# Patient Record
Sex: Female | Born: 1970
Health system: Southern US, Community
[De-identification: ages and names within clinical notes are randomized; demographics above are authoritative.]

## PROBLEM LIST (undated history)

## (undated) DIAGNOSIS — I341 Nonrheumatic mitral (valve) prolapse: Secondary | ICD-10-CM

## (undated) DIAGNOSIS — Z87442 Personal history of urinary calculi: Secondary | ICD-10-CM

## (undated) DIAGNOSIS — I10 Essential (primary) hypertension: Secondary | ICD-10-CM

## (undated) DIAGNOSIS — E282 Polycystic ovarian syndrome: Secondary | ICD-10-CM

## (undated) DIAGNOSIS — M1990 Unspecified osteoarthritis, unspecified site: Secondary | ICD-10-CM

## (undated) DIAGNOSIS — T7840XA Allergy, unspecified, initial encounter: Secondary | ICD-10-CM

## (undated) DIAGNOSIS — E119 Type 2 diabetes mellitus without complications: Secondary | ICD-10-CM

## (undated) DIAGNOSIS — K219 Gastro-esophageal reflux disease without esophagitis: Secondary | ICD-10-CM

## (undated) HISTORY — PX: KNEE SURGERY: SHX244

## (undated) HISTORY — PX: TUBAL LIGATION: SHX77

## (undated) HISTORY — PX: ROTATOR CUFF REPAIR: SHX139

## (undated) HISTORY — DX: Allergy, unspecified, initial encounter: T78.40XA

## (undated) HISTORY — DX: Type 2 diabetes mellitus without complications: E11.9

## (undated) HISTORY — PX: OTHER SURGICAL HISTORY: SHX169

## (undated) HISTORY — DX: Essential (primary) hypertension: I10

---

## 2004-09-15 ENCOUNTER — Ambulatory Visit: Payer: Self-pay | Admitting: Urology

## 2005-12-09 ENCOUNTER — Ambulatory Visit: Payer: Self-pay | Admitting: Unknown Physician Specialty

## 2006-05-11 ENCOUNTER — Ambulatory Visit: Payer: Self-pay | Admitting: Family Medicine

## 2006-05-31 ENCOUNTER — Ambulatory Visit: Payer: Self-pay | Admitting: Family Medicine

## 2006-05-31 LAB — CONVERTED CEMR LAB
CO2: 27 meq/L (ref 19–32)
Sodium: 140 meq/L (ref 135–145)
Total CHOL/HDL Ratio: 5.8
VLDL: 112 mg/dL — ABNORMAL HIGH (ref 0–40)

## 2006-06-16 ENCOUNTER — Ambulatory Visit: Payer: Self-pay | Admitting: Family Medicine

## 2007-03-06 ENCOUNTER — Ambulatory Visit: Payer: Self-pay | Admitting: Family Medicine

## 2007-03-06 DIAGNOSIS — H6980 Other specified disorders of Eustachian tube, unspecified ear: Secondary | ICD-10-CM | POA: Insufficient documentation

## 2007-03-06 DIAGNOSIS — J309 Allergic rhinitis, unspecified: Secondary | ICD-10-CM | POA: Insufficient documentation

## 2007-03-13 ENCOUNTER — Telehealth: Payer: Self-pay | Admitting: Family Medicine

## 2007-04-14 ENCOUNTER — Ambulatory Visit: Payer: Self-pay | Admitting: Obstetrics & Gynecology

## 2007-04-26 ENCOUNTER — Ambulatory Visit: Payer: Self-pay | Admitting: Family Medicine

## 2007-04-28 ENCOUNTER — Telehealth: Payer: Self-pay | Admitting: Family Medicine

## 2007-07-05 ENCOUNTER — Telehealth (INDEPENDENT_AMBULATORY_CARE_PROVIDER_SITE_OTHER): Payer: Self-pay | Admitting: *Deleted

## 2007-09-25 ENCOUNTER — Ambulatory Visit: Payer: Self-pay | Admitting: Cardiology

## 2007-09-25 ENCOUNTER — Other Ambulatory Visit: Payer: Self-pay

## 2007-09-25 ENCOUNTER — Encounter: Payer: Self-pay | Admitting: Maternal & Fetal Medicine

## 2007-10-09 ENCOUNTER — Encounter: Payer: Self-pay | Admitting: Maternal & Fetal Medicine

## 2007-11-06 ENCOUNTER — Ambulatory Visit: Payer: Self-pay | Admitting: Unknown Physician Specialty

## 2007-11-13 ENCOUNTER — Encounter: Payer: Self-pay | Admitting: Maternal & Fetal Medicine

## 2007-11-20 ENCOUNTER — Encounter: Payer: Self-pay | Admitting: Maternal & Fetal Medicine

## 2007-12-07 ENCOUNTER — Encounter: Payer: Self-pay | Admitting: Maternal & Fetal Medicine

## 2007-12-21 ENCOUNTER — Encounter: Payer: Self-pay | Admitting: Maternal and Fetal Medicine

## 2008-01-18 ENCOUNTER — Encounter: Payer: Self-pay | Admitting: Maternal & Fetal Medicine

## 2008-02-12 ENCOUNTER — Encounter: Payer: Self-pay | Admitting: Maternal & Fetal Medicine

## 2008-02-13 ENCOUNTER — Observation Stay: Payer: Self-pay | Admitting: Unknown Physician Specialty

## 2008-02-14 ENCOUNTER — Ambulatory Visit: Payer: Self-pay

## 2008-02-15 ENCOUNTER — Ambulatory Visit: Payer: Self-pay

## 2008-02-17 ENCOUNTER — Observation Stay: Payer: Self-pay

## 2008-02-19 ENCOUNTER — Encounter: Payer: Self-pay | Admitting: Maternal and Fetal Medicine

## 2008-02-22 ENCOUNTER — Observation Stay: Payer: Self-pay | Admitting: Unknown Physician Specialty

## 2008-02-26 ENCOUNTER — Inpatient Hospital Stay: Payer: Self-pay | Admitting: Obstetrics and Gynecology

## 2008-04-26 LAB — CONVERTED CEMR LAB: Pap Smear: NORMAL

## 2008-07-15 ENCOUNTER — Ambulatory Visit: Payer: Self-pay | Admitting: Obstetrics and Gynecology

## 2008-07-18 ENCOUNTER — Ambulatory Visit: Payer: Self-pay | Admitting: Obstetrics and Gynecology

## 2008-11-20 ENCOUNTER — Ambulatory Visit: Payer: Self-pay | Admitting: Family Medicine

## 2008-11-20 DIAGNOSIS — I10 Essential (primary) hypertension: Secondary | ICD-10-CM | POA: Insufficient documentation

## 2008-11-20 DIAGNOSIS — I152 Hypertension secondary to endocrine disorders: Secondary | ICD-10-CM | POA: Insufficient documentation

## 2008-12-04 ENCOUNTER — Telehealth: Payer: Self-pay | Admitting: Family Medicine

## 2009-04-16 ENCOUNTER — Telehealth: Payer: Self-pay | Admitting: Family Medicine

## 2009-06-24 ENCOUNTER — Ambulatory Visit: Payer: Self-pay | Admitting: Family Medicine

## 2009-12-30 ENCOUNTER — Ambulatory Visit: Payer: Self-pay | Admitting: Family Medicine

## 2010-01-06 LAB — CONVERTED CEMR LAB
AST: 21 units/L (ref 0–37)
Albumin: 4.3 g/dL (ref 3.5–5.2)
Alkaline Phosphatase: 46 units/L (ref 39–117)
BUN: 13 mg/dL (ref 6–23)
Bilirubin, Direct: 0.2 mg/dL (ref 0.0–0.3)
CO2: 30 meq/L (ref 19–32)
Calcium: 9.6 mg/dL (ref 8.4–10.5)
Creatinine, Ser: 0.7 mg/dL (ref 0.4–1.2)
Total Protein: 6.9 g/dL (ref 6.0–8.3)

## 2010-01-20 ENCOUNTER — Encounter: Payer: Self-pay | Admitting: Family Medicine

## 2010-01-20 ENCOUNTER — Ambulatory Visit: Payer: Self-pay | Admitting: Family Medicine

## 2010-01-23 ENCOUNTER — Encounter (INDEPENDENT_AMBULATORY_CARE_PROVIDER_SITE_OTHER): Payer: Self-pay | Admitting: *Deleted

## 2010-01-23 LAB — HM MAMMOGRAPHY: HM Mammogram: NORMAL

## 2010-04-17 ENCOUNTER — Ambulatory Visit: Payer: Self-pay | Admitting: Internal Medicine

## 2010-04-17 DIAGNOSIS — J020 Streptococcal pharyngitis: Secondary | ICD-10-CM | POA: Insufficient documentation

## 2010-05-26 NOTE — Assessment & Plan Note (Signed)
Summary: REFILL BP MEDICATION/CLE   Vital Signs:  Patient profile:   40 year old female Weight:      186 pounds BMI:     31.06 Temp:     99.2 degrees F oral Pulse rate:   76 / minute Pulse rhythm:   regular BP sitting:   120 / 80  (left arm) Cuff size:   regular  Vitals Entered By: Mervin Hack CMA Duncan Dull) (December 30, 2009 2:37 PM) CC: medication refill, Hypertension Management   Hypertension History:      She denies headache, chest pain, palpitations, dyspnea with exertion, PND, peripheral edema, syncope, and side effects from treatment.  BPs well controlled at home.        Positive major cardiovascular risk factors include hypertension.  Negative major cardiovascular risk factors include female age less than 34 years old.     Problems Prior to Update: 1)  Other Screening Mammogram  (ICD-V76.12) 2)  Hypertension  (ICD-401.9) 3)  Uri  (ICD-465.9) 4)  Eustachian Tube Dysfunction, Bilateral  (ICD-381.81) 5)  Allergic Rhinitis  (ICD-477.9)  Current Medications (verified): 1)  Losartan Potassium-Hctz 50-12.5 Mg Tabs (Losartan Potassium-Hctz) .... Take 1 Tablet By Mouth Once A Day 2)  Multivitamins   Tabs (Multiple Vitamin) .... Take 1 Tablet By Mouth Once A Day  Allergies: 1)  ! Penicillin 2)  Methyldopa (Methyldopa)  Past History:  Past medical, surgical, family and social histories (including risk factors) reviewed, and no changes noted (except as noted below).  Past Medical History: Reviewed history from 11/20/2008 and no changes required. Allergic rhinitis Hypertension  Family History: Reviewed history and no changes required.  Social History: Reviewed history and no changes required.  Review of Systems       left ear.. eustacian tube dysfunction...ENT given nasal steroid and steroid dose pack.  General:  Denies fatigue. CV:  Denies chest pain or discomfort. Resp:  Denies shortness of breath. GI:  Denies abdominal pain. GU:  Denies dysuria.  Physical  Exam  General:  Well-developed,well-nourished,in no acute distress; alert,appropriate and cooperative throughout examination Mouth:  Oral mucosa and oropharynx without lesions or exudates.  Teeth in good repair. Neck:  no carotid bruit or thyromegaly no cervical or supraclavicular lymphadenopathy  Lungs:  Normal respiratory effort, chest expands symmetrically. Lungs are clear to auscultation, no crackles or wheezes. Heart:  Normal rate and regular rhythm. S1 and S2 normal without gallop, murmur, click, rub or other extra sounds. Pulses:  R and L posterior tibial pulses are full and equal bilaterally  Extremities:  no edema    Impression & Recommendations:  Problem # 1:  HYPERTENSION (ICD-401.9) Well controlled. Continue current medication. Due for lab eval. HAs fasting labs yearly at GYN.  Her updated medication list for this problem includes:    Losartan Potassium-hctz 50-12.5 Mg Tabs (Losartan potassium-hctz) .Marland Kitchen... Take 1 tablet by mouth once a day  Orders: TLB-Hepatic/Liver Function Pnl (80076-HEPATIC) TLB-BMP (Basic Metabolic Panel-BMET) (80048-METABOL)  Complete Medication List: 1)  Losartan Potassium-hctz 50-12.5 Mg Tabs (Losartan potassium-hctz) .... Take 1 tablet by mouth once a day 2)  Multivitamins Tabs (Multiple vitamin) .... Take 1 tablet by mouth once a day  Other Orders: Flu Vaccine 25yrs + ([address]109) Admin 1st Vaccine (60454) Admin 1st Vaccine [facility]) [id number]) Radiology Referral (Radiology)  Hypertension Assessment/Plan:      The patient's hypertensive risk group is category A: No risk factors and no target organ damage.  Today's blood pressure is 120/80.  Her blood pressure goal is <  140/90.  Patient Instructions: 1)  Referral Appointment Information 2)  Day/Date: 3)  Time: 4)  Place/MD: 5)  Address: 6)  Phone/Fax: 7)  Patient given appointment information. Information/Orders faxed/mailed.  8)  Call GYN about scheduled CPX..may consider scheduling CPX here at  [facility]. 9)  Have chol and DM screen done fasting at GYN. Prescriptions: LOSARTAN POTASSIUM-HCTZ 50-12.5 MG TABS (LOSARTAN POTASSIUM-HCTZ) Take 1 tablet by mouth once a day  #90 x 3   Entered and Authorized by:   Kerby Nora MD   Signed by:   Kerby Nora MD on 12/30/2009   Method used:   Faxed to ...       [facility] education officer)             , [place]         Ph:        Fax: [phone]   RxID:   [id number] LOSARTAN POTASSIUM-HCTZ 50-12.5 MG TABS (LOSARTAN POTASSIUM-HCTZ) Take 1 tablet by mouth once a day  #30 x 0   Entered and Authorized by:   Kerby Nora MD   Signed by:   Kerby Nora MD on 12/30/2009   Method used:   Faxed to ...       [facility]-order)             , [place]         Ph:        Fax: [phone]   RxID:   [id number] LOSARTAN POTASSIUM-HCTZ 50-12.5 MG TABS (LOSARTAN POTASSIUM-HCTZ) Take 1 tablet by mouth once a day  #30 x 0   Entered and Authorized by:   Kerby Nora MD   Signed by:   Kerby Nora MD on 12/30/2009   Method used:   [facility]        [facility] [phone]* (retail)       [address] [place], [place]  [address]       Ph: [phone] or [phone]       Fax: [phone]   RxID:   [id number]   Current Allergies (reviewed today): ! PENICILLIN METHYLDOPA (METHYLDOPA)  Flu Vaccine Result Date:  12/30/2009 Flu Vaccine Result:  given Flu Vaccine Next Due:  1 yr      Influenza Vaccine    Vaccine Type: Fluvax 3+    Site: left deltoid    Mfr: GlaxoSmithKline    Dose: 0.5 ml    Route: IM    Given by: Mervin Hack CMA (AAMA)    Exp. Date: 10/24/2010    Lot #: [id number]    VIS given: 11/18/09 version given December 30, 2009.  Flu Vaccine Consent Questions    Do you have a history of severe allergic reactions to this vaccine? no    Any prior history of allergic reactions to egg and/or gelatin? no    Do you have a sensitivity to the preservative Thimersol? no    Do you have a past  history of Guillan-Barre Syndrome? no    Do you currently have an acute febrile illness? no    Have you ever had a severe reaction to latex? no    Vaccine information given and explained to patient? yes    Are you currently pregnant? no

## 2010-05-26 NOTE — Letter (Signed)
Summary: Results Follow up Letter  [place] [facility]  [address] [place], [place] [address]   Phone: [phone]  Fax: [phone]    01/23/2010 MRN: [id number]     [facility] [address] [place], [place]  [address]    Dear Ms. YORK,  The following are the results of your recent test(s):  Test         Result    Pap Smear:        Normal _____  Not Normal _____ Comments: ______________________________________________________ Cholesterol: LDL(Bad cholesterol):         Your goal is less than:         HDL (Good cholesterol):       Your goal is more than: Comments:  ______________________________________________________ Mammogram:        Normal __x___  Not Normal _____ Comments:Repeat in 1 year   ___________________________________________________________________ Hemoccult:        Normal _____  Not normal _______ Comments:    _____________________________________________________________________ Other Tests:    We routinely do not discuss normal results over the telephone.  If you desire a copy of the results, or you have any questions about this information we can discuss them at your next office visit.   Sincerely,  Kerby Nora MD

## 2010-05-28 NOTE — Assessment & Plan Note (Signed)
Summary: ST,FEVER/CLE   Vital Signs:  Patient profile:   41 year old female Weight:      187 pounds Temp:     99.3 degrees F oral Pulse rate:   88 / minute Pulse rhythm:   regular BP sitting:   120 / 82  (left arm) Cuff size:   regular  Vitals Entered By: Selena Batten Dance CMA Duncan Dull) (April 17, 2010 11:16 AM) CC: Sore throat and fever   History of Present Illness: CC: ST, fever  this morning woke up with temperature to 99.2 and R sided ST.  + mild cough yellow sputum.  No abd pain, HA, congestion, n/v/d, rashes, myalgia/arthralgia.  No sick contacts around her.  + daughter is strep throat carrier.    No smokers at home.  Current Medications (verified): 1)  Losartan Potassium-Hctz 50-12.5 Mg Tabs (Losartan Potassium-Hctz) .... Take 1 Tablet By Mouth Once A Day 2)  Multivitamins   Tabs (Multiple Vitamin) .... Take 1 Tablet By Mouth Once A Day  Allergies: 1)  ! Penicillin 2)  Methyldopa (Methyldopa)  Past History:  Past Medical History: Last updated: 11/20/2008 Allergic rhinitis Hypertension  Social History: nonsmoker  Review of Systems       per HPI  Physical Exam  General:  Well-developed,well-nourished,in no acute distress; alert,appropriate and cooperative throughout examination Head:  Normocephalic and atraumatic without obvious abnormalities. No apparent alopecia or balding. Eyes:  PERRLA, EOMI Ears:  TMs congested bilaterally, no erythema  Nose:  nares clear bilaterally Mouth:  swollen tonsils, and erythematous pharynx, no exudates Neck:  R AC LAD Lungs:  Normal respiratory effort, chest expands symmetrically. Lungs are clear to auscultation, no crackles or wheezes. Heart:  Normal rate and regular rhythm. S1 and S2 normal without gallop, murmur, click, rub or other extra sounds. Pulses:  R and L posterior tibial pulses are full and equal bilaterally  Extremities:  no edema    Impression & Recommendations:  Problem # 1:  STREPTOCOCCAL PHARYNGITIS  (ICD-034.0) see instructions.  treat with zpack - pcn allergy. Her updated medication list for this problem includes:    Zithromax Z-pak 250 Mg Tabs (Azithromycin) .Marland Kitchen... Take as directed  Orders: Rapid Strep (16109)  Complete Medication List: 1)  Losartan Potassium-hctz 50-12.5 Mg Tabs (Losartan potassium-hctz) .... Take 1 tablet by mouth once a day 2)  Multivitamins Tabs (Multiple vitamin) .... Take 1 tablet by mouth once a day 3)  Zithromax Z-pak 250 Mg Tabs (Azithromycin) .... Take as directed  Patient Instructions: 1)  Looks like strep. 2)  Take ibuprofen for throat inflammation. 3)  Suck on cold things like popsicles or warm things like herbal teas (whichever soothes your throat better). 4)  Salt water gargles, consider throat lozenges/chloraseptic. 5)  azithromycin for strep. 6)  return if you are not improving as expected, or if you have high fevers (>101.5) or difficulty swallowing. 7)  Call clinic with questions.  Pleasure to see you today.  Prescriptions: ZITHROMAX Z-PAK 250 MG TABS (AZITHROMYCIN) take as directed  #1 x 0   Entered and Authorized by:   Eustaquio Boyden  MD   Signed by:   Eustaquio Boyden  MD on 04/17/2010   Method used:   [facility]        [facility] [phone]* (retail)       [address]       [place], [place]  [address]       Ph:  [phone] or [phone]       Fax: [phone]   RxID:   [id number]    Orders Added: 1)  Est. Patient Level III [44010] 2)  Rapid Strep [27253]    Current Allergies (reviewed today): ! PENICILLIN METHYLDOPA (METHYLDOPA)  Laboratory Results  Date/Time Received: April 17, 2010 11:18 AM  Date/Time Reported: April 17, 2010 11:18 AM   Other Tests  Rapid Strep: positive

## 2010-06-15 ENCOUNTER — Telehealth: Payer: Self-pay | Admitting: Family Medicine

## 2010-06-23 NOTE — Progress Notes (Signed)
Summary: call a nurse   Phone Note Call from Patient   Summary of Call: Triage Record Num: [id number] Operator: Hillary Bow Patient Name: Alyssa Warren Call Date & Time: 06/13/2010 11:31:56AM Patient Phone: [phone] PCP: Kerby Nora Patient Gender: Female PCP Fax : [phone] Patient DOB: [birth year] [facility] Name[facility] Reason for Call: Pt calling about having burning, frequent urination, streak of blood in urine and  L flank pain. Afebrile. Advised Pt to go UC for eval d/t unbearable L flank pain w/ blood in urine. Home care advice given. Protocol(s) Used: Bloody Urine Recommended Outcome per Protocol: See ED Immediately  Triage Record Num: [id number] Operator: April Finney Patient Name: Alyssa Warren Call Date & Time: 06/13/2010 10:13:01AM Patient Phone: [phone] PCP: Kerby Nora Patient Gender: Female PCP Fax : [phone] Patient DOB: 10-24-1970 [facility] Name[facility] Reason for Call: [name] and daughter answered and said she was not available to talke. Instructed her to tell her to call back when she is avaialble. Verbalized understanding. Protocol(s) Used: Office Note Recommended Outcome per Protocol: Information Noted and Sent to Office Reason for Outcome: Caller information to office Care Advice:  ~ 02/ Reason for Outcome: Unbearable flank, low back or lower abdominal pain Care Advice:  ~ 02/    Initial call taken by: Melody Comas,  June 15, 2010 8:10 AM

## 2010-07-22 ENCOUNTER — Other Ambulatory Visit: Payer: Self-pay | Admitting: Family Medicine

## 2010-09-11 NOTE — Assessment & Plan Note (Signed)
[facility]                           [place] OFFICE NOTE   Alyssa, Warren                           MRN:          [id number]  DATE:05/11/2006                            DOB:          29-Jun-1970    CHIEF COMPLAINT:  A 40 year old female here to establish new doctor.   HISTORY OF PRESENT ILLNESS:  [name] is changing her physician from Dr.  Patrecia Pace to [facility].  She has the following chronic issues:  1. Hypertension, well-controlled:  She states that her blood pressure      usually ranges 120 to 80 at home or in an [employer] blood pressure      meter.  She states that it is frequently elevated at the doctor's      office and she feels she has white coat syndrome.  She is      interested in changing her medication to a blood pressure medicine      that is safe in pregnancy, given the fact that she is interested in      getting pregnant and has been discussing this with her OB/GYN.  2. Obesity:  She has been having difficulty with weight loss.  She      would like to discuss diet and exercise.  She does have a history      of polycystic ovarian syndrome and was previously on metformin for      approximately 3 months last year but did not lose any weight.  She      is currently having regular periods and feels that she is fertile      and does not want to retry metformin.   REVIEW OF SYSTEMS:  Otherwise negative.   PAST MEDICAL HISTORY:  1. Hypertension.  2. PCOS.  3. Depression.  4. History of abnormal Pap.   HOSPITALIZATIONS, SURGERIES, PROCEDURES:  1. 1975 bladder/kidney surgery for recurrent UTIs as a child.  2. 1995 meniscal tear left knee, resulting in surgery.  3. 2001 NSVD.  4. 2004 and 2006 bilateral foot surgeries for plantar fasciitis.  5. 2001 echocardiogram showing mitral valve prolapse.  6. Ovarian cyst removal and rupture x2.  7. Colposcopy for abnormal Pap.  Cervix frozen.  8. Stress test negative.  9.  Mammogram August 2007 negative.  10.Pap smear June 2007 negative.   ALLERGIES:  To PENICILLIN.   MEDICATIONS:  1. InnoPran XL 80 mg 1 p.o. daily.  2. Multivitamin daily.   SOCIAL HISTORY:  No tobacco, no alcohol, and no drug use. 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  She is married. She  has 1 daughter who is healthy, age 16.  She walks 2 times per week.  Her  diet has improved some with no fast foods, but she only gets occasional  fruit.  She does try to eat lots of salads.  She states that she drinks  soft drinks, Dr. Reino Kent daily.   FAMILY HISTORY:  Father alive at age 29 with high cholesterol and high  blood pressure.  Mother alive at  age 71 with high cholesterol.  No  family history of MI before age 52.  She has 2 brothers who are healthy.  No family history of diabetes.  She has 2 great aunts who developed  breast cancer in age 13.  There is another great aunt who had 3 types of  cancer, but she is unsure of what type.  No specific history of colon  cancer, ovarian or uterine cancer that she is aware of.   PHYSICAL EXAMINATION:  VITAL SIGNS:  Height 66-1/4 inches.  Weight  195.8.  Blood pressure 130/92, pulse 88, temperature 98.3.  GENERAL:  Overweight appearing female in no apparent distress.  HEENT:  PERRLA, extraocular muscles intact.  Oropharynx clear.  Tympanic  membranes clear.  Nares clear.  No thyromegaly, no lymphadenopathy,  supraclavicular or cervical.  CARDIOVASCULAR:  Regular rate and rhythm.  No murmurs, rubs or gallops.  Normal PMI.  There are 2+ peripheral pulses, no peripheral edema.  LUNGS:  Clear to auscultation bilaterally.  No wheezes, rales or  rhonchi.  ABDOMEN:  Soft, nontender.  Normoactive bowel sounds.  No  hepatosplenomegaly.  MUSCULOSKELETAL:  Strength 5/5 in upper and lower extremities.  NEURO:  Cranial nerves II through XII grossly intact.  Alert and  oriented x3.   ASSESSMENT AND PLAN:  1. Hypertension,  well-controlled:  We will stop her InnoPran XL 80 mg      daily because it is class C in pregnancy.  We will instead change      her to methyldopa 250 mg p.o. b.i.d.  This was called in via doctor      first [facility] a [facility]'s supply [facility], followed by a      year's supple sent to [facility].  2. Obesity, BMI 31:  We discussed weight loss and exercise techniques      for approximately 15 minutes.  She was instructed to decrease      liquid calories by eliminating sodas from her diet and increasing      water.  She will try and manage portion sizes.  She will try and      increase the exercise in her life to more than just 2 times per      week and will try to include her daughter.  Because of the      polycystic ovarian syndrome, for fertility, she may need to return      to her OB/GYN if her periods do not continue to be regular for      consideration of metformin.  3. Prevention:  She is up-to-date currently with prevention.  I will      obtain records from previous doctor to determine when cholesterol      was last checked.     Kerby Nora, MD  Electronically Signed    AB/MedQ  DD: 05/11/2006  DT: 05/12/2006  Job #: [id number]

## 2010-10-29 ENCOUNTER — Telehealth: Payer: Self-pay | Admitting: *Deleted

## 2010-10-29 NOTE — Telephone Encounter (Signed)
Pt is requesting that you call her.  She says she has something urgent and very personal to discuss with you. I told her you are out until Friday, she said that would be ok.

## 2010-10-30 NOTE — Telephone Encounter (Signed)
Spoke with patient and appt made for cpe on 11-03-2010

## 2010-10-30 NOTE — Telephone Encounter (Signed)
Needs an appointment given husband has been having an affair...she is interested in STD testing. She is overdue for CPX. No SI, no HI. Separated since 6/15.  Heather please call her to work her in for CPX as soon as we can fit her in (put 2 fifteen minute slots together... By end of next week if possible. Okay to leave voicemail.. Can come in anytime.

## 2010-11-02 ENCOUNTER — Encounter: Payer: Self-pay | Admitting: Family Medicine

## 2010-11-03 ENCOUNTER — Other Ambulatory Visit (HOSPITAL_COMMUNITY)
Admission: RE | Admit: 2010-11-03 | Discharge: 2010-11-03 | Disposition: A | Discharge status: Home / Self Care | Payer: 59 | Source: Ambulatory Visit | Attending: Family Medicine | Admitting: Family Medicine

## 2010-11-03 ENCOUNTER — Encounter: Payer: Self-pay | Admitting: Family Medicine

## 2010-11-03 ENCOUNTER — Telehealth: Payer: Self-pay | Admitting: Family Medicine

## 2010-11-03 ENCOUNTER — Ambulatory Visit (INDEPENDENT_AMBULATORY_CARE_PROVIDER_SITE_OTHER): Payer: 59 | Admitting: Family Medicine

## 2010-11-03 DIAGNOSIS — Z9189 Other specified personal risk factors, not elsewhere classified: Secondary | ICD-10-CM

## 2010-11-03 DIAGNOSIS — Z01419 Encounter for gynecological examination (general) (routine) without abnormal findings: Secondary | ICD-10-CM

## 2010-11-03 DIAGNOSIS — R739 Hyperglycemia, unspecified: Secondary | ICD-10-CM

## 2010-11-03 DIAGNOSIS — I10 Essential (primary) hypertension: Secondary | ICD-10-CM

## 2010-11-03 DIAGNOSIS — Z1159 Encounter for screening for other viral diseases: Secondary | ICD-10-CM | POA: Insufficient documentation

## 2010-11-03 DIAGNOSIS — R7401 Elevation of levels of liver transaminase levels: Secondary | ICD-10-CM

## 2010-11-03 DIAGNOSIS — E781 Pure hyperglyceridemia: Secondary | ICD-10-CM

## 2010-11-03 DIAGNOSIS — Z202 Contact with and (suspected) exposure to infections with a predominantly sexual mode of transmission: Secondary | ICD-10-CM

## 2010-11-03 DIAGNOSIS — Z113 Encounter for screening for infections with a predominantly sexual mode of transmission: Secondary | ICD-10-CM | POA: Insufficient documentation

## 2010-11-03 DIAGNOSIS — Z0000 Encounter for general adult medical examination without abnormal findings: Secondary | ICD-10-CM

## 2010-11-03 DIAGNOSIS — Z1231 Encounter for screening mammogram for malignant neoplasm of breast: Secondary | ICD-10-CM

## 2010-11-03 LAB — COMPREHENSIVE METABOLIC PANEL
ALT: 53 U/L — ABNORMAL HIGH (ref 0–35)
AST: 47 U/L — ABNORMAL HIGH (ref 0–37)
Albumin: 4.9 g/dL (ref 3.5–5.2)
Calcium: 9.4 mg/dL (ref 8.4–10.5)
Chloride: 108 mEq/L (ref 96–112)
Creatinine, Ser: 0.8 mg/dL (ref 0.4–1.2)
Potassium: 4.1 mEq/L (ref 3.5–5.1)
Sodium: 142 mEq/L (ref 135–145)
Total Protein: 7.7 g/dL (ref 6.0–8.3)

## 2010-11-03 LAB — LIPID PANEL
Total CHOL/HDL Ratio: 5
VLDL: 75.8 mg/dL — ABNORMAL HIGH (ref 0.0–40.0)

## 2010-11-03 LAB — RPR

## 2010-11-03 MED ORDER — ALPRAZOLAM 0.25 MG PO TABS: 0.2500 mg | ORAL_TABLET | Freq: Every day | ORAL | Status: AC | PRN

## 2010-11-03 MED ORDER — ALPRAZOLAM 0.25 MG PO TABS: 0.2500 mg | ORAL_TABLET | Freq: Every day | ORAL | Status: DC | PRN

## 2010-11-03 NOTE — Telephone Encounter (Signed)
Notify pt that her blood sugar was up but as she had orange juice earlier in the day this is not a true fasting. Also her triglycerides are up which may reflect orange juice some.  The remainedr of her cholesterol panel is normal. Liver function tests were slightly elevated... She is not on any medications that would increase this, acute hep panel pending.  Have her stop all alcohol, tylenol and any over there counter supplements and  return for recheck liver function tests in 2 weeks. At that time also she can fast completely and we can recehck glucose and triglycerides alone.

## 2010-11-03 NOTE — Progress Notes (Signed)
Subjective:    Patient ID: Alyssa Warren, female    DOB: April 14, 1971, 40 y.o.   MRN: [id number]  HPI The patient is here for annual wellness exam and preventative care.    Hypertension:  Well controlled on Hyzaar with weight loss  Using medication without problems or lightheadedness:  Chest pain with exertion: None Edema:None Short of breath:None Average home BPs: 110/70 Other issues:   Recent infidelity of husband , pt interested in complete STD screening.  They are separated... He left 6/15 Has lost 20-30 lb over last month and a half. She has been fairly anxious, gradually worsening.  Trouble sleeping due to frequent sleeping. Does not want sleep medication due to having 41 year old in the house.   She is seeing counselor weekly in past month.   Review of Systems  Constitutional: Negative for fever and fatigue.  HENT: Negative for congestion.   Eyes: Negative for pain.  Respiratory: Negative for shortness of breath and wheezing.   Cardiovascular: Positive for palpitations. Negative for chest pain and leg swelling.       When under stress, regular  Gastrointestinal: Positive for diarrhea. Negative for nausea.       Due to stress  Genitourinary: Negative for dysuria, vaginal bleeding, vaginal discharge, vaginal pain and pelvic pain.       No breast lesion  Musculoskeletal: Negative for back pain.  Neurological: Negative for syncope and weakness.  Psychiatric/Behavioral: Negative for suicidal ideas. The patient is nervous/anxious.        Objective:   Physical Exam  Constitutional: Vital signs are normal. She appears well-developed and well-nourished. She is cooperative.  Non-toxic appearance. She does not appear ill. No distress.  HENT:  Head: Normocephalic.  Right Ear: Hearing, tympanic membrane, external ear and ear canal normal.  Left Ear: Hearing, tympanic membrane, external ear and ear canal normal.  Nose: Nose normal.  Eyes: Conjunctivae, EOM and lids are normal.  Pupils are equal, round, and reactive to light. No foreign bodies found.  Neck: Trachea normal and normal range of motion. Neck supple. Carotid bruit is not present. No mass and no thyromegaly present.  Cardiovascular: Normal rate, regular rhythm, S1 normal, S2 normal, normal heart sounds and intact distal pulses.  Exam reveals no gallop.   No murmur heard. Pulmonary/Chest: Effort normal and breath sounds normal. No respiratory distress. She has no wheezes. She has no rhonchi. She has no rales.  Abdominal: Soft. Normal appearance and bowel sounds are normal. She exhibits no distension, no fluid wave, no abdominal bruit and no mass. There is no hepatosplenomegaly. There is no tenderness. There is no rebound, no guarding and no CVA tenderness. No hernia.  Genitourinary: Rectum normal, vagina normal and uterus normal. Rectal exam shows no external hemorrhoid, no internal hemorrhoid, no fissure, no mass, no tenderness and anal tone normal. Guaiac negative stool. No breast swelling, tenderness, discharge or bleeding. Pelvic exam was performed with patient prone. There is no rash, tenderness or lesion on the right labia. There is no rash, tenderness or lesion on the left labia. Uterus is not enlarged and not tender. Cervix exhibits no motion tenderness, no discharge and no friability. Right adnexum displays no mass, no tenderness and no fullness. Left adnexum displays no mass, no tenderness and no fullness.  Lymphadenopathy:    She has no cervical adenopathy.    She has no axillary adenopathy.  Neurological: She is alert. She has normal strength. No cranial nerve deficit or sensory deficit.  Skin: Skin is  warm, dry and intact. No rash noted.  Psychiatric: Her speech is normal and behavior is normal. Judgment normal. Her mood appears not anxious. Cognition and memory are normal. She does not exhibit a depressed mood.          Assessment & Plan:

## 2010-11-03 NOTE — Patient Instructions (Signed)
Use xanax as needed for anxiety, limit use. If symptms persisting past 2 months on a regualr basisi please make appt to discuss other medication. Call sooner if needed. We will call with lab results on cell.

## 2010-11-03 NOTE — Progress Notes (Signed)
Addended by: Kerby Nora E on: 11/03/2010 09:19 AM   Modules accepted: Orders

## 2010-11-04 LAB — HEPATITIS PANEL, ACUTE
Hep B C IgM: NEGATIVE
Hepatitis B Surface Ag: NEGATIVE

## 2010-11-04 LAB — HIV ANTIBODY (ROUTINE TESTING W REFLEX): HIV: NONREACTIVE

## 2010-11-05 NOTE — Telephone Encounter (Signed)
Patient advised and appt made

## 2010-11-10 ENCOUNTER — Encounter: Payer: Self-pay | Admitting: *Deleted

## 2010-11-17 ENCOUNTER — Other Ambulatory Visit: Payer: 59

## 2010-11-18 ENCOUNTER — Other Ambulatory Visit (INDEPENDENT_AMBULATORY_CARE_PROVIDER_SITE_OTHER): Payer: 59 | Admitting: Family Medicine

## 2010-11-18 ENCOUNTER — Telehealth: Payer: Self-pay | Admitting: Family Medicine

## 2010-11-18 DIAGNOSIS — R7303 Prediabetes: Secondary | ICD-10-CM

## 2010-11-18 DIAGNOSIS — R7401 Elevation of levels of liver transaminase levels: Secondary | ICD-10-CM

## 2010-11-18 DIAGNOSIS — R739 Hyperglycemia, unspecified: Secondary | ICD-10-CM

## 2010-11-18 DIAGNOSIS — R7309 Other abnormal glucose: Secondary | ICD-10-CM

## 2010-11-18 DIAGNOSIS — E781 Pure hyperglyceridemia: Secondary | ICD-10-CM

## 2010-11-18 LAB — HEPATIC FUNCTION PANEL
ALT: 33 U/L (ref 0–35)
AST: 26 U/L (ref 0–37)
Alkaline Phosphatase: 43 U/L (ref 39–117)
Bilirubin, Direct: 0.1 mg/dL (ref 0.0–0.3)
Total Bilirubin: 1 mg/dL (ref 0.3–1.2)

## 2010-11-18 NOTE — Telephone Encounter (Signed)
Liver function is back in nml range. Triglycerides remain high but fasting blood sugar is improved although still in prediabetes range. Send info on prediabetes.  Work on [employer], weight loss and low carb diet.  Start fish oil 2000 mg divided daily.  Scheduled for recheck lipids and glucose in 3 months.

## 2010-11-18 NOTE — Telephone Encounter (Signed)
Patient advised and information sent to patient

## 2010-12-24 ENCOUNTER — Other Ambulatory Visit: Payer: Self-pay | Admitting: *Deleted

## 2010-12-24 MED ORDER — ALPRAZOLAM 0.25 MG PO TABS: 0.2500 mg | ORAL_TABLET | Freq: Every day | ORAL | Status: DC | PRN

## 2010-12-24 NOTE — Telephone Encounter (Signed)
Last filled 11-03-2010.

## 2010-12-25 NOTE — Telephone Encounter (Signed)
rx called to pharmacy 

## 2011-01-26 ENCOUNTER — Encounter: Payer: Self-pay | Admitting: Family Medicine

## 2011-01-26 ENCOUNTER — Ambulatory Visit: Payer: Self-pay | Admitting: Family Medicine

## 2011-01-27 ENCOUNTER — Encounter: Payer: Self-pay | Admitting: *Deleted

## 2011-02-15 ENCOUNTER — Other Ambulatory Visit: Payer: Self-pay | Admitting: Family Medicine

## 2011-04-01 ENCOUNTER — Other Ambulatory Visit: Payer: Self-pay | Admitting: *Deleted

## 2011-04-01 MED ORDER — ALPRAZOLAM 0.25 MG PO TABS: 0.2500 mg | ORAL_TABLET | Freq: Every day | ORAL | Status: DC | PRN

## 2011-04-01 NOTE — Telephone Encounter (Signed)
rx called to pharmacy 

## 2011-05-13 ENCOUNTER — Other Ambulatory Visit: Payer: Self-pay

## 2011-05-13 NOTE — Telephone Encounter (Signed)
Pt left v/m that insurance has changed 04/27/11 and pt has to use [facility] and get 90 day supply for medications but rx can be picked up at local [facility]. [facility] [address] and was told pt has refills there and they will fill for 90 day supply. Patient notified as instructed by telephone. Pt said she would call back because she is due for appt in spring.

## 2011-08-09 ENCOUNTER — Other Ambulatory Visit: Payer: Self-pay | Admitting: *Deleted

## 2011-08-09 MED ORDER — LOSARTAN POTASSIUM-HCTZ 50-12.5 MG PO TABS: 1.0000 | ORAL_TABLET | Freq: Every day | ORAL | Status: DC

## 2011-08-09 NOTE — Telephone Encounter (Signed)
Received faxed refill request from pharmacy. Refill sent to pharmacy electronically. 

## 2011-09-15 ENCOUNTER — Other Ambulatory Visit: Payer: Self-pay | Admitting: *Deleted

## 2011-09-16 MED ORDER — ALPRAZOLAM 0.25 MG PO TABS: 0.2500 mg | ORAL_TABLET | Freq: Every day | ORAL | Status: DC | PRN

## 2011-09-16 NOTE — Telephone Encounter (Signed)
LAst ov 7/12, last refill 12/12

## 2011-09-16 NOTE — Telephone Encounter (Signed)
rx called to pharmacy 

## 2011-10-12 ENCOUNTER — Other Ambulatory Visit: Payer: Self-pay | Admitting: *Deleted

## 2011-10-12 MED ORDER — LOSARTAN POTASSIUM-HCTZ 50-12.5 MG PO TABS: 1.0000 | ORAL_TABLET | Freq: Every day | ORAL | Status: DC

## 2011-11-18 ENCOUNTER — Telehealth: Payer: Self-pay | Admitting: Family Medicine

## 2011-11-18 DIAGNOSIS — Z1231 Encounter for screening mammogram for malignant neoplasm of breast: Secondary | ICD-10-CM

## 2011-11-18 NOTE — Telephone Encounter (Signed)
Pt is wanting to know if she could have an order put in for her Mammo before her CPE is scheduled. She is due in September.

## 2012-01-04 ENCOUNTER — Other Ambulatory Visit: Payer: Self-pay | Admitting: Family Medicine

## 2012-02-04 ENCOUNTER — Encounter: Payer: 59 | Admitting: Family Medicine

## 2012-02-25 ENCOUNTER — Telehealth: Payer: Self-pay | Admitting: Family Medicine

## 2012-02-25 ENCOUNTER — Other Ambulatory Visit (INDEPENDENT_AMBULATORY_CARE_PROVIDER_SITE_OTHER): Payer: 59

## 2012-02-25 DIAGNOSIS — R7303 Prediabetes: Secondary | ICD-10-CM

## 2012-02-25 DIAGNOSIS — I10 Essential (primary) hypertension: Secondary | ICD-10-CM

## 2012-02-25 DIAGNOSIS — E781 Pure hyperglyceridemia: Secondary | ICD-10-CM

## 2012-02-25 DIAGNOSIS — R7309 Other abnormal glucose: Secondary | ICD-10-CM

## 2012-02-25 HISTORY — PX: BREAST BIOPSY: SHX20

## 2012-02-25 LAB — COMPREHENSIVE METABOLIC PANEL
ALT: 13 U/L (ref 0–35)
AST: 18 U/L (ref 0–37)
Albumin: 4.2 g/dL (ref 3.5–5.2)
Alkaline Phosphatase: 45 U/L (ref 39–117)
BUN: 14 mg/dL (ref 6–23)
CO2: 29 mEq/L (ref 19–32)
Calcium: 9.4 mg/dL (ref 8.4–10.5)
Chloride: 103 mEq/L (ref 96–112)
Creatinine, Ser: 0.8 mg/dL (ref 0.4–1.2)
GFR: 86.04 mL/min (ref >60.00)
Glucose, Bld: 112 mg/dL — ABNORMAL HIGH (ref 70–99)
Potassium: 3.9 mEq/L (ref 3.5–5.1)
Sodium: 141 mEq/L (ref 135–145)
Total Bilirubin: 1.1 mg/dL (ref 0.3–1.2)
Total Protein: 6.7 g/dL (ref 6.0–8.3)

## 2012-02-25 LAB — LIPID PANEL
Cholesterol: 207 mg/dL — ABNORMAL HIGH (ref 0–200)
HDL: 35.8 mg/dL — ABNORMAL LOW (ref >39.00)
Total CHOL/HDL Ratio: 6
Triglycerides: 278 mg/dL — ABNORMAL HIGH (ref 0.0–149.0)
VLDL: 55.6 mg/dL — ABNORMAL HIGH (ref 0.0–40.0)

## 2012-02-25 LAB — LDL CHOLESTEROL, DIRECT: Direct LDL: 130 mg/dL

## 2012-02-25 NOTE — Telephone Encounter (Signed)
Lab visit

## 2012-03-02 ENCOUNTER — Ambulatory Visit: Payer: Self-pay | Admitting: Family Medicine

## 2012-03-02 ENCOUNTER — Encounter: Payer: Self-pay | Admitting: Family Medicine

## 2012-03-02 ENCOUNTER — Ambulatory Visit (INDEPENDENT_AMBULATORY_CARE_PROVIDER_SITE_OTHER): Payer: 59 | Admitting: Family Medicine

## 2012-03-02 VITALS — BP 120/72 | HR 96 | Temp 99.0°F | Ht 66.0 in | Wt 171.5 lb

## 2012-03-02 DIAGNOSIS — I10 Essential (primary) hypertension: Secondary | ICD-10-CM

## 2012-03-02 DIAGNOSIS — Z01419 Encounter for gynecological examination (general) (routine) without abnormal findings: Secondary | ICD-10-CM

## 2012-03-02 DIAGNOSIS — Z0000 Encounter for general adult medical examination without abnormal findings: Secondary | ICD-10-CM

## 2012-03-02 DIAGNOSIS — E781 Pure hyperglyceridemia: Secondary | ICD-10-CM

## 2012-03-02 DIAGNOSIS — F329 Major depressive disorder, single episode, unspecified: Secondary | ICD-10-CM

## 2012-03-02 DIAGNOSIS — F32A Depression, unspecified: Secondary | ICD-10-CM

## 2012-03-02 DIAGNOSIS — F320 Major depressive disorder, single episode, mild: Secondary | ICD-10-CM | POA: Insufficient documentation

## 2012-03-02 DIAGNOSIS — Z23 Encounter for immunization: Secondary | ICD-10-CM

## 2012-03-02 DIAGNOSIS — F341 Dysthymic disorder: Secondary | ICD-10-CM

## 2012-03-02 DIAGNOSIS — R7309 Other abnormal glucose: Secondary | ICD-10-CM

## 2012-03-02 DIAGNOSIS — R7303 Prediabetes: Secondary | ICD-10-CM

## 2012-03-02 LAB — HM MAMMOGRAPHY

## 2012-03-02 MED ORDER — VENLAFAXINE HCL ER 37.5 MG PO TB24: ORAL_TABLET | ORAL | Status: DC

## 2012-03-02 NOTE — Assessment & Plan Note (Signed)
Improved but not at goal. Start fish oil.

## 2012-03-02 NOTE — Assessment & Plan Note (Signed)
Encouraged exercise, weight loss, healthy eating habits. ? ?
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
## 2012-03-02 NOTE — Assessment & Plan Note (Signed)
Well controlled. Continue current medication.  

## 2012-03-02 NOTE — Assessment & Plan Note (Signed)
Start effexor XR. Consider tramadol for sleep if not improving over next few weeks.  Stop alprazolam as not helping. Follow up in 45month

## 2012-03-02 NOTE — Addendum Note (Signed)
Addended by: Consuello Masse on: 03/02/2012 03:50 PM   Modules accepted: Orders

## 2012-03-02 NOTE — Patient Instructions (Addendum)
Start effexor XR. Follow up in 1 month for mood. Stop alprazolam. Call sooner if sleep is not improving to consider sleep medication such as trazodone.  Start fish oil/ flax seed oil (omega 3 fatty acids ALA DHA or EPA) 2000 mg divided daily. Get back on track with diet and exercise. Avoid sweet beverages, carbs.

## 2012-03-03 ENCOUNTER — Encounter: Payer: Self-pay | Admitting: Family Medicine

## 2012-03-07 ENCOUNTER — Ambulatory Visit: Payer: Self-pay | Admitting: Family Medicine

## 2012-03-07 ENCOUNTER — Telehealth: Payer: Self-pay

## 2012-03-07 ENCOUNTER — Encounter: Payer: Self-pay | Admitting: Family Medicine

## 2012-03-07 NOTE — Telephone Encounter (Signed)
Will call when dr. Has reviewed

## 2012-03-07 NOTE — Telephone Encounter (Signed)
Pt left v/m when additional views and [place] report available call [phone].

## 2012-03-08 ENCOUNTER — Other Ambulatory Visit: Payer: Self-pay | Admitting: Family Medicine

## 2012-03-08 DIAGNOSIS — N6310 Unspecified lump in the right breast, unspecified quadrant: Secondary | ICD-10-CM

## 2012-03-09 ENCOUNTER — Telehealth: Payer: Self-pay

## 2012-03-09 NOTE — Telephone Encounter (Signed)
Pt requested dimensions of masses noted in add'l views. Given to pt over phone. Pt already has appt next week.

## 2012-03-22 ENCOUNTER — Encounter: Payer: Self-pay | Admitting: Family Medicine

## 2012-03-24 ENCOUNTER — Encounter: Payer: Self-pay | Admitting: *Deleted

## 2012-04-03 ENCOUNTER — Telehealth: Payer: Self-pay

## 2012-04-03 NOTE — Telephone Encounter (Signed)
Pt left v/m pt already cancelled 1 month f/u appt; pt said Venlafaxine is doing great; no problems at all. Pt said was told if able to take venlafaxine Could call in med so she can take one Venlafaxine instead of two.Marland Kitchen Pt request call back with Dr Daphine Deutscher decision.[facility] [address]Please advise.

## 2012-04-04 ENCOUNTER — Other Ambulatory Visit: Payer: Self-pay | Admitting: Family Medicine

## 2012-04-04 ENCOUNTER — Ambulatory Visit: Payer: 59 | Admitting: Family Medicine

## 2012-04-04 MED ORDER — VENLAFAXINE HCL ER 75 MG PO TB24: ORAL_TABLET | ORAL | Status: DC

## 2012-04-04 NOTE — Telephone Encounter (Signed)
Okay to cancel 1 month follow up. Sent in med refill at high dose tab.

## 2012-04-10 NOTE — Telephone Encounter (Signed)
PATIENT ADVISED

## 2012-05-04 ENCOUNTER — Encounter: Payer: Self-pay | Admitting: Family Medicine

## 2012-05-04 ENCOUNTER — Ambulatory Visit (INDEPENDENT_AMBULATORY_CARE_PROVIDER_SITE_OTHER): Payer: 59 | Admitting: Family Medicine

## 2012-05-04 ENCOUNTER — Encounter: Payer: Self-pay | Admitting: *Deleted

## 2012-05-04 VITALS — BP 112/80 | HR 100 | Temp 98.9°F | Wt 171.2 lb

## 2012-05-04 DIAGNOSIS — J111 Influenza due to unidentified influenza virus with other respiratory manifestations: Secondary | ICD-10-CM

## 2012-05-04 MED ORDER — OSELTAMIVIR PHOSPHATE 75 MG PO CAPS: 75.0000 mg | ORAL_CAPSULE | Freq: Two times a day (BID) | ORAL | Status: DC

## 2012-05-04 NOTE — Assessment & Plan Note (Signed)
sxs not consistent with viral gastroenteritis despite sick contacts. Anticipate viral urti or flu-like illness. Discussed supportive care. Discussed tamiflu including benefits and risks, discussed given overall health should be fine without. Pt requests written script and will decide whether to fill, discussed if does decide needs to fill promptly (<72 hours from sxs onset) to achieve effect. Out of work today and tomorrow.

## 2012-05-04 NOTE — Progress Notes (Signed)
  Subjective:    Patient ID: Emer Onnen, female    DOB: 05/19/1970, 42 y.o.   MRN: [id number]  HPI CC: ?flu  1 night h/o fever/chills, HA, nausea, body aches.  Did not check temp.  Progressive onset.    So far tried tylenol.  No cough, ear or tooth pain, PNdrainage, ST, diarrhea/constipation, vomiting, new rashes.  Both daughters recently with stomach virus recently. Smoking - 1/2 ppd.  Working on quitting. No h/o asthma.  Did receive flu shot this year.  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  Past Medical History  Diagnosis Date  . Allergy   . Hypertension      Review of Systems Per HPI    Objective:   Physical Exam  Nursing note and vitals reviewed. Constitutional: She appears well-developed and well-nourished. No distress.  HENT:  Head: Normocephalic and atraumatic.  Right Ear: Hearing, tympanic membrane, external ear and ear canal normal.  Left Ear: Hearing, tympanic membrane, external ear and ear canal normal.  Nose: No mucosal edema or rhinorrhea. Right sinus exhibits maxillary sinus tenderness. Right sinus exhibits no frontal sinus tenderness. Left sinus exhibits maxillary sinus tenderness. Left sinus exhibits no frontal sinus tenderness.  Mouth/Throat: Uvula is midline and mucous membranes are normal. Posterior oropharyngeal erythema (mild) present. No oropharyngeal exudate, posterior oropharyngeal edema or tonsillar abscesses.  Eyes: Conjunctivae normal and EOM are normal. Pupils are equal, round, and reactive to light. No scleral icterus.  Neck: Normal range of motion. Neck supple.  Cardiovascular: Normal rate, regular rhythm, normal heart sounds and intact distal pulses.   No murmur heard. Pulmonary/Chest: Effort normal and breath sounds normal. No respiratory distress. She has no wheezes. She has no rales.  Lymphadenopathy:    She has no cervical adenopathy.  Skin: Skin is warm and dry. No rash noted.      Assessment & Plan:

## 2012-05-04 NOTE — Patient Instructions (Signed)
Sounds like you have a viral infection - could be flu like illness. tamiflu script printed out. Push fluids and plenty of rest. May use tylenol/ibuprofen for symptom relief. Please return if you are not improving as expected, or if you have high fevers (>101.5) or difficulty swallowing or worsening productive cough. Call clinic with questions.  Good to see you today.

## 2012-07-10 ENCOUNTER — Other Ambulatory Visit: Payer: Self-pay | Admitting: Family Medicine

## 2012-07-14 ENCOUNTER — Encounter: Payer: Self-pay | Admitting: Family Medicine

## 2012-07-14 NOTE — Telephone Encounter (Signed)
The blood pressure medication includes a diuretic (a fluid pill), the HCTZ.  so you are already on a fluid pill.  Are you noting swelling in your ankles in addition to weight gain? Any chest pain or shortness of breath?  Let me know.  If no to those questions... I would probably recommend working on increasing exercise to decrease fluid accumulation. If not improving in 1-2 months... Make appt to be seen for further testing and evaluation.

## 2012-07-26 ENCOUNTER — Encounter: Payer: Self-pay | Admitting: General Surgery

## 2012-09-05 ENCOUNTER — Ambulatory Visit: Payer: Self-pay | Admitting: General Surgery

## 2012-09-06 ENCOUNTER — Encounter: Payer: Self-pay | Admitting: General Surgery

## 2012-09-13 ENCOUNTER — Encounter: Payer: Self-pay | Admitting: Family Medicine

## 2012-09-14 MED ORDER — ALPRAZOLAM 0.25 MG PO TABS: 0.2500 mg | ORAL_TABLET | Freq: Two times a day (BID) | ORAL | Status: DC | PRN

## 2012-09-14 NOTE — Telephone Encounter (Signed)
Okay to refill  #90 0RF. Last seen in 02/2012  For CPX. Let pt know refills sent in.

## 2012-09-14 NOTE — Telephone Encounter (Signed)
rx called to pharmacy 

## 2012-09-20 ENCOUNTER — Ambulatory Visit: Payer: Self-pay | Admitting: General Surgery

## 2012-09-26 ENCOUNTER — Encounter: Payer: Self-pay | Admitting: General Surgery

## 2012-09-26 ENCOUNTER — Ambulatory Visit (INDEPENDENT_AMBULATORY_CARE_PROVIDER_SITE_OTHER): Payer: 59 | Admitting: General Surgery

## 2012-09-26 VITALS — BP 118/82 | HR 88 | Resp 12 | Ht 66.0 in | Wt 184.0 lb

## 2012-09-26 DIAGNOSIS — Z9889 Other specified postprocedural states: Secondary | ICD-10-CM | POA: Insufficient documentation

## 2012-09-26 DIAGNOSIS — Z1239 Encounter for other screening for malignant neoplasm of breast: Secondary | ICD-10-CM | POA: Insufficient documentation

## 2012-09-26 NOTE — Progress Notes (Signed)
Patient ID: Alyssa Warren, female   DOB: 04/28/1970, 42 y.o.   MRN: [id number]  Chief Complaint  Patient presents with  . Follow-up    mammogram    HPI Alyssa Warren is a 42 y.o. female who presents with a follow up right mammogram done 09/05/12. 6 months ago had core biopsy of 2 masses of right breast - benign fibrosis.  Patient admits to self breast checks and gets regular mammograms done. She has a positive family history of breast problems. No complaints at this time with her breasts.  HPI  Past Medical History  Diagnosis Date  . Allergy   . Hypertension     History reviewed. No pertinent past surgical history.  History reviewed. No pertinent family history.  Social History History  Substance Use Topics  . Smoking status: Current Every Day Smoker -- 0.30 packs/day    Types: Cigarettes  . Smokeless tobacco: Not on file  . Alcohol Use: No    Allergies  Allergen Reactions  . Methyldopa     REACTION: unspecified  . Penicillins     REACTION: Hives, itchy    Current Outpatient Prescriptions  Medication Sig Dispense Refill  . ALPRAZolam (XANAX) 0.25 MG tablet Take 1 tablet (0.25 mg total) by mouth 2 (two) times daily as needed for sleep.  90 tablet  0  . losartan-hydrochlorothiazide (HYZAAR) 50-12.5 MG per tablet TAKE 1 TABLET BY MOUTH DAILY. NEEDS OFFICE VISIT  90 tablet  0  . Multiple Vitamin (MULTIVITAMIN) tablet Take 1 tablet by mouth daily.        . Venlafaxine HCl 75 MG TB24 1 tab po daily  30 each  5   No current facility-administered medications for this visit.    Review of Systems Review of Systems  Constitutional: Negative.   Respiratory: Negative.   Cardiovascular: Negative.     Blood pressure 118/82, pulse 88, resp. rate 12, height 5\' 6"  (1.676 m), weight 184 lb (83.462 kg), last menstrual period 09/23/2012.  Physical Exam Physical Exam  Constitutional: She appears well-developed and well-nourished.  Neck: Trachea normal. No mass and no thyromegaly  present.  Pulmonary/Chest: Right breast exhibits no inverted nipple, no mass, no nipple discharge, no skin change and no tenderness. Left breast exhibits no inverted nipple, no mass, no nipple discharge, no skin change and no tenderness. Breasts are symmetrical.  Lymphadenopathy:    She has no cervical adenopathy.    She has no axillary adenopathy.    Data Reviewed Mammogram reviewed. Clips in place with no masses seen.   Assessment    Benign right breast masses have resolved.    Plan    6 month routine bilateral screening mammogram.        SANKAR,SEEPLAPUTHUR G 09/28/2012, 8:14 AM

## 2012-09-26 NOTE — Patient Instructions (Addendum)
Patient to return in 5 months with bilateral screening mammogram.

## 2012-09-28 ENCOUNTER — Encounter: Payer: Self-pay | Admitting: General Surgery

## 2012-10-11 ENCOUNTER — Other Ambulatory Visit: Payer: Self-pay | Admitting: Family Medicine

## 2012-10-11 NOTE — Telephone Encounter (Signed)
Received refill request electronically from pharmacy. Last office visit 05/04/12 acute visit. Last refill it was noted that patient needed office visit. Is it okay to refill?

## 2012-10-12 NOTE — Telephone Encounter (Signed)
Okay to refill . Last CPX in 02/2012

## 2012-11-06 ENCOUNTER — Other Ambulatory Visit: Payer: Self-pay | Admitting: Family Medicine

## 2013-01-22 ENCOUNTER — Emergency Department: Payer: Self-pay | Admitting: Emergency Medicine

## 2013-01-22 LAB — CK TOTAL AND CKMB (NOT AT ARMC)
CK, Total: 113 U/L (ref 21–215)
CK-MB: 0.9 ng/mL (ref 0.5–3.6)

## 2013-01-22 LAB — COMPREHENSIVE METABOLIC PANEL
BUN: 13 mg/dL (ref 7–18)
Bilirubin,Total: 0.5 mg/dL (ref 0.2–1.0)
Creatinine: 1.01 mg/dL (ref 0.60–1.30)
EGFR (African American): >60
EGFR (Non-African Amer.): >60
Glucose: 208 mg/dL — ABNORMAL HIGH (ref 65–99)
Osmolality: 278 (ref 275–301)
SGOT(AST): 21 U/L (ref 15–37)
Sodium: 136 mmol/L (ref 136–145)
Total Protein: 7.3 g/dL (ref 6.4–8.2)

## 2013-01-22 LAB — CBC
HCT: 40.4 % (ref 35.0–47.0)
MCH: 31.7 pg (ref 26.0–34.0)
MCHC: 34.9 g/dL (ref 32.0–36.0)
MCV: 91 fL (ref 80–100)
Platelet: 276 10*3/uL (ref 150–440)
RBC: 4.45 10*6/uL (ref 3.80–5.20)
RDW: 13.4 % (ref 11.5–14.5)
WBC: 7.8 10*3/uL (ref 3.6–11.0)

## 2013-01-22 LAB — TROPONIN I: Troponin-I: <0.02 ng/mL

## 2013-03-01 ENCOUNTER — Other Ambulatory Visit: Payer: Self-pay

## 2013-03-27 ENCOUNTER — Ambulatory Visit: Payer: 59 | Admitting: General Surgery

## 2013-04-06 ENCOUNTER — Other Ambulatory Visit: Payer: Self-pay | Admitting: Family Medicine

## 2013-05-02 ENCOUNTER — Encounter: Payer: Self-pay | Admitting: *Deleted

## 2013-05-06 ENCOUNTER — Other Ambulatory Visit: Payer: Self-pay | Admitting: Family Medicine

## 2013-05-07 ENCOUNTER — Other Ambulatory Visit: Payer: Self-pay | Admitting: Family Medicine

## 2013-05-08 ENCOUNTER — Other Ambulatory Visit: Payer: Self-pay | Admitting: Family Medicine

## 2013-05-08 LAB — HM DIABETES EYE EXAM

## 2013-06-05 ENCOUNTER — Other Ambulatory Visit: Payer: Self-pay | Admitting: Family Medicine

## 2013-06-05 NOTE — Telephone Encounter (Signed)
I refilled, but for further refills she needs to set up CPX or med refill appt if has GYN that does CPX.

## 2013-06-05 NOTE — Telephone Encounter (Signed)
Last office visit 05/04/2012 with Dr. Danise Mina.  Ok to refill?

## 2013-06-06 ENCOUNTER — Other Ambulatory Visit: Payer: Self-pay | Admitting: Family Medicine

## 2013-06-13 ENCOUNTER — Other Ambulatory Visit: Payer: Self-pay | Admitting: Family Medicine

## 2013-06-18 ENCOUNTER — Emergency Department: Payer: Self-pay | Admitting: Emergency Medicine

## 2013-06-18 LAB — CBC WITH DIFFERENTIAL/PLATELET
Basophil #: 0.1 10*3/uL (ref 0.0–0.1)
Basophil %: 0.8 %
EOS PCT: 1.5 %
Eosinophil #: 0.1 10*3/uL (ref 0.0–0.7)
HCT: 41.7 % (ref 35.0–47.0)
HGB: 14.4 g/dL (ref 12.0–16.0)
LYMPHS ABS: 2.4 10*3/uL (ref 1.0–3.6)
LYMPHS PCT: 30.7 %
MCH: 31.4 pg (ref 26.0–34.0)
MCHC: 34.5 g/dL (ref 32.0–36.0)
MCV: 91 fL (ref 80–100)
MONO ABS: 0.6 x10 3/mm (ref 0.2–0.9)
MONOS PCT: 7.4 %
NEUTROS PCT: 59.6 %
Neutrophil #: 4.7 10*3/uL (ref 1.4–6.5)
Platelet: 219 10*3/uL (ref 150–440)
RBC: 4.58 10*6/uL (ref 3.80–5.20)
RDW: 13.8 % (ref 11.5–14.5)
WBC: 8 10*3/uL (ref 3.6–11.0)

## 2013-06-18 LAB — URINALYSIS, COMPLETE
BLOOD: NEGATIVE
Bilirubin,UR: NEGATIVE
Glucose,UR: 150 mg/dL (ref 0–75)
LEUKOCYTE ESTERASE: NEGATIVE
Nitrite: NEGATIVE
Ph: 5 (ref 4.5–8.0)
Protein: 30
RBC,UR: 1 /HPF (ref 0–5)
Specific Gravity: 1.021 (ref 1.003–1.030)
WBC UR: 1 /HPF (ref 0–5)

## 2013-06-18 LAB — TROPONIN I: Troponin-I: <0.02 ng/mL

## 2013-06-18 LAB — LIPASE, BLOOD: LIPASE: 221 U/L (ref 73–393)

## 2013-06-19 ENCOUNTER — Emergency Department: Payer: Self-pay | Admitting: Emergency Medicine

## 2013-06-19 LAB — COMPREHENSIVE METABOLIC PANEL
ALK PHOS: 77 U/L
Albumin: 3.9 g/dL (ref 3.4–5.0)
Anion Gap: 9 (ref 7–16)
BUN: 12 mg/dL (ref 7–18)
Bilirubin,Total: 0.6 mg/dL (ref 0.2–1.0)
CO2: 25 mmol/L (ref 21–32)
Calcium, Total: 8.6 mg/dL (ref 8.5–10.1)
Chloride: 100 mmol/L (ref 98–107)
Creatinine: 0.71 mg/dL (ref 0.60–1.30)
EGFR (African American): >60
GLUCOSE: 222 mg/dL — AB (ref 65–99)
Osmolality: 275 (ref 275–301)
POTASSIUM: 3.6 mmol/L (ref 3.5–5.1)
SGOT(AST): 44 U/L — ABNORMAL HIGH (ref 15–37)
SGPT (ALT): 49 U/L (ref 12–78)
Sodium: 134 mmol/L — ABNORMAL LOW (ref 136–145)
Total Protein: 7.6 g/dL (ref 6.4–8.2)

## 2013-06-20 ENCOUNTER — Encounter: Payer: Self-pay | Admitting: Internal Medicine

## 2013-06-20 ENCOUNTER — Ambulatory Visit (INDEPENDENT_AMBULATORY_CARE_PROVIDER_SITE_OTHER): Payer: 59 | Admitting: Internal Medicine

## 2013-06-20 VITALS — BP 120/82 | HR 103 | Temp 98.4°F | Wt 186.0 lb

## 2013-06-20 DIAGNOSIS — R1012 Left upper quadrant pain: Secondary | ICD-10-CM

## 2013-06-20 DIAGNOSIS — R110 Nausea: Secondary | ICD-10-CM

## 2013-06-20 DIAGNOSIS — R1032 Left lower quadrant pain: Secondary | ICD-10-CM

## 2013-06-20 NOTE — Patient Instructions (Addendum)
Abdominal Pain, Adult °Many things can cause abdominal pain. Usually, abdominal pain is not caused by a disease and will improve without treatment. It can often be observed and treated at home. Your health care provider will do a physical exam and possibly order blood tests and X-rays to help determine the seriousness of your pain. However, in many cases, more time must pass before a clear cause of the pain can be found. Before that point, your health care provider may not know if you need more testing or further treatment. °HOME CARE INSTRUCTIONS  °Monitor your abdominal pain for any changes. The following actions may help to alleviate any discomfort you are experiencing: °· Only take over-the-counter or prescription medicines as directed by your health care provider. °· Do not take laxatives unless directed to do so by your health care provider. °· Try a clear liquid diet (broth, tea, or water) as directed by your health care provider. Slowly move to a bland diet as tolerated. °SEEK MEDICAL CARE IF: °· You have unexplained abdominal pain. °· You have abdominal pain associated with nausea or diarrhea. °· You have pain when you urinate or have a bowel movement. °· You experience abdominal pain that wakes you in the night. °· You have abdominal pain that is worsened or improved by eating food. °· You have abdominal pain that is worsened with eating fatty foods. °SEEK IMMEDIATE MEDICAL CARE IF:  °· Your pain does not go away within 2 hours. °· You have a fever. °· You keep throwing up (vomiting). °· Your pain is felt only in portions of the abdomen, such as the right side or the left lower portion of the abdomen. °· You pass bloody or black tarry stools. °MAKE SURE YOU: °· Understand these instructions.   °· Will watch your condition.   °· Will get help right away if you are not doing well or get worse.   °Document Released: 01/20/2005 Document Revised: 01/31/2013 Document Reviewed: 12/20/2012 °[employer]® Patient  Information ©2014 [employer]. ° °

## 2013-06-20 NOTE — Progress Notes (Signed)
Pre visit review using our clinic review tool, if applicable. No additional management support is needed unless otherwise documented below in the visit note. 

## 2013-06-20 NOTE — Progress Notes (Signed)
Subjective:    Patient ID: Alyssa Warren, female    DOB: [birth year], 43 y.o.   MRN: [id number]  HPI  Pt presents to the clinic today with c/o abdominal pain. She reports this started 2 days ago. The pain is noted in the LUQ and then moved down to her LLQ. She has has some associated nausea and vomiting. She went to [facility] Monday and Tuesday. She has had a negative abdominal CT scan. Labs were normal. She tested negative for pregnancy. She was given Norco, Bentyl and Zofran which does provide good relief.  Review of Systems      Past Medical History  Diagnosis Date  . Allergy   . Hypertension     Current Outpatient Prescriptions  Medication Sig Dispense Refill  . ALPRAZolam (XANAX) 0.25 MG tablet Take 1 tablet (0.25 mg total) by mouth 2 (two) times daily as needed for sleep.  90 tablet  0  . dicyclomine (BENTYL) 20 MG tablet Take 20 mg by mouth every 6 (six) hours.      Marland Kitchen HYDROcodone-acetaminophen (NORCO/VICODIN) 5-325 MG per tablet Take 1 tablet by mouth every 6 (six) hours as needed for moderate pain.      Marland Kitchen losartan-hydrochlorothiazide (HYZAAR) 50-12.5 MG per tablet TAKE 1 TABLET BY MOUTH DAILY. NEEDS OFFICE VISIT  90 tablet  0  . Multiple Vitamin (MULTIVITAMIN) tablet Take 1 tablet by mouth daily.        . ondansetron (ZOFRAN) 4 MG tablet Take 4 mg by mouth as needed for nausea or vomiting.      . venlafaxine (EFFEXOR) 75 MG tablet TAKE 1 TABLET BY MOUTH DAILY  30 tablet  0   No current facility-administered medications for this visit.    Allergies  Allergen Reactions  . Methyldopa     REACTION: unspecified  . Penicillins     REACTION: Hives, itchy    No family history on file.  History   Social History  . Marital Status: Married    Spouse Name: N/A    Number of Children: N/A  . Years of Education: N/A   Occupational History  . Not on file.   Social History Main Topics  . Smoking status: Current Every Day Smoker -- 0.30 packs/day    Types: Cigarettes  .  Smokeless tobacco: Not on file  . Alcohol Use: No  . Drug Use: No  . Sexual Activity: Not on file   Other Topics Concern  . Not on file   Social History Narrative  . No narrative on file     Constitutional: Denies fever, malaise, fatigue, headache or abrupt weight changes.  Respiratory: Denies difficulty breathing, shortness of breath, cough or sputum production.   Cardiovascular: Denies chest pain, chest tightness, palpitations or swelling in the hands or feet.  Gastrointestinal: Pt reports abdominal pain and nausea. Denies bloating, constipation, diarrhea or blood in the stool.  GU: Denies urgency, frequency, pain with urination, burning sensation, blood in urine, odor or discharge. .   No other specific complaints in a complete review of systems (except as listed in HPI above).  Objective:   Physical Exam  BP 120/82  Pulse 103  Temp(Src) 98.4 F (36.9 C) (Oral)  Wt 186 lb (84.369 kg)  SpO2 98% Wt Readings from Last 3 Encounters:  06/20/13 186 lb (84.369 kg)  09/26/12 184 lb (83.462 kg)  05/04/12 171 lb 4 oz (77.678 kg)    General: Appears her stated age, obese but well developed, well nourished in NAD.  Cardiovascular: Normal rate and rhythm. S1,S2 noted.  No murmur, rubs or gallops noted. No JVD or BLE edema. No carotid bruits noted. Pulmonary/Chest: Normal effort and positive vesicular breath sounds. No respiratory distress. No wheezes, rales or ronchi noted.  Abdomen: Soft and nontender. Normal bowel sounds, no bruits noted. No distention or masses noted. Liver, spleen and kidneys non palpable.   BMET    Component Value Date/Time   NA 141 02/25/2012 1050   K 3.9 02/25/2012 1050   CL 103 02/25/2012 1050   CO2 29 02/25/2012 1050   GLUCOSE 112* 02/25/2012 1050   BUN 14 02/25/2012 1050   CREATININE 0.8 02/25/2012 1050   CALCIUM 9.4 02/25/2012 1050   GFRNONAA 98.53 12/30/2009 1446    Lipid Panel     Component Value Date/Time   CHOL 207* 02/25/2012 1050   TRIG 278.0*  02/25/2012 1050   HDL 35.80* 02/25/2012 1050   CHOLHDL 6 02/25/2012 1050   VLDL 55.6* 02/25/2012 1050    CBC No results found for this basename: wbc, rbc, hgb, hct, plt, mcv, mch, mchc, rdw, neutrabs, lymphsabs, monoabs, eosabs, basosabs    Hgb A1C No results found for this basename: HGBA1C         Assessment & Plan:   Left side abdominal pain:  Unable to identify source Scans and bloodwork were negative No need to repeat If does not improve, or continues to get worse, will refer to GI  RTC as needed or if symptoms persist or worsen

## 2013-06-22 ENCOUNTER — Telehealth: Payer: Self-pay | Admitting: Family Medicine

## 2013-06-22 NOTE — Telephone Encounter (Signed)
Relevant patient education assigned to patient using Emmi. ° °

## 2013-06-26 ENCOUNTER — Telehealth: Payer: Self-pay | Admitting: Family Medicine

## 2013-06-26 DIAGNOSIS — R7303 Prediabetes: Secondary | ICD-10-CM

## 2013-06-26 DIAGNOSIS — E781 Pure hyperglyceridemia: Secondary | ICD-10-CM

## 2013-06-26 DIAGNOSIS — I10 Essential (primary) hypertension: Secondary | ICD-10-CM

## 2013-06-26 NOTE — Telephone Encounter (Signed)
Message copied by Jinny Sanders on Tue Jun 26, 2013 11:44 PM ------      Message from: Ellamae Sia      Created: Fri Jun 22, 2013  3:35 PM      Regarding: Lab orders for Friday, 3.6.15       Patient is scheduled for CPX labs, please order future labs, Thanks , Terri       ------

## 2013-06-29 ENCOUNTER — Other Ambulatory Visit (INDEPENDENT_AMBULATORY_CARE_PROVIDER_SITE_OTHER): Payer: 59

## 2013-06-29 DIAGNOSIS — R7309 Other abnormal glucose: Secondary | ICD-10-CM

## 2013-06-29 DIAGNOSIS — E781 Pure hyperglyceridemia: Secondary | ICD-10-CM

## 2013-06-29 DIAGNOSIS — R7303 Prediabetes: Secondary | ICD-10-CM

## 2013-06-29 LAB — COMPREHENSIVE METABOLIC PANEL
ALK PHOS: 56 U/L (ref 39–117)
ALT: 32 U/L (ref 0–35)
AST: 29 U/L (ref 0–37)
Albumin: 3.8 g/dL (ref 3.5–5.2)
BILIRUBIN TOTAL: 0.8 mg/dL (ref 0.3–1.2)
BUN: 12 mg/dL (ref 6–23)
CO2: 24 mEq/L (ref 19–32)
CREATININE: 0.8 mg/dL (ref 0.4–1.2)
Calcium: 9.4 mg/dL (ref 8.4–10.5)
Chloride: 103 mEq/L (ref 96–112)
GFR: 83.03 mL/min (ref >60.00)
Glucose, Bld: 169 mg/dL — ABNORMAL HIGH (ref 70–99)
Potassium: 3.9 mEq/L (ref 3.5–5.1)
SODIUM: 138 meq/L (ref 135–145)
TOTAL PROTEIN: 7 g/dL (ref 6.0–8.3)

## 2013-06-29 LAB — HEMOGLOBIN A1C: HEMOGLOBIN A1C: 8.9 % — AB (ref 4.6–6.5)

## 2013-06-29 LAB — LIPID PANEL
CHOLESTEROL: 256 mg/dL — AB (ref 0–200)
HDL: 40.3 mg/dL (ref >39.00)
LDL Cholesterol: 26 mg/dL (ref 0–99)
Total CHOL/HDL Ratio: 6
Triglycerides: 949 mg/dL — ABNORMAL HIGH (ref 0.0–149.0)
VLDL: 189.8 mg/dL — AB (ref 0.0–40.0)

## 2013-07-03 ENCOUNTER — Ambulatory Visit: Payer: Self-pay | Admitting: General Surgery

## 2013-07-05 ENCOUNTER — Encounter: Payer: Self-pay | Admitting: General Surgery

## 2013-07-06 ENCOUNTER — Other Ambulatory Visit: Payer: Self-pay | Admitting: Family Medicine

## 2013-07-06 ENCOUNTER — Ambulatory Visit (INDEPENDENT_AMBULATORY_CARE_PROVIDER_SITE_OTHER): Payer: 59 | Admitting: Family Medicine

## 2013-07-06 ENCOUNTER — Other Ambulatory Visit (HOSPITAL_COMMUNITY)
Admission: RE | Admit: 2013-07-06 | Discharge: 2013-07-06 | Disposition: A | Discharge status: Home / Self Care | Payer: 59 | Source: Ambulatory Visit | Attending: Family Medicine | Admitting: Family Medicine

## 2013-07-06 ENCOUNTER — Encounter: Payer: Self-pay | Admitting: Family Medicine

## 2013-07-06 VITALS — BP 128/92 | HR 106 | Temp 98.0°F | Ht 66.0 in | Wt 186.5 lb

## 2013-07-06 DIAGNOSIS — E1165 Type 2 diabetes mellitus with hyperglycemia: Secondary | ICD-10-CM

## 2013-07-06 DIAGNOSIS — Z01419 Encounter for gynecological examination (general) (routine) without abnormal findings: Secondary | ICD-10-CM | POA: Insufficient documentation

## 2013-07-06 DIAGNOSIS — I10 Essential (primary) hypertension: Secondary | ICD-10-CM

## 2013-07-06 DIAGNOSIS — R7303 Prediabetes: Secondary | ICD-10-CM

## 2013-07-06 DIAGNOSIS — R7309 Other abnormal glucose: Secondary | ICD-10-CM

## 2013-07-06 DIAGNOSIS — Z0000 Encounter for general adult medical examination without abnormal findings: Secondary | ICD-10-CM

## 2013-07-06 DIAGNOSIS — E781 Pure hyperglyceridemia: Secondary | ICD-10-CM

## 2013-07-06 DIAGNOSIS — Z124 Encounter for screening for malignant neoplasm of cervix: Secondary | ICD-10-CM

## 2013-07-06 DIAGNOSIS — E119 Type 2 diabetes mellitus without complications: Secondary | ICD-10-CM

## 2013-07-06 LAB — HM DIABETES FOOT EXAM

## 2013-07-06 MED ORDER — METFORMIN HCL ER (MOD) 500 MG PO TB24: 500.0000 mg | ORAL_TABLET | Freq: Every day | ORAL | Status: DC

## 2013-07-06 MED ORDER — FENOFIBRATE 160 MG PO TABS: 160.0000 mg | ORAL_TABLET | Freq: Every day | ORAL | Status: DC

## 2013-07-06 NOTE — Progress Notes (Signed)
The patient is here for annual wellness exam and preventative care.   She was seen in 05/2013 for left abdominal pain, fever, bodyache Nausea.  Seen in ER:Dx with viral GE. Symptoms have improved some.  Depression, anxiety, PTSD: followed by counselor.  on venlafaxine daily and uses alprazolam prn.  Hypertension: At goal on losartan HCTZ  Using medication without problems or lightheadedness:None  Chest pain with exertion:None  Edema:None  Short of breath:none  BP Readings from Last 3 Encounters:  07/06/13 128/92  06/20/13 120/82  09/26/12 118/82   Average home BPs: 130/80s Other issues:   Elevated Cholesterol: LDL at goal <100, but trigs dangerously high.  Lab Results  Component Value Date   CHOL 256* 06/29/2013   HDL 40.30 06/29/2013   LDLCALC 26 06/29/2013   LDLDIRECT 130.0 02/25/2012   TRIG 949.0* 06/29/2013   CHOLHDL 6 06/29/2013  Diet compliance: Moderate  Exercise:Minimal  Other complaints:   Diabetes: She has progressed in the last year from prediabetes to diabetes. Lab Results  Component Value Date   HGBA1C 8.9* 06/29/2013  Drinking a lot of soda. Using medications without difficulties:No on any Feet problems: no ulcers Blood Sugars averaging:not checking. eye exam within last year: yes  Wt Readings from Last 3 Encounters:  07/06/13 186 lb 8 oz (84.596 kg)  06/20/13 186 lb (84.369 kg)  09/26/12 184 lb (83.462 kg)  She tolerated metformin with gestational DM.  Review of Systems  Constitutional: Negative for fever, fatigue and unexpected weight change.  HENT: Negative for ear pain, congestion, sore throat, sneezing, trouble swallowing and sinus pressure.  Eyes: Negative for pain and itching.  Respiratory: Negative for cough, chest tightness, shortness of breath and wheezing.  Cardiovascular: Negative for chest pain, palpitations and leg swelling.  Gastrointestinal: Negative for nausea, abdominal pain, diarrhea, constipation and blood in stool.  Genitourinary: Negative  for dysuria, hematuria, vaginal discharge, difficulty urinating and  She has noted menometrorrhagia, heavy bleeding with menses, clots Skin: Negative for rash.  Neurological: Negative for syncope, weakness, light-headedness, numbness and headaches.    Objective:   Physical Exam  Constitutional: Vital signs are normal. She appears well-developed and well-nourished. She is cooperative. Non-toxic appearance. She does not appear ill. No distress.  HENT:  Head: Normocephalic.  Right Ear: Hearing, tympanic membrane, external ear and ear canal normal. Tympanic membrane is not erythematous, not retracted and not bulging.  Left Ear: Hearing, tympanic membrane, external ear and ear canal normal. Tympanic membrane is not erythematous, not retracted and not bulging.  Nose: Nose normal. No mucosal edema or rhinorrhea. Right sinus exhibits no maxillary sinus tenderness and no frontal sinus tenderness. Left sinus exhibits no maxillary sinus tenderness and no frontal sinus tenderness.  Mouth/Throat: Uvula is midline, oropharynx is clear and moist and mucous membranes are normal.  Eyes: Conjunctivae normal, EOM and lids are normal. Pupils are equal, round, and reactive to light. No foreign bodies found.  Neck: Trachea normal and normal range of motion. Neck supple. Carotid bruit is not present. No mass and no thyromegaly present.  Cardiovascular: Normal rate, regular rhythm, S1 normal, S2 normal, normal heart sounds, intact distal pulses and normal pulses. Exam reveals no gallop and no friction rub.  No murmur heard.  Pulmonary/Chest: Effort normal and breath sounds normal. Not tachypneic. No respiratory distress. She has no decreased breath sounds. She has no wheezes. She has no rhonchi. She has no rales.  Abdominal: Soft. Normal appearance and bowel sounds are normal. She exhibits no distension, no fluid wave,  no abdominal bruit and no mass. There is no hepatosplenomegaly. There is no tenderness. There is no  rebound, no guarding and no CVA tenderness. No hernia.  Genitourinary: Vagina normal and uterus normal. No breast swelling, tenderness, discharge or bleeding. Pelvic exam was performed with patient prone. There is no rash, tenderness or lesion on the right labia. There is no rash, tenderness or lesion on the left labia. Uterus is not enlarged and not tender. Right adnexum displays no mass, no tenderness and no fullness. Left adnexum displays no mass, no tenderness and no fullness.  Lymphadenopathy:  She has no cervical adenopathy.  She has no axillary adenopathy.  Neurological: She is alert. She has normal strength. No cranial nerve deficit or sensory deficit.  Skin: Skin is warm, dry and intact. No rash noted.  Psychiatric: Her speech is normal and behavior is normal. Judgment and thought content normal. Her mood appears not anxious. Cognition and memory are normal. She does not exhibit a depressed mood.  Assessment & Plan:   The patient's preventative maintenance and recommended screening tests for an annual wellness exam were reviewed in full today.  Brought up to date unless services declined.  Counselled on the importance of diet, exercise, and its role in overall health and mortality.  The patient's FH and SH was reviewed, including their home life, tobacco status, and drug and alcohol status.   Vaccine: uptodate with Tdap, candidate for PNA Mammo:06/2013.Marland Kitchen Concerning area on left.. Needs spot compression. PAP/DVE: nml pap 2012, has had 3 normals in a row, on q2 year schedule.  Smoker

## 2013-07-06 NOTE — Patient Instructions (Addendum)
[facility] should call you to set up spot compression test for left breast.  Start exercise regularly. Stop soda. Increase water. Work on low [employer]. Stop at front desk to set up referral to nutrition.  Start on metformin  and fenofibrate daily.  Follow fasting blood sugars and 2 hours after meals, record measurements.  Follow up 2-4 weeks for DM follow up.  Look into pneumonia vacccine coverage.

## 2013-07-06 NOTE — Assessment & Plan Note (Signed)
Very poor control. Start fenofibrate.  Encouraged exercise, weight loss, healthy eating habits.

## 2013-07-06 NOTE — Assessment & Plan Note (Signed)
Well controlled. Continue current medication.  

## 2013-07-06 NOTE — Assessment & Plan Note (Signed)
Info reviewed. Encouraged exercise, weight loss, healthy eating habits.  Start on metformin. Refer to nutritionist. Follow up in 2 weeks.

## 2013-07-06 NOTE — Progress Notes (Signed)
Pre visit review using our clinic review tool, if applicable. No additional management support is needed unless otherwise documented below in the visit note. 

## 2013-07-09 ENCOUNTER — Telehealth: Payer: Self-pay | Admitting: Family Medicine

## 2013-07-09 NOTE — Telephone Encounter (Signed)
Relevant patient education assigned to patient using Emmi. ° °

## 2013-07-10 ENCOUNTER — Encounter: Payer: Self-pay | Admitting: *Deleted

## 2013-07-12 ENCOUNTER — Encounter: Payer: Self-pay | Admitting: Family Medicine

## 2013-07-18 ENCOUNTER — Encounter: Payer: Self-pay | Admitting: General Surgery

## 2013-07-18 ENCOUNTER — Ambulatory Visit: Payer: Self-pay | Admitting: General Surgery

## 2013-07-24 ENCOUNTER — Ambulatory Visit: Payer: Self-pay | Admitting: Family Medicine

## 2013-07-25 ENCOUNTER — Ambulatory Visit: Payer: Self-pay | Admitting: Family Medicine

## 2013-07-25 ENCOUNTER — Ambulatory Visit (INDEPENDENT_AMBULATORY_CARE_PROVIDER_SITE_OTHER): Payer: 59 | Admitting: General Surgery

## 2013-07-25 ENCOUNTER — Encounter: Payer: Self-pay | Admitting: General Surgery

## 2013-07-25 VITALS — BP 144/88 | HR 88 | Resp 12 | Ht 66.0 in

## 2013-07-25 DIAGNOSIS — Z803 Family history of malignant neoplasm of breast: Secondary | ICD-10-CM

## 2013-07-25 DIAGNOSIS — Z9889 Other specified postprocedural states: Secondary | ICD-10-CM

## 2013-07-25 DIAGNOSIS — Z1239 Encounter for other screening for malignant neoplasm of breast: Secondary | ICD-10-CM

## 2013-07-25 NOTE — Patient Instructions (Signed)
Patient to return in one year screening bilateral mammogram. Continue self breast exams. Call office for any new breast issues or concerns.

## 2013-07-25 NOTE — Progress Notes (Signed)
Patient ID: Alyssa Warren, female   DOB: 14-Sep-1970, 43 y.o.   MRN: [id number]  Chief Complaint  Patient presents with  . Follow-up    mammogram    HPI Alyssa Warren is a 43 y.o. female who presents for a breast evaluation. The most recent mammogram was done on 07/18/13. Patient does perform regular self breast checks and gets regular mammograms done. Patient  recently noted to be diabetic and is on Metformin.  HPI  Past Medical History  Diagnosis Date  . Allergy   . Hypertension   . Diabetes mellitus without complication     History reviewed. No pertinent past surgical history.  Family History  Problem Relation Age of Onset  . Breast cancer Maternal Aunt     great Aunts   . Breast cancer Paternal Aunt     Social History History  Substance Use Topics  . Smoking status: Current Every Day Smoker -- 0.30 packs/day    Types: Cigarettes  . Smokeless tobacco: Never Used  . Alcohol Use: No    Allergies  Allergen Reactions  . Methyldopa     REACTION: unspecified  . Penicillins     REACTION: Hives, itchy    Current Outpatient Prescriptions  Medication Sig Dispense Refill  . ALPRAZolam (XANAX) 0.25 MG tablet Take 1 tablet (0.25 mg total) by mouth 2 (two) times daily as needed for sleep.  90 tablet  0  . fenofibrate 160 MG tablet Take 1 tablet (160 mg total) by mouth daily.  30 tablet  11  . losartan-hydrochlorothiazide (HYZAAR) 50-12.5 MG per tablet TAKE 1 TABLET BY MOUTH DAILY. NEEDS OFFICE VISIT  90 tablet  1  . metFORMIN (GLUMETZA) 500 MG (MOD) 24 hr tablet Take 500 mg by mouth 2 (two) times daily with a meal.      . Multiple Vitamin (MULTIVITAMIN) tablet Take 1 tablet by mouth daily.        Marland Kitchen venlafaxine (EFFEXOR) 75 MG tablet TAKE 1 TABLET BY MOUTH DAILY  30 tablet  11   No current facility-administered medications for this visit.    Review of Systems Review of Systems  Constitutional: Negative.   Respiratory: Negative.   Cardiovascular: Negative.     Blood pressure  144/88, pulse 88, resp. rate 12, height 5\' 6"  (1.676 m), last menstrual period 06/11/2013.  Physical Exam Physical Exam  Constitutional: She is oriented to person, place, and time. She appears well-developed and well-nourished.  Eyes: Conjunctivae are normal. No scleral icterus.  Neck: No mass and no thyromegaly present.  Cardiovascular: Normal rate, regular rhythm and normal heart sounds.   Pulmonary/Chest: Breath sounds normal. Right breast exhibits no inverted nipple, no mass, no nipple discharge, no skin change and no tenderness. Left breast exhibits no inverted nipple, no mass, no nipple discharge, no skin change and no tenderness.  Lymphadenopathy:    She has no cervical adenopathy.    She has no axillary adenopathy.  Neurological: She is alert and oriented to person, place, and time.  Skin: Skin is warm and dry.    Data Reviewed Mammogram reviewed  Assessment    Stable exam     Plan    Patient to return in one year with screening bilateral mammogram.        Tyannah Sane G 07/27/2013, 9:53 AM

## 2013-07-27 ENCOUNTER — Encounter: Payer: Self-pay | Admitting: General Surgery

## 2013-07-27 DIAGNOSIS — Z803 Family history of malignant neoplasm of breast: Secondary | ICD-10-CM | POA: Insufficient documentation

## 2013-07-31 ENCOUNTER — Encounter: Payer: Self-pay | Admitting: Family Medicine

## 2013-07-31 ENCOUNTER — Telehealth: Payer: Self-pay | Admitting: Family Medicine

## 2013-07-31 ENCOUNTER — Ambulatory Visit (INDEPENDENT_AMBULATORY_CARE_PROVIDER_SITE_OTHER): Payer: 59 | Admitting: Family Medicine

## 2013-07-31 VITALS — BP 138/82 | HR 93 | Temp 98.4°F | Wt 184.2 lb

## 2013-07-31 DIAGNOSIS — E119 Type 2 diabetes mellitus without complications: Secondary | ICD-10-CM

## 2013-07-31 DIAGNOSIS — E1165 Type 2 diabetes mellitus with hyperglycemia: Secondary | ICD-10-CM

## 2013-07-31 MED ORDER — GLIPIZIDE ER 5 MG PO TB24: 5.0000 mg | ORAL_TABLET | Freq: Every day | ORAL | Status: DC

## 2013-07-31 NOTE — Assessment & Plan Note (Signed)
Working on lifestyle changes, she has cut out soda. Minimal improvement in FBS on metformin. Add glucotrol 5 mg daily.  Follow up in 2 months, but call with measurements sooner.

## 2013-07-31 NOTE — Progress Notes (Signed)
Pre visit review using our clinic review tool, if applicable. No additional management support is needed unless otherwise documented below in the visit note. 

## 2013-07-31 NOTE — Progress Notes (Signed)
   Subjective:    Patient ID: Alyssa Warren, female    DOB: [birth year], 43 y.o.   MRN: [id number]  HPI 43 year old female presents for 1 month DM follow up.  At last OV she was started on metformin. This med was hurting her stomach so she has spaced it to twice day to avoid SE.  Today she reports her blood sugars  remain elevated in AM FBS 142-190, 2 hours post prandial 161-177. No low CBGs less than 60.   She is much less tired, feeling better.    Wt Readings from Last 3 Encounters:  07/31/13 184 lb 4 oz (83.575 kg)  07/06/13 186 lb 8 oz (84.596 kg)  06/20/13 186 lb (84.369 kg)         Review of Systems  Constitutional: Negative for fever and fatigue.  HENT: Negative for ear pain.   Eyes: Negative for pain.  Respiratory: Negative for chest tightness and shortness of breath.   Cardiovascular: Negative for chest pain, palpitations and leg swelling.  Gastrointestinal: Negative for abdominal pain.  Genitourinary: Negative for dysuria.       Objective:   Physical Exam  Constitutional: Vital signs are normal. She appears well-developed and well-nourished. She is cooperative.  Non-toxic appearance. She does not appear ill. No distress.  HENT:  Head: Normocephalic.  Right Ear: Hearing, tympanic membrane, external ear and ear canal normal. Tympanic membrane is not erythematous, not retracted and not bulging.  Left Ear: Hearing, tympanic membrane, external ear and ear canal normal. Tympanic membrane is not erythematous, not retracted and not bulging.  Nose: No mucosal edema or rhinorrhea. Right sinus exhibits no maxillary sinus tenderness and no frontal sinus tenderness. Left sinus exhibits no maxillary sinus tenderness and no frontal sinus tenderness.  Mouth/Throat: Uvula is midline, oropharynx is clear and moist and mucous membranes are normal.  Eyes: Conjunctivae, EOM and lids are normal. Pupils are equal, round, and reactive to light. Lids are everted and swept, no foreign bodies  found.  Neck: Trachea normal and normal range of motion. Neck supple. Carotid bruit is not present. No mass and no thyromegaly present.  Cardiovascular: Normal rate, regular rhythm, S1 normal, S2 normal, normal heart sounds, intact distal pulses and normal pulses.  Exam reveals no gallop and no friction rub.   No murmur heard. Pulmonary/Chest: Effort normal and breath sounds normal. Not tachypneic. No respiratory distress. She has no decreased breath sounds. She has no wheezes. She has no rhonchi. She has no rales.  Abdominal: Soft. Normal appearance and bowel sounds are normal. There is no tenderness.  Neurological: She is alert.  Skin: Skin is warm, dry and intact. No rash noted.  Psychiatric: Her speech is normal and behavior is normal. Judgment and thought content normal. Her mood appears not anxious. Cognition and memory are normal. She does not exhibit a depressed mood.          Assessment & Plan:

## 2013-07-31 NOTE — Telephone Encounter (Signed)
Relevant patient education assigned to patient using Emmi. ° °

## 2013-07-31 NOTE — Patient Instructions (Signed)
Continue metformin at current dose. Start glucotrol XL daily in AM. Call with blood sugars are in 1-2 weeks. If not at goal we will either increase glucotrol or consider victoza. Follow up in 2 months with fasting labs prior.

## 2013-08-13 ENCOUNTER — Telehealth: Payer: Self-pay

## 2013-08-13 ENCOUNTER — Other Ambulatory Visit: Payer: Self-pay | Admitting: Family Medicine

## 2013-08-13 MED ORDER — METFORMIN HCL ER (MOD) 500 MG PO TB24: 500.0000 mg | ORAL_TABLET | Freq: Two times a day (BID) | ORAL | Status: DC

## 2013-08-13 NOTE — Telephone Encounter (Signed)
Relevant patient education assigned to patient using Emmi. ° °

## 2013-08-16 MED ORDER — METFORMIN HCL 500 MG PO TABS: 500.0000 mg | ORAL_TABLET | Freq: Two times a day (BID) | ORAL | Status: DC

## 2013-08-16 NOTE — Addendum Note (Signed)
Addended by: Carter Kitten on: 08/16/2013 11:54 AM   Modules accepted: Orders

## 2013-08-27 ENCOUNTER — Encounter: Payer: Self-pay | Admitting: Family Medicine

## 2013-08-27 MED ORDER — LOSARTAN POTASSIUM-HCTZ 50-12.5 MG PO TABS: ORAL_TABLET | ORAL | Status: DC

## 2013-08-28 ENCOUNTER — Ambulatory Visit: Payer: Self-pay | Admitting: Family Medicine

## 2013-08-28 ENCOUNTER — Other Ambulatory Visit: Payer: Self-pay | Admitting: *Deleted

## 2013-08-28 MED ORDER — FENOFIBRATE 160 MG PO TABS: 160.0000 mg | ORAL_TABLET | Freq: Every day | ORAL | Status: DC

## 2013-08-28 MED ORDER — VENLAFAXINE HCL 75 MG PO TABS: ORAL_TABLET | ORAL | Status: DC

## 2013-08-28 MED ORDER — GLIPIZIDE ER 5 MG PO TB24: 5.0000 mg | ORAL_TABLET | Freq: Every day | ORAL | Status: DC

## 2013-09-04 ENCOUNTER — Other Ambulatory Visit: Payer: Self-pay | Admitting: *Deleted

## 2013-09-04 MED ORDER — FENOFIBRATE 160 MG PO TABS: 160.0000 mg | ORAL_TABLET | Freq: Every day | ORAL | Status: DC

## 2013-09-04 MED ORDER — VENLAFAXINE HCL 75 MG PO TABS: ORAL_TABLET | ORAL | Status: DC

## 2013-09-04 MED ORDER — GLIPIZIDE ER 5 MG PO TB24: 5.0000 mg | ORAL_TABLET | Freq: Every day | ORAL | Status: DC

## 2013-09-04 NOTE — Telephone Encounter (Signed)
Faxed to [facility] [phone].

## 2013-09-19 ENCOUNTER — Encounter: Payer: Self-pay | Admitting: Family Medicine

## 2013-09-19 MED ORDER — LOSARTAN POTASSIUM-HCTZ 50-12.5 MG PO TABS: ORAL_TABLET | ORAL | Status: DC

## 2013-09-19 MED ORDER — METFORMIN HCL 500 MG PO TABS: 500.0000 mg | ORAL_TABLET | Freq: Two times a day (BID) | ORAL | Status: DC

## 2013-09-19 MED ORDER — GLUCOSE BLOOD VI STRP: ORAL_STRIP | Status: DC

## 2013-09-24 ENCOUNTER — Ambulatory Visit: Payer: Self-pay | Admitting: Family Medicine

## 2013-10-02 ENCOUNTER — Encounter: Payer: Self-pay | Admitting: Family Medicine

## 2013-10-03 MED ORDER — GLIPIZIDE ER 10 MG PO TB24: 10.0000 mg | ORAL_TABLET | Freq: Every day | ORAL | Status: DC

## 2013-10-04 MED ORDER — GLIPIZIDE ER 10 MG PO TB24
10.0000 mg | ORAL_TABLET | Freq: Every day | ORAL | Status: DC
Start: 2013-10-04 — End: 2015-04-07

## 2013-10-04 NOTE — Addendum Note (Signed)
Addended by: Carter Kitten on: 10/04/2013 10:40 AM   Modules accepted: Orders

## 2013-12-21 ENCOUNTER — Ambulatory Visit: Payer: Self-pay | Admitting: Family Medicine

## 2013-12-23 ENCOUNTER — Emergency Department: Payer: Self-pay | Admitting: Internal Medicine

## 2013-12-23 LAB — CBC WITH DIFFERENTIAL/PLATELET
Basophil #: 0 10*3/uL (ref 0.0–0.1)
Basophil %: 0.5 %
Eosinophil #: 0.2 10*3/uL (ref 0.0–0.7)
Eosinophil %: 2.8 %
HCT: 41.5 % (ref 35.0–47.0)
HGB: 13.7 g/dL (ref 12.0–16.0)
LYMPHS ABS: 2.5 10*3/uL (ref 1.0–3.6)
Lymphocyte %: 37 %
MCH: 30.2 pg (ref 26.0–34.0)
MCHC: 33.1 g/dL (ref 32.0–36.0)
MCV: 91 fL (ref 80–100)
MONO ABS: 0.4 x10 3/mm (ref 0.2–0.9)
MONOS PCT: 6.2 %
NEUTROS PCT: 53.5 %
Neutrophil #: 3.6 10*3/uL (ref 1.4–6.5)
PLATELETS: 286 10*3/uL (ref 150–440)
RBC: 4.54 10*6/uL (ref 3.80–5.20)
RDW: 14 % (ref 11.5–14.5)
WBC: 6.8 10*3/uL (ref 3.6–11.0)

## 2013-12-23 LAB — BASIC METABOLIC PANEL
Anion Gap: 10 (ref 7–16)
BUN: 14 mg/dL (ref 7–18)
CHLORIDE: 106 mmol/L (ref 98–107)
CO2: 23 mmol/L (ref 21–32)
Calcium, Total: 8.9 mg/dL (ref 8.5–10.1)
Creatinine: 1.07 mg/dL (ref 0.60–1.30)
EGFR (African American): >60
EGFR (Non-African Amer.): >60
Glucose: 157 mg/dL — ABNORMAL HIGH (ref 65–99)
OSMOLALITY: 281 (ref 275–301)
POTASSIUM: 3.3 mmol/L — AB (ref 3.5–5.1)
SODIUM: 139 mmol/L (ref 136–145)

## 2013-12-25 ENCOUNTER — Other Ambulatory Visit: Payer: Self-pay | Admitting: Family Medicine

## 2014-01-10 ENCOUNTER — Other Ambulatory Visit: Payer: Self-pay | Admitting: Family Medicine

## 2014-02-08 ENCOUNTER — Other Ambulatory Visit: Payer: Self-pay

## 2014-02-22 ENCOUNTER — Encounter: Payer: Self-pay | Admitting: Internal Medicine

## 2014-02-22 ENCOUNTER — Ambulatory Visit (INDEPENDENT_AMBULATORY_CARE_PROVIDER_SITE_OTHER): Payer: 59 | Admitting: Internal Medicine

## 2014-02-22 VITALS — BP 118/88 | HR 104 | Temp 98.2°F | Wt 176.8 lb

## 2014-02-22 DIAGNOSIS — J069 Acute upper respiratory infection, unspecified: Secondary | ICD-10-CM

## 2014-02-22 MED ORDER — AZITHROMYCIN 250 MG PO TABS: ORAL_TABLET | ORAL | Status: DC

## 2014-02-22 MED ORDER — BENZONATATE 100 MG PO CAPS: 100.0000 mg | ORAL_CAPSULE | Freq: Two times a day (BID) | ORAL | Status: DC | PRN

## 2014-02-22 NOTE — Progress Notes (Signed)
Pre visit review using our clinic review tool, if applicable. No additional management support is needed unless otherwise documented below in the visit note. 

## 2014-02-22 NOTE — Progress Notes (Signed)
HPI  Pt presents to the clinic today with c/o cough, chest congestion and nasal congestion. She reports this started 3 days ago. She did have cold symptoms for 1 week prior to this.The cough is productive of thick brown mucous. She has had fever, chills and body aches. 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 She did use a nebulizer treatment yesterday (it is her grandmothers nebulizer). She has had sick contacts. She also had a history of seasonal allergies. She does smoke.  Review of Systems      Past Medical History  Diagnosis Date  . Allergy   . Hypertension   . Diabetes mellitus without complication     Family History  Problem Relation Age of Onset  . Breast cancer Maternal Aunt     great Aunts   . Breast cancer Paternal Aunt     History   Social History  . Marital Status: Married    Spouse Name: N/A    Number of Children: N/A  . Years of Education: N/A   Occupational History  . Not on file.   Social History Main Topics  . Smoking status: Current Every Day Smoker -- 0.30 packs/day    Types: Cigarettes  . Smokeless tobacco: Never Used  . Alcohol Use: No  . Drug Use: No  . Sexual Activity: Not on file   Other Topics Concern  . Not on file   Social History Narrative  . No narrative on file    Allergies  Allergen Reactions  . Methyldopa     REACTION: unspecified  . Penicillins     REACTION: Hives, itchy     Constitutional: Positive headache fever. Denies fatigue or  abrupt weight changes.  HEENT:  Positive nasal congestion. Denies eye redness, eye pain, pressure behind the eyes, facial pain, ear pain, ringing in the ears, wax buildup, runny nose or bloody nose. Respiratory: Positive cough. Denies difficulty breathing or shortness of breath.  Cardiovascular: Denies chest pain, chest tightness, palpitations or swelling in the hands or feet.   No other specific complaints in a complete review of systems (except as listed in HPI  above).  Objective:   BP 118/88  Pulse 104  Temp(Src) 98.2 F (36.8 C) (Oral)  Wt 176 lb 12 oz (80.173 kg)  SpO2 97% Wt Readings from Last 3 Encounters:  02/22/14 176 lb 12 oz (80.173 kg)  07/31/13 184 lb 4 oz (83.575 kg)  07/06/13 186 lb 8 oz (84.596 kg)     General: Appears her stated age, well developed, well nourished in NAD. HEENT: Head: normal shape and size; Ears: Tm's gray and intact, normal light reflex, + effusion on the left; Nose: mucosa pink and moist, septum midline; Throat/Mouth:  Teeth present, mucosa pink and moist, no exudate noted, no lesions or ulcerations noted.  Neck: Mild cervical lymphadenopathy. Neck supple, trachea midline.  Cardiovascular: Tachycardic with normal rhythm. S1,S2 noted.  No murmur, rubs or gallops noted.  Pulmonary/Chest: Normal effort and positive vesicular breath sounds. No respiratory distress. No wheezes, rales or ronchi noted.      Assessment & Plan:   Upper Respiratory Infection:  Get some rest and drink plenty of water Do salt water gargles for the sore throat eRx for Azithromax x 5 days eRx for Benzonate for cough  RTC as needed or if symptoms persist.

## 2014-02-22 NOTE — Patient Instructions (Addendum)
Upper Respiratory Infection, Adult An upper respiratory infection (URI) is also sometimes known as the common cold. The upper respiratory tract includes the nose, sinuses, throat, trachea, and bronchi. Bronchi are the airways leading to the lungs. Most people improve within 1 week, but symptoms can last up to 2 weeks. A residual cough may last even longer.  CAUSES Many different viruses can infect the tissues lining the upper respiratory tract. The tissues become irritated and inflamed and often become very moist. Mucus production is also common. A cold is contagious. You can easily spread the virus to others by oral contact. This includes kissing, sharing a glass, coughing, or sneezing. Touching your mouth or nose and then touching a surface, which is then touched by another person, can also spread the virus. SYMPTOMS  Symptoms typically develop 1 to 3 days after you come in contact with a cold virus. Symptoms vary from person to person. They may include:  Runny nose.  Sneezing.  Nasal congestion.  Sinus irritation.  Sore throat.  Loss of voice (laryngitis).  Cough.  Fatigue.  Muscle aches.  Loss of appetite.  Headache.  Low-grade fever. DIAGNOSIS  You might diagnose your own cold based on familiar symptoms, since most people get a cold 2 to 3 times a year. Your caregiver can confirm this based on your exam. Most importantly, your caregiver can check that your symptoms are not due to another disease such as strep throat, sinusitis, pneumonia, asthma, or epiglottitis. Blood tests, throat tests, and X-rays are not necessary to diagnose a common cold, but they may sometimes be helpful in excluding other more serious diseases. Your caregiver will decide if any further tests are required. RISKS AND COMPLICATIONS  You may be at risk for a more severe case of the common cold if you smoke cigarettes, have chronic heart disease (such as heart failure) or lung disease (such as asthma), or if  you have a weakened immune system. The very young and very old are also at risk for more serious infections. Bacterial sinusitis, middle ear infections, and bacterial pneumonia can complicate the common cold. The common cold can worsen asthma and chronic obstructive pulmonary disease (COPD). Sometimes, these complications can require emergency medical care and may be life-threatening. PREVENTION  The best way to protect against getting a cold is to practice good hygiene. Avoid oral or hand contact with people with cold symptoms. Wash your hands often if contact occurs. There is no clear evidence that vitamin C, vitamin E, echinacea, or exercise reduces the chance of developing a cold. However, it is always recommended to get plenty of rest and practice good nutrition. TREATMENT  Treatment is directed at relieving symptoms. There is no cure. Antibiotics are not effective, because the infection is caused by a virus, not by bacteria. Treatment may include:  Increased fluid intake. Sports drinks offer valuable electrolytes, sugars, and fluids.  Breathing heated mist or steam (vaporizer or shower).  Eating chicken soup or other clear broths, and maintaining good nutrition.  Getting plenty of rest.  Using gargles or lozenges for comfort.  Controlling fevers with ibuprofen or acetaminophen as directed by your caregiver.  Increasing usage of your inhaler if you have asthma. Zinc gel and zinc lozenges, taken in the first 24 hours of the common cold, can shorten the duration and lessen the severity of symptoms. Pain medicines may help with fever, muscle aches, and throat pain. A variety of non-prescription medicines are available to treat congestion and runny nose. Your caregiver   can make recommendations and may suggest nasal or lung inhalers for other symptoms.  HOME CARE INSTRUCTIONS   Only take over-the-counter or prescription medicines for pain, discomfort, or fever as directed by your  caregiver.  Use a warm mist humidifier or inhale steam from a shower to increase air moisture. This may keep secretions moist and make it easier to breathe.  Drink enough water and fluids to keep your urine clear or pale yellow.  Rest as needed.  Return to work when your temperature has returned to normal or as your caregiver advises. You may need to stay home longer to avoid infecting others. You can also use a face mask and careful hand washing to prevent spread of the virus. SEEK MEDICAL CARE IF:   After the first few days, you feel you are getting worse rather than better.  You need your caregiver's advice about medicines to control symptoms.  You develop chills, worsening shortness of breath, or brown or red sputum. These may be signs of pneumonia.  You develop yellow or brown nasal discharge or pain in the face, especially when you bend forward. These may be signs of sinusitis.  You develop a fever, swollen neck glands, pain with swallowing, or white areas in the back of your throat. These may be signs of strep throat. SEEK IMMEDIATE MEDICAL CARE IF:   You have a fever.  You develop severe or persistent headache, ear pain, sinus pain, or chest pain.  You develop wheezing, a prolonged cough, cough up blood, or have a change in your usual mucus (if you have chronic lung disease).  You develop sore muscles or a stiff neck. Document Released: 10/06/2000 Document Revised: 07/05/2011 Document Reviewed: 07/18/2013 [employer] Patient Information 2015 [employer]. This information is not intended to replace advice given to you by your health care provider. Make sure you discuss any questions you have with your health care provider.  

## 2014-05-21 ENCOUNTER — Ambulatory Visit (INDEPENDENT_AMBULATORY_CARE_PROVIDER_SITE_OTHER): Payer: 59 | Admitting: Family Medicine

## 2014-05-21 ENCOUNTER — Encounter: Payer: Self-pay | Admitting: Family Medicine

## 2014-05-21 VITALS — BP 130/90 | HR 88 | Temp 98.3°F | Ht 66.0 in | Wt 172.0 lb

## 2014-05-21 DIAGNOSIS — E781 Pure hyperglyceridemia: Secondary | ICD-10-CM

## 2014-05-21 DIAGNOSIS — L989 Disorder of the skin and subcutaneous tissue, unspecified: Secondary | ICD-10-CM

## 2014-05-21 DIAGNOSIS — E119 Type 2 diabetes mellitus without complications: Secondary | ICD-10-CM

## 2014-05-21 DIAGNOSIS — Z23 Encounter for immunization: Secondary | ICD-10-CM

## 2014-05-21 DIAGNOSIS — I10 Essential (primary) hypertension: Secondary | ICD-10-CM

## 2014-05-21 DIAGNOSIS — F320 Major depressive disorder, single episode, mild: Secondary | ICD-10-CM

## 2014-05-21 DIAGNOSIS — E1165 Type 2 diabetes mellitus with hyperglycemia: Secondary | ICD-10-CM

## 2014-05-21 LAB — COMPREHENSIVE METABOLIC PANEL
ALK PHOS: 51 U/L (ref 39–117)
ALT: 10 U/L (ref 0–35)
AST: 13 U/L (ref 0–37)
Albumin: 4.1 g/dL (ref 3.5–5.2)
BUN: 12 mg/dL (ref 6–23)
CALCIUM: 9.2 mg/dL (ref 8.4–10.5)
CO2: 27 meq/L (ref 19–32)
CREATININE: 0.66 mg/dL (ref 0.40–1.20)
Chloride: 104 mEq/L (ref 96–112)
GFR: 103.24 mL/min (ref >60.00)
Glucose, Bld: 63 mg/dL — ABNORMAL LOW (ref 70–99)
POTASSIUM: 4.1 meq/L (ref 3.5–5.1)
SODIUM: 138 meq/L (ref 135–145)
Total Bilirubin: 0.6 mg/dL (ref 0.2–1.2)
Total Protein: 7 g/dL (ref 6.0–8.3)

## 2014-05-21 LAB — LIPID PANEL
CHOLESTEROL: 250 mg/dL — AB (ref 0–200)
HDL: 52.8 mg/dL (ref >39.00)
NonHDL: 197.2
TRIGLYCERIDES: 250 mg/dL — AB (ref 0.0–149.0)
Total CHOL/HDL Ratio: 5
VLDL: 50 mg/dL — ABNORMAL HIGH (ref 0.0–40.0)

## 2014-05-21 LAB — LDL CHOLESTEROL, DIRECT: Direct LDL: 168 mg/dL

## 2014-05-21 LAB — HEMOGLOBIN A1C: HEMOGLOBIN A1C: 7 % — AB (ref 4.6–6.5)

## 2014-05-21 LAB — HM DIABETES FOOT EXAM

## 2014-05-21 MED ORDER — VENLAFAXINE HCL 75 MG PO TABS: ORAL_TABLET | ORAL | Status: DC

## 2014-05-21 MED ORDER — LOSARTAN POTASSIUM-HCTZ 50-12.5 MG PO TABS: ORAL_TABLET | ORAL | Status: DC

## 2014-05-21 MED ORDER — DOXYCYCLINE HYCLATE 100 MG PO TABS: 100.0000 mg | ORAL_TABLET | Freq: Every day | ORAL | Status: DC

## 2014-05-21 MED ORDER — GLIPIZIDE ER 10 MG PO TB24: 10.0000 mg | ORAL_TABLET | Freq: Every day | ORAL | Status: DC

## 2014-05-21 MED ORDER — METFORMIN HCL 500 MG PO TABS: 500.0000 mg | ORAL_TABLET | Freq: Two times a day (BID) | ORAL | Status: DC

## 2014-05-21 NOTE — Assessment & Plan Note (Signed)
Improved. She will consider wean off med.

## 2014-05-21 NOTE — Patient Instructions (Addendum)
Stop at lab on way out. Keep up great work  On healthy eating and exercise. Start doxycycline 100 mg daily x 3 months

## 2014-05-21 NOTE — Assessment & Plan Note (Signed)
Due for re-val off med.

## 2014-05-21 NOTE — Progress Notes (Signed)
Pre visit review using our clinic review tool, if applicable. No additional management support is needed unless otherwise documented below in the visit note. 

## 2014-05-21 NOTE — Assessment & Plan Note (Signed)
Likely much improve control with lifestyle change. Due for re-eval. May be able to soon decrease med doses.

## 2014-05-21 NOTE — Progress Notes (Signed)
44 year old female presents for  DM follow up. She was last seen in 4/.2015 for this issue.  She has noted break out on face, left cheek in last 6 months . Redness swelling.  [facility] Tre[facility] for cellulitis. Started on oral antibiotics. Resolved redness, swelling and pain. No culture or I an D was done. Had head CT.. nml.  Still has lesion..  Has darkened skin, occ fills with white fluids, expresses small amount.  no fever.  Has never resolved.   Depression and anxiety: improved on venlafaxine for 2 years. Doing well an tinterested in coming off med. She will let me know if she would like to taper off.   Diabetes:  Due for re-eval. Previously poorly controlled but was improving. On glipizide 10 and metformin. Lab Results  Component Value Date   HGBA1C 8.9* 06/29/2013  Using medications without difficulties: Not currently Hypoglycemic episodes: None, but occ 65. Hyperglycemic episodes: none Feet problems:None Blood Sugars averaging: FBS 83-100, occ post prandial  < 180 eye exam within last year:  yes Metformin was hurting her stomach so she has spaced it to twice day to avoid SE. She has been very active with remodeling in last year. HAs lost 10 lbs.  Working on healthy eating.   She has been noting itching  and numbness in B hands occ.  Wt Readings from Last 3 Encounters:  05/21/14 172 lb (78.019 kg)  02/22/14 176 lb 12 oz (80.173 kg)  07/31/13 184 lb 4 oz (83.575 kg)   Elevated Cholesterol: Due for re-eval. She stopped taking fenofibrate due to cost.  Lab Results  Component Value Date   CHOL 256* 06/29/2013   HDL 40.30 06/29/2013   LDLCALC 26 06/29/2013   LDLDIRECT 130.0 02/25/2012   TRIG 949.0* 06/29/2013   CHOLHDL 6 06/29/2013  Using medications without problems: None Exercise: Yes. Other complaints:   Hypertension:   Borderline control on losartan HCTZ BP Readings from Last 3 Encounters:  05/21/14 130/90  02/22/14 118/88  07/31/13 138/82  Using  medication without problems or lightheadedness:  None Chest pain with exertion:none Edema:None Short of breath:None Average home BPs: Good. Other issues:    Wt Readings from Last 3 Encounters:  07/31/13 184 lb 4 oz (83.575 kg)  07/06/13 186 lb 8 oz (84.596 kg)  06/20/13 186 lb (84.369 kg)        Review of Systems  Constitutional: Negative for fever and fatigue.  HENT: Negative for ear pain.  Eyes: Negative for pain.  Respiratory: Negative for chest tightness and shortness of breath.  Cardiovascular: Negative for chest pain, palpitations and leg swelling.  Gastrointestinal: Negative for abdominal pain.  Genitourinary: Negative for dysuria.       Objective:   Physical Exam  Constitutional: Vital signs are normal. She appears well-developed and well-nourished. She is cooperative. Non-toxic appearance. She does not appear ill. No distress.  HENT:  Head: Normocephalic.  Right Ear: Hearing, tympanic membrane, external ear and ear canal normal. Tympanic membrane is not erythematous, not retracted and not bulging.  Left Ear: Hearing, tympanic membrane, external ear and ear canal normal. Tympanic membrane is not erythematous, not retracted and not bulging.  Nose: No mucosal edema or rhinorrhea. Right sinus exhibits no maxillary sinus tenderness and no frontal sinus tenderness. Left sinus exhibits no maxillary sinus tenderness and no frontal sinus tenderness.  Mouth/Throat: Uvula is midline, oropharynx is clear and moist and mucous membranes are normal.  Eyes: Conjunctivae, EOM and lids are normal. Pupils are  equal, round, and reactive to light. Lids are everted and swept, no foreign bodies found.  Neck: Trachea normal and normal range of motion. Neck supple. Carotid bruit is not present. No mass and no thyromegaly present.  Cardiovascular: Normal rate, regular rhythm, S1 normal, S2 normal, normal heart sounds, intact distal pulses and normal pulses. Exam reveals no  gallop and no friction rub.  No murmur heard. Pulmonary/Chest: Effort normal and breath sounds normal. Not tachypneic. No respiratory distress. She has no decreased breath sounds. She has no wheezes. She has no rhonchi. She has no rales.  Abdominal: Soft. Normal appearance and bowel sounds are normal. There is no tenderness.  Neurological: She is alert.  Skin: Skin is warm, dry and intact. No rash noted.  Psychiatric: Her speech is normal and behavior is normal. Judgment and thought content normal. Her mood appears not anxious. Cognition and memory are normal. She does not exhibit a depressed mood.   Diabetic foot exam: Normal inspection No skin breakdown No calluses  Normal DP pulses Normal sensation to light touch and monofilament Nails normal

## 2014-05-21 NOTE — Assessment & Plan Note (Signed)
Recurrent inflammation with acne like pustule.  Will treat with doxy x 3 months. Refer to derm if not improving. Disucssed uee of hypoallergenic skin care products. Cetaphil wash.

## 2014-05-22 ENCOUNTER — Telehealth: Payer: Self-pay | Admitting: Family Medicine

## 2014-05-22 NOTE — Telephone Encounter (Signed)
emmi emailed °

## 2014-05-24 ENCOUNTER — Encounter: Payer: Self-pay | Admitting: *Deleted

## 2014-07-05 ENCOUNTER — Ambulatory Visit: Payer: Self-pay | Admitting: General Surgery

## 2014-07-08 ENCOUNTER — Encounter: Payer: Self-pay | Admitting: General Surgery

## 2014-07-15 ENCOUNTER — Ambulatory Visit (INDEPENDENT_AMBULATORY_CARE_PROVIDER_SITE_OTHER): Payer: 59 | Admitting: General Surgery

## 2014-07-15 ENCOUNTER — Encounter: Payer: Self-pay | Admitting: General Surgery

## 2014-07-15 VITALS — BP 134/72 | HR 76 | Resp 12 | Ht 66.0 in | Wt 167.0 lb

## 2014-07-15 DIAGNOSIS — Z9889 Other specified postprocedural states: Secondary | ICD-10-CM | POA: Diagnosis not present

## 2014-07-15 DIAGNOSIS — Z803 Family history of malignant neoplasm of breast: Secondary | ICD-10-CM

## 2014-07-15 NOTE — Progress Notes (Signed)
Patient ID: Alyssa Warren, female   DOB: [birth year], 44 y.o.   MRN: [id number]  Chief Complaint  Patient presents with  . Follow-up    mammogram    HPI Alyssa Warren is a 44 y.o. female who presents for a breast evaluation. The most recent mammogram was done on 07/05/14.  Patient does perform regular self breast checks and gets regular mammograms done.    HPI  Past Medical History  Diagnosis Date  . Allergy   . Hypertension   . Diabetes mellitus without complication     Past Surgical History  Procedure Laterality Date  . Tubal ligation    . Plantar fasciitis release Bilateral   . Knee surgery Left   . Cyst removed from ovary      Family History  Problem Relation Age of Onset  . Breast cancer Maternal Aunt     great Aunts   . Breast cancer Paternal Aunt     Social History History  Substance Use Topics  . Smoking status: Current Every Day Smoker -- 0.30 packs/day    Types: Cigarettes  . Smokeless tobacco: Never Used  . Alcohol Use: No    Allergies  Allergen Reactions  . Methyldopa     REACTION: unspecified  . Penicillins     REACTION: Hives, itchy    Current Outpatient Prescriptions  Medication Sig Dispense Refill  . doxycycline (VIBRA-TABS) 100 MG tablet Take 1 tablet (100 mg total) by mouth daily. 90 tablet 0  . fenofibrate 160 MG tablet Take 1 tablet (160 mg total) by mouth daily. 90 tablet 1  . glipiZIDE (GLUCOTROL XL) 10 MG 24 hr tablet Take 1 tablet (10 mg total) by mouth daily with breakfast. 90 tablet 3  . glucose blood (ACCU-CHEK SMARTVIEW) test strip Use to check blood sugar one to two times daily.  Dx: 250.00 100 each 5  . losartan-hydrochlorothiazide (HYZAAR) 50-12.5 MG per tablet TAKE 1 TABLET BY MOUTH DAILY 90 tablet 3  . metFORMIN (GLUCOPHAGE) 500 MG tablet Take 1 tablet (500 mg total) by mouth 2 (two) times daily with a meal. 180 tablet 3  . Multiple Vitamin (MULTIVITAMIN) tablet Take 1 tablet by mouth daily.      Marland Kitchen  venlafaxine (EFFEXOR) 75 MG tablet TAKE 1 TABLET BY MOUTH DAILY 90 tablet 1   No current facility-administered medications for this visit.    Review of Systems Review of Systems  Constitutional: Negative.   Respiratory: Negative.   Cardiovascular: Negative.     Blood pressure 134/72, pulse 76, resp. rate 12, height 5\' 6"  (1.676 m), weight 167 lb (75.751 kg), last menstrual period 07/04/2014.  Physical Exam Physical Exam  Constitutional: She is oriented to person, place, and time. She appears well-developed and well-nourished.  Eyes: Conjunctivae are normal. No scleral icterus.  Neck: Neck supple.  Cardiovascular: Normal rate, regular rhythm and normal heart sounds.   Pulmonary/Chest: Effort normal and breath sounds normal. Right breast exhibits no inverted nipple, no mass, no nipple discharge, no skin change and no tenderness. Left breast exhibits no inverted nipple, no mass, no nipple discharge, no skin change and no tenderness.  Abdominal: Soft. Normal appearance and bowel sounds are normal. There is no hepatomegaly. There is no tenderness. No hernia.  Lymphadenopathy:    She has no cervical adenopathy.    She has no axillary adenopathy.  Neurological: She is alert and oriented to person, place, and time.  Skin: Skin is warm and dry.    Data Reviewed Mammogram  reviewed-stable. Office notes reviewed.   Assessment    Benign breast mass. Remote family history of breast cancer.    Plan    The patient has been asked to return to the office in one year with a bilateral screening mammogram.       Nicholl Onstott G 07/17/2014, 9:38 AM

## 2014-07-16 LAB — HM DIABETES EYE EXAM

## 2014-07-17 ENCOUNTER — Encounter: Payer: Self-pay | Admitting: Family Medicine

## 2014-09-20 ENCOUNTER — Encounter: Payer: 59 | Admitting: Family Medicine

## 2014-10-22 ENCOUNTER — Other Ambulatory Visit: Payer: Self-pay | Admitting: Family Medicine

## 2014-10-25 ENCOUNTER — Encounter: Payer: 59 | Admitting: Family Medicine

## 2014-10-29 ENCOUNTER — Encounter: Payer: Self-pay | Admitting: Family Medicine

## 2014-10-29 DIAGNOSIS — L989 Disorder of the skin and subcutaneous tissue, unspecified: Secondary | ICD-10-CM

## 2014-11-13 ENCOUNTER — Encounter: Payer: Self-pay | Admitting: Family Medicine

## 2014-11-15 ENCOUNTER — Encounter: Payer: Self-pay | Admitting: Family Medicine

## 2014-11-15 ENCOUNTER — Ambulatory Visit (INDEPENDENT_AMBULATORY_CARE_PROVIDER_SITE_OTHER): Payer: 59 | Admitting: Family Medicine

## 2014-11-15 VITALS — BP 140/90 | HR 84 | Temp 98.8°F | Ht 66.0 in | Wt 177.5 lb

## 2014-11-15 DIAGNOSIS — L739 Follicular disorder, unspecified: Secondary | ICD-10-CM | POA: Diagnosis not present

## 2014-11-15 MED ORDER — DOXYCYCLINE HYCLATE 100 MG PO TABS: ORAL_TABLET | ORAL | Status: DC

## 2014-11-15 NOTE — Progress Notes (Signed)
   Subjective:    Patient ID: Alyssa Warren, female    DOB: November 19, 1970, 44 y.o.   MRN: [id number]  HPI  44 year old female with well controlled DM presents for evaluation of spots on legs, ingrown hairs.  After recent trip to beach on June, salt water pool... Noted lesions on legs, ingrown hairs pushed out.  Now some on low back  and buttock. Not healing Clear, white discharge. Occ itchy.   No fever.   She has been treating with peroxide, neosporin daily, aloe.   Uses Dove soap.    Lab Results  Component Value Date   HGBA1C 7.0* 05/21/2014   Social History /Family History/Past Medical History reviewed and updated if needed. Last year was admitted for abscess on left face. No culture sent. Had cyst.. improved with 3 months of doxy but has started with discharge again for lesion on face in last week.      Review of Systems  Constitutional: Negative for fever and fatigue.  HENT: Negative for ear pain.   Eyes: Negative for pain.  Respiratory: Negative for chest tightness and shortness of breath.   Cardiovascular: Negative for chest pain, palpitations and leg swelling.  Gastrointestinal: Negative for abdominal pain.  Genitourinary: Negative for dysuria.       Objective:   Physical Exam  Constitutional: Vital signs are normal. She appears well-developed and well-nourished. She is cooperative.  Non-toxic appearance. She does not appear ill. No distress.  HENT:  Head: Normocephalic.  Right Ear: Hearing, tympanic membrane, external ear and ear canal normal. Tympanic membrane is not erythematous, not retracted and not bulging.  Left Ear: Hearing, tympanic membrane, external ear and ear canal normal. Tympanic membrane is not erythematous, not retracted and not bulging.  Nose: No mucosal edema or rhinorrhea. Right sinus exhibits no maxillary sinus tenderness and no frontal sinus tenderness. Left sinus exhibits no maxillary sinus tenderness and no frontal sinus  tenderness.  Mouth/Throat: Uvula is midline, oropharynx is clear and moist and mucous membranes are normal.  Eyes: Conjunctivae, EOM and lids are normal. Pupils are equal, round, and reactive to light. Lids are everted and swept, no foreign bodies found.  Neck: Trachea normal and normal range of motion. Neck supple. Carotid bruit is not present. No thyroid mass and no thyromegaly present.  Cardiovascular: Normal rate, regular rhythm, S1 normal, S2 normal, normal heart sounds, intact distal pulses and normal pulses.  Exam reveals no gallop and no friction rub.   No murmur heard. Pulmonary/Chest: Effort normal and breath sounds normal. No tachypnea. No respiratory distress. She has no decreased breath sounds. She has no wheezes. She has no rhonchi. She has no rales.  Abdominal: Soft. Normal appearance and bowel sounds are normal. There is no tenderness.  Neurological: She is alert.  Skin: Skin is warm, dry and intact. No rash noted.  Left cheeck with no redness, small opening of cyst, no discharge, left neck with cyst non inflammed under skin  Multiple eryhtematous papule, some excoriated on bilatereal legs, back and buttocks.  Psychiatric: Her speech is normal and behavior is normal. Judgment and thought content normal. Her mood appears not anxious. Cognition and memory are normal. She does not exhibit a depressed mood.          Assessment & Plan:

## 2014-11-15 NOTE — Progress Notes (Signed)
Pre visit review using our clinic review tool, if applicable. No additional management support is needed unless otherwise documented below in the visit note. 

## 2014-11-15 NOTE — Assessment & Plan Note (Addendum)
Pt is colonized likely with staph.  Treat with 10 days full dose doxy then go to 1 tab daily. Start home bleach regimin as disucssed.  Refer to derm for further treatment.

## 2014-11-15 NOTE — Patient Instructions (Signed)
Restart doxycycline  X 3 months, until Dermatologist appt.  Wash all clothes, sheets you can in bleach. Discard razor , get new ones or bleach.  For new few weeks 2 per week take bleach baths as instructed. Change body/hand /face soap to Lever 2000 or Dial or antibacterial component.

## 2014-11-22 ENCOUNTER — Other Ambulatory Visit: Payer: Self-pay | Admitting: Family Medicine

## 2014-12-01 ENCOUNTER — Emergency Department (HOSPITAL_COMMUNITY)
Admission: EM | Admit: 2014-12-01 | Discharge: 2014-12-01 | Disposition: A | Discharge status: Home / Self Care | Payer: 59 | Attending: Emergency Medicine | Admitting: Emergency Medicine

## 2014-12-01 ENCOUNTER — Encounter (HOSPITAL_COMMUNITY): Payer: Self-pay | Admitting: *Deleted

## 2014-12-01 DIAGNOSIS — Z880 Allergy status to penicillin: Secondary | ICD-10-CM | POA: Insufficient documentation

## 2014-12-01 DIAGNOSIS — Z79899 Other long term (current) drug therapy: Secondary | ICD-10-CM | POA: Diagnosis not present

## 2014-12-01 DIAGNOSIS — E119 Type 2 diabetes mellitus without complications: Secondary | ICD-10-CM | POA: Insufficient documentation

## 2014-12-01 DIAGNOSIS — R51 Headache: Secondary | ICD-10-CM

## 2014-12-01 DIAGNOSIS — Z720 Tobacco use: Secondary | ICD-10-CM | POA: Insufficient documentation

## 2014-12-01 DIAGNOSIS — I10 Essential (primary) hypertension: Secondary | ICD-10-CM | POA: Insufficient documentation

## 2014-12-01 DIAGNOSIS — R519 Headache, unspecified: Secondary | ICD-10-CM

## 2014-12-01 DIAGNOSIS — J321 Chronic frontal sinusitis: Secondary | ICD-10-CM | POA: Diagnosis not present

## 2014-12-01 MED ORDER — OXYCODONE-ACETAMINOPHEN 5-325 MG PO TABS
1.0000 | ORAL_TABLET | Freq: Once | ORAL | Status: AC
  Administered 2014-12-01: 1 via ORAL

## 2014-12-01 MED ORDER — METOCLOPRAMIDE HCL 5 MG PO TABS
5.0000 mg | ORAL_TABLET | Freq: Once | ORAL | Status: DC
  Filled 2014-12-01: qty 1

## 2014-12-01 MED ORDER — OXYCODONE-ACETAMINOPHEN 5-325 MG PO TABS
ORAL_TABLET | ORAL | Status: AC
  Filled 2014-12-01: qty 1

## 2014-12-01 MED ORDER — DIPHENHYDRAMINE HCL 25 MG PO CAPS
25.0000 mg | ORAL_CAPSULE | Freq: Once | ORAL | Status: AC
  Administered 2014-12-01: 25 mg via ORAL
  Filled 2014-12-01: qty 1

## 2014-12-01 MED ORDER — KETOROLAC TROMETHAMINE 30 MG/ML IJ SOLN
30.0000 mg | Freq: Once | INTRAMUSCULAR | Status: AC
  Administered 2014-12-01: 30 mg via INTRAMUSCULAR
  Filled 2014-12-01: qty 1

## 2014-12-01 MED ORDER — PROCHLORPERAZINE MALEATE 5 MG PO TABS
5.0000 mg | ORAL_TABLET | Freq: Once | ORAL | Status: AC
  Administered 2014-12-01: 5 mg via ORAL
  Filled 2014-12-01: qty 1

## 2014-12-01 MED ORDER — DIPHENHYDRAMINE HCL 50 MG/ML IJ SOLN
25.0000 mg | Freq: Once | INTRAMUSCULAR | Status: DC
  Filled 2014-12-01: qty 1

## 2014-12-01 NOTE — ED Notes (Signed)
Pt reports being treated for ongoing bacterial skin infection. Sudden onset today of left side sharp head pains and neck pain. Denies fever.

## 2014-12-01 NOTE — ED Provider Notes (Signed)
CSN: [id number]     Arrival date & time 12/01/14  1754 History   First MD Initiated Contact with Patient 12/01/14 1911     Chief Complaint  Patient presents with  . Headache   HPI   44 year old female presents today with numerous complaints. Patient's main complaint is frontal sinus pain and pressure. Patient reports symptoms have been present for greater than a year, she describes this as left frontal sinus and maxillary sinus pressure and pain. She reports this pain radiates into her jaw. She states this is been waxing and waning for greater than a year now, reports that today she had an abrupt change in the pressure with "sharp pains". Patient denies fever, head trauma, neck stiffness, changes in smell vision or taste, focal neurological deficits, or any other red flags. Patient does report pain into the left lateral neck, reports this is usually associated with her frontal sinus pressure. Patient reports a significant past medical history of recurrent skin infections. She is being followed for this by her primary care and recently saw a dermatologist. Currently taking doxycycline for this. She reports that she has a spot on her left cheek, she reports this continues to grow larger interrupted. She reports the cycle continues, and feels this is the source of her left-sided face and head pain. Patient reports that she does have some rhinorrhea, denies cough. Patient reports numerous lesions throughout her body.    Past Medical History  Diagnosis Date  . Allergy   . Hypertension   . Diabetes mellitus without complication    Past Surgical History  Procedure Laterality Date  . Tubal ligation    . Plantar fasciitis release Bilateral   . Knee surgery Left   . Cyst removed from ovary     Family History  Problem Relation Age of Onset  . Breast cancer Maternal Aunt     great Aunts   . Breast cancer Paternal Aunt    History  Substance Use Topics  . Smoking status: Current Every Day Smoker --  0.30 packs/day    Types: Cigarettes  . Smokeless tobacco: Never Used  . Alcohol Use: No   OB History    No data available     Review of Systems  All other systems reviewed and are negative.  Allergies  Penicillins and Methyldopa  Home Medications   Prior to Admission medications   Medication Sig Start Date End Date Taking? Authorizing Provider  doxycycline (VIBRA-TABS) 100 MG tablet Start BID x 10 days then go to 1 tab po daily Patient taking differently: Take 100 mg by mouth 2 (two) times daily.  11/15/14  Yes Amy Cletis Athens, MD  glipiZIDE (GLUCOTROL XL) 10 MG 24 hr tablet Take 1 tablet (10 mg total) by mouth daily with breakfast. Patient taking differently: Take 10 mg by mouth every evening.  05/21/14  Yes Amy Cletis Athens, MD  losartan-hydrochlorothiazide (HYZAAR) 50-12.5 MG per tablet TAKE 1 TABLET BY MOUTH DAILY 05/21/14  Yes Amy Cletis Athens, MD  metFORMIN (GLUCOPHAGE) 500 MG tablet Take 1 tablet (500 mg total) by mouth 2 (two) times daily with a meal. 05/21/14  Yes Amy E Bedsole, MD  Multiple Vitamin (MULTIVITAMIN) tablet Take 1 tablet by mouth daily.     Yes Historical Provider, MD  naproxen sodium (ALEVE) 220 MG tablet Take 220 mg by mouth 2 (two) times daily as needed (for pain).   Yes Historical Provider, MD  venlafaxine (EFFEXOR) 75 MG tablet Take 1 tablet by mouth  daily 10/22/14  Yes Amy Cletis Athens, MD  fenofibrate 160 MG tablet Take 1 tablet (160 mg total) by mouth daily. 09/04/13   Amy Cletis Athens, MD  glucose blood (ACCU-CHEK SMARTVIEW) test strip Use to check blood sugar one to two times daily.  Dx: 250.00 09/19/13   Amy E Diona Browner, MD   BP 116/77 mmHg  Pulse 77  Temp(Src) 98.7 F (37.1 C) (Oral)  Resp 18  Ht 5\' 6"  (1.676 m)  Wt 174 lb (78.926 kg)  BMI 28.10 kg/m2  SpO2 96%  LMP 11/07/2014   Physical Exam  Constitutional: She is oriented to person, place, and time. She appears well-developed and well-nourished.  HENT:  Head: Normocephalic and atraumatic.  Right Ear:  Hearing, tympanic membrane, external ear and ear canal normal.  Left Ear: Hearing, tympanic membrane, external ear and ear canal normal.  Nose: Nose normal. No mucosal edema or rhinorrhea.  Mouth/Throat: Uvula is midline, oropharynx is clear and moist and mucous membranes are normal.  Small area of inflammation to the left cheek, no surrounding signs of infection.. Patient is tender to percussion over the left maxillary and frontal sinuses. Nares are patent.   Eyes: Conjunctivae are normal. Pupils are equal, round, and reactive to light. Right eye exhibits no discharge. Left eye exhibits no discharge. No scleral icterus.  Neck: Normal range of motion. No JVD present. No tracheal deviation present.  Pulmonary/Chest: Effort normal. No stridor.  Neurological: She is alert and oriented to person, place, and time. She has normal strength. No cranial nerve deficit or sensory deficit. Coordination normal. GCS eye subscore is 4. GCS verbal subscore is 5. GCS motor subscore is 6.  Psychiatric: She has a normal mood and affect. Her behavior is normal. Judgment and thought content normal.  Nursing note and vitals reviewed.     ED Course  Procedures (including critical care time) Labs Review Labs Reviewed - No data to display  Imaging Review No results found.   EKG Interpretation None      MDM   Final diagnoses:  Acute nonintractable headache, unspecified headache type  Frontal sinusitis, unspecified chronicity    Labs:   Imaging:  Consults:  Therapeutics:  Discharge Meds:   Assessment/Plan: Patient presents with numerous complaints today. She has ongoing skin infections for which she is being followed by dermatologist. Patient has expressed her complaints of both her sinus pressure and pain, and skin issues to her dermatologist and primary care provider who are managing the current condition. Patient's sinus pressure and headache pain have been present for greater than a year per her  and her mother. She has minimal upper respiratory symptoms, she is afebrile. Patient had a small area of what appeared to be inflammation on her left cheek, ultrasound showed no abscess present. Patient's complaints have been chronic in nature, she is and happy with the care she is receiving in her dermatologist office. I believe that her current condition has a sinus component and that she needs to follow up with your nose and throat for further evaluation and management. Patient has not seen an ENT specialist, I gave her the follow-up information, she assured her follow-up evaluation. I instructed her to contact them immediately and schedule follow-up appointment as soon as possible. Patient had no red flags for headache, was afebrile, and her vital signs were reassuring. She was given headache cocktail here in the ED with symptomatic improvement. Patient was discharged home with strict return precautions, verbalized understanding and agreement for today's plan  and had no further questions or concerns at the time of discharge.         Okey Regal, PA-C 12/03/14 1801  Sherwood Gambler, MD 12/10/14 [phone]

## 2014-12-01 NOTE — Discharge Instructions (Signed)
Please follow-up with your primary care provider inform them of your visit today. Please follow-up with ENT specialist further evaluation and management of chronic condition. Please monitor for new or worsening signs or symptoms, return immediately if any present.

## 2014-12-02 ENCOUNTER — Telehealth: Payer: Self-pay

## 2014-12-02 NOTE — Telephone Encounter (Signed)
Seen at [facility] ED 12/01/14.

## 2014-12-02 NOTE — Telephone Encounter (Signed)
PLEASE NOTE: All timestamps contained within this report are represented as [place] Standard Time. CONFIDENTIALTY NOTICE: This fax transmission is intended only for the addressee. It contains information that is legally privileged, confidential or otherwise protected from use or disclosure. If you are not the intended recipient, you are strictly prohibited from reviewing, disclosing, copying using or disseminating any of this information or taking any action in reliance on or regarding this information. If you have received this fax in error, please notify us immediately by telephone so that we can arrange for its return to [place]. Phone: [phone], Toll-Free: [phone], Fax: [phone] Page: 1 of 2 Call Id: [id number] Solomon Patient Name: Alyssa Warren Gender: Female DOB: 06-14-1970 Age: 44 Y 10 M 15 D Return Phone Number: [phone] (Primary) Address: City/State/Zip: [place] [address] Client [facility] Night - Client Client Site [place] Physician Diona Browner, [place] Contact Type Call Call Type Triage / Clinical Relationship To Patient Self Return Phone Number [phone] (Primary) Chief Complaint Facial Pain Initial Comment Bacterial infection, left side of face and neck is hurting. Taking abx. PreDisposition Go to ED Nurse Assessment Nurse: Martyn Ehrich, RN, Solmon Ice Date/Time Eilene Ghazi Time): 12/01/2014 3:49:51 PM Confirm and document reason for call. If symptomatic, describe symptoms. ---PT is on antx for infection in her skin - pops up here and there. Dealing with it for a year. No new ones. One has been on Left face that is getting more painful. Saw MD 2 wks ago and was put back on the antx for 2 wks. SHe has appt with her dermatology referal Mon. Now severe HA and constant pain in face. The other places on body are healing with  bleach baths. No fever Has the patient traveled out of the country within the last 30 days? ---No Does the patient require triage? ---Yes Related visit to physician within the last 2 weeks? ---No Does the PT have any chronic conditions? (i.e. diabetes, asthma, etc.) ---Yes List chronic conditions. ---DM - folliculitis, HTN Did the patient indicate they were pregnant? ---No Guidelines Guideline Title Affirmed Question Affirmed Notes Nurse Date/Time Eilene Ghazi Time) Sinus Pain or Congestion [1] SEVERE pain AND [2] not improved 2 hours after pain medicine Gaddy, RN, Solmon Ice 11/26/9935 1:69:67 PM Disp. Time Eilene Ghazi Time) Disposition Final User 12/01/2014 4:00:18 PM See Physician within 4 Hours (or PCP triage) Yes Gaddy, RN, Alphia Kava Understands: Yes PLEASE NOTE: All timestamps contained within this report are represented as [place] Standard Time. CONFIDENTIALTY NOTICE: This fax transmission is intended only for the addressee. It contains information that is legally privileged, confidential or otherwise protected from use or disclosure. If you are not the intended recipient, you are strictly prohibited from reviewing, disclosing, copying using or disseminating any of this information or taking any action in reliance on or regarding this information. If you have received this fax in error, please notify us immediately by telephone so that we can arrange for its return to [place]. Phone: [phone], Toll-Free: [phone], Fax: [phone] Page: 2 of 2 Call Id: [id number] Disagree/Comply: Comply Care Advice Given Per Guideline SEE PHYSICIAN WITHIN 4 HOURS (or PCP triage): * Another choice is to take 1,000 mg (two 500 mg pills) every 8 hours as needed. Each Extra Strength Tylenol pill has 500 mg of acetaminophen. The most you should take each day is 3,000 mg (6 Extra Strength pills a day). * Take 400 mg (  two 200 mg pills) by mouth every 6 hours as needed. * Do not take NSAID medications if  you are pregnant. * Do not take nonsteroidal anti-inflammatory drugs (NSAIDs) if you have stomach problems, kidney disease, heart failure, or other contraindications to using this type of medication. * CARDIOVASCULAR RISK: There may be an increased risk of heart attack and stroke. * GASTROINTESTINAL RISK: There is an increased risk of stomach ulcers, GI bleeding, perforation. * You may take this medicine with or without food. Taking it with food or milk may lessen the chance the drug will upset your stomach. * The most you should take each day is 1,200 mg (six 200 mg pills a day), unless your doctor has told you to take more. * You become worse. LOCAL COLD: For sinus pain unresponsive to pain medicine and nasal washes, apply a cold pack or ice in a wet washcloth for 20 minutes four times a day as needed. After Care Instructions Given Call Event Type User Date / Time Description Comments User: Daphene Calamity, RN Date/Time Eilene Ghazi Time): 12/01/2014 3:55:55 PM the face is swollen without pimples - feels like infection on her cheekbone that wont come to the surface. Red L cheek bone. No nasal discharge but entire L side of head is stopped up - no swelling of lymph nodes no swelling of neck. Pain in L neck - headache is worse at level 8 User: Daphene Calamity, RN Date/Time Eilene Ghazi Time): 12/01/2014 3:56:25 PM NO fever - pain is face and head Referrals GO TO FACILITY UNDECIDED

## 2014-12-03 NOTE — Telephone Encounter (Signed)
Call pt to get an update.

## 2014-12-03 NOTE — Telephone Encounter (Signed)
Went to derm. They wanted her to continue doxy and bleach baths and cream x 6 weeks. Two days ago developed sharp pain that really bothered her to the point of the ER visit. PA there said it was sinuses and wanted ENT consult. Saw Dr. Tami Ribas ENT yesterday, had sinus CT which was clear and the ENT thinks staph infection and wants her to see ID. Can't get in there until 12/13/14. He made her stop all abx in the meantime and did a culture. She is still having the horrible pain to her face and HA and the sores on her face. She is very concerned and wants to know what she should do if she continues to get worse before the ID appt. Advised pt to call here once she gets the culture results and I advised that we would call her with your recommendations.

## 2014-12-03 NOTE — Telephone Encounter (Signed)
Patient advised.   Patient says she is already taking the IBP 800 mg every 8 hours and it is not helping.  Patient says she can feel this sitting right over her eye and it is very painful.  Her jaw is swelling also.

## 2014-12-03 NOTE — Telephone Encounter (Signed)
I don't have any further recommendations other than she can use ibuprofen 800 mg every 8 hours for pain. If pain not controlled, she can call to let me know.

## 2014-12-04 ENCOUNTER — Encounter (HOSPITAL_COMMUNITY): Payer: Self-pay

## 2014-12-04 ENCOUNTER — Emergency Department (HOSPITAL_COMMUNITY)
Admission: EM | Admit: 2014-12-04 | Discharge: 2014-12-04 | Disposition: A | Discharge status: Home / Self Care | Payer: 59 | Attending: Emergency Medicine | Admitting: Emergency Medicine

## 2014-12-04 DIAGNOSIS — R51 Headache: Secondary | ICD-10-CM | POA: Diagnosis not present

## 2014-12-04 DIAGNOSIS — M542 Cervicalgia: Secondary | ICD-10-CM | POA: Insufficient documentation

## 2014-12-04 DIAGNOSIS — Z880 Allergy status to penicillin: Secondary | ICD-10-CM | POA: Insufficient documentation

## 2014-12-04 DIAGNOSIS — E119 Type 2 diabetes mellitus without complications: Secondary | ICD-10-CM | POA: Diagnosis not present

## 2014-12-04 DIAGNOSIS — Z720 Tobacco use: Secondary | ICD-10-CM | POA: Insufficient documentation

## 2014-12-04 DIAGNOSIS — I10 Essential (primary) hypertension: Secondary | ICD-10-CM | POA: Insufficient documentation

## 2014-12-04 DIAGNOSIS — L739 Follicular disorder, unspecified: Secondary | ICD-10-CM | POA: Insufficient documentation

## 2014-12-04 DIAGNOSIS — L989 Disorder of the skin and subcutaneous tissue, unspecified: Secondary | ICD-10-CM | POA: Insufficient documentation

## 2014-12-04 DIAGNOSIS — Z79899 Other long term (current) drug therapy: Secondary | ICD-10-CM | POA: Insufficient documentation

## 2014-12-04 DIAGNOSIS — R21 Rash and other nonspecific skin eruption: Secondary | ICD-10-CM | POA: Diagnosis present

## 2014-12-04 LAB — CBC WITH DIFFERENTIAL/PLATELET
Basophils Absolute: 0 10*3/uL (ref 0.0–0.1)
Basophils Relative: 0 % (ref 0–1)
Eosinophils Absolute: 0.2 10*3/uL (ref 0.0–0.7)
Eosinophils Relative: 2 % (ref 0–5)
HCT: 42 % (ref 36.0–46.0)
HEMOGLOBIN: 14.1 g/dL (ref 12.0–15.0)
LYMPHS ABS: 2.7 10*3/uL (ref 0.7–4.0)
Lymphocytes Relative: 40 % (ref 12–46)
MCH: 30.5 pg (ref 26.0–34.0)
MCHC: 33.6 g/dL (ref 30.0–36.0)
MCV: 90.7 fL (ref 78.0–100.0)
MONOS PCT: 5 % (ref 3–12)
Monocytes Absolute: 0.3 10*3/uL (ref 0.1–1.0)
NEUTROS ABS: 3.5 10*3/uL (ref 1.7–7.7)
Neutrophils Relative %: 53 % (ref 43–77)
Platelets: 258 10*3/uL (ref 150–400)
RBC: 4.63 MIL/uL (ref 3.87–5.11)
RDW: 14 % (ref 11.5–15.5)
WBC: 6.7 10*3/uL (ref 4.0–10.5)

## 2014-12-04 LAB — HIV ANTIBODY (ROUTINE TESTING W REFLEX): HIV Screen 4th Generation wRfx: NONREACTIVE

## 2014-12-04 MED ORDER — PROCHLORPERAZINE EDISYLATE 5 MG/ML IJ SOLN
10.0000 mg | Freq: Four times a day (QID) | INTRAMUSCULAR | Status: DC | PRN
  Administered 2014-12-04: 10 mg via INTRAVENOUS
  Filled 2014-12-04: qty 2

## 2014-12-04 MED ORDER — CEPHALEXIN 250 MG PO CAPS
500.0000 mg | ORAL_CAPSULE | Freq: Two times a day (BID) | ORAL | Status: DC
  Administered 2014-12-04: 500 mg via ORAL
  Filled 2014-12-04: qty 2

## 2014-12-04 MED ORDER — DIPHENHYDRAMINE HCL 50 MG/ML IJ SOLN
12.5000 mg | Freq: Once | INTRAMUSCULAR | Status: AC
  Administered 2014-12-04: 12.5 mg via INTRAVENOUS
  Filled 2014-12-04: qty 1

## 2014-12-04 MED ORDER — TRAMADOL HCL 50 MG PO TABS: 50.0000 mg | ORAL_TABLET | Freq: Four times a day (QID) | ORAL | Status: DC | PRN

## 2014-12-04 MED ORDER — KETOROLAC TROMETHAMINE 15 MG/ML IJ SOLN
15.0000 mg | Freq: Once | INTRAMUSCULAR | Status: AC
  Administered 2014-12-04: 15 mg via INTRAVENOUS
  Filled 2014-12-04: qty 1

## 2014-12-04 MED ORDER — CEPHALEXIN 500 MG PO CAPS: 500.0000 mg | ORAL_CAPSULE | Freq: Two times a day (BID) | ORAL | Status: DC

## 2014-12-04 NOTE — Discharge Instructions (Signed)
We will discharge you with Keflex per ID recommendations, which should cover the two most common causes of skin infections (Stapholococcus and Streptococcus). Please follow up with ID next week.  Folliculitis  Folliculitis is redness, soreness, and swelling (inflammation) of the hair follicles. This condition can occur anywhere on the body. People with weakened immune systems, diabetes, or obesity have a greater risk of getting folliculitis. CAUSES  Bacterial infection. This is the most common cause.  Fungal infection.  Viral infection.  Contact with certain chemicals, especially oils and tars. Long-term folliculitis can result from bacteria that live in the nostrils. The bacteria may trigger multiple outbreaks of folliculitis over time. SYMPTOMS Folliculitis most commonly occurs on the scalp, thighs, legs, back, buttocks, and areas where hair is shaved frequently. An early sign of folliculitis is a small, white or yellow, pus-filled, itchy lesion (pustule). These lesions appear on a red, inflamed follicle. They are usually less than 0.2 inches (5 mm) wide. When there is an infection of the follicle that goes deeper, it becomes a boil or furuncle. A group of closely packed boils creates a larger lesion (carbuncle). Carbuncles tend to occur in hairy, sweaty areas of the body. DIAGNOSIS  Your caregiver can usually tell what is wrong by doing a physical exam. A sample may be taken from one of the lesions and tested in a lab. This can help determine what is causing your folliculitis. TREATMENT  Treatment may include:  Applying warm compresses to the affected areas.  Taking antibiotic medicines orally or applying them to the skin.  Draining the lesions if they contain a large amount of pus or fluid.  Laser hair removal for cases of long-lasting folliculitis. This helps to prevent regrowth of the hair. HOME CARE INSTRUCTIONS  Apply warm compresses to the affected areas as directed by your  caregiver.  If antibiotics are prescribed, take them as directed. Finish them even if you start to feel better.  You may take over-the-counter medicines to relieve itching.  Do not shave irritated skin.  Follow up with your caregiver as directed. SEEK IMMEDIATE MEDICAL CARE IF:   You have increasing redness, swelling, or pain in the affected area.  You have a fever. MAKE SURE YOU:  Understand these instructions.  Will watch your condition.  Will get help right away if you are not doing well or get worse. Document Released: 06/21/2001 Document Revised: 10/12/2011 Document Reviewed: 07/13/2011 [facility] Patient Information 2015 [place], [place]. This information is not intended to replace advice given to you by your health care provider. Make sure you discuss any questions you have with your health care provider.

## 2014-12-04 NOTE — Telephone Encounter (Signed)
Call in tramadol 50 mg 1 tab po every 6 hours prn moderate to severe pain   #30, 0 RF Was the culture of the sinuses or from her skin?  She needs to call ASAP with culture results.

## 2014-12-04 NOTE — ED Provider Notes (Signed)
CSN: [id number]     Arrival date & time 12/04/14  5790 History   First MD Initiated Contact with Patient 12/04/14 [id number]     Chief Complaint  Patient presents with  . Rash   HPI  Alyssa Warren is a 44yo female presenting with rash. Reports multiple red scabs to arms, legs, and back. Had been taking Doxycycline per dermatology, but instructed on 8/8 by ENT to discontinue antibiotics. States lesion on left cheek has been present x76yr without improvement. Now has lesions extending down left neck, on chest, back, and legs bilaterally. Does not feel that lesions on the rest of her body are the same as the lesion on her face. Lesion on face is tender and occasionally itchy while those over rest of body are minimally tender. Has also noted left sided sinus symptoms and headache over the last year. Feels that lesion on face is extending in and compressing a nerve causing the pain. Had been instructed by dermatology to take Doxycycline and bleach baths. Was recently seen by ENT on 8/8 and told to discontinue Doxycycline since that would not treat Staph infection they suspect she has. Was not given any additional antibiotics.   Recently seen on 8/7 for left frontal sinus pain and pressure x41yr.  Also reported skin lesion on left cheek and numerous lesions all over body. US showed no signs of abscess over left cheek. Instructed to follow up with ENT. Saw ENT on 8/8 and obtained sinus CT which was clear. ENT believes symptoms are secondary to a staph infection and instructed her to follow up with ID.  Office visit from 7/22 also shows complain of spots on legs, low back, and buttocks secondary to ingrown hairs. Lesions occasionally itchy with clear white discharge. Diagnosed with Folliculitis. History of colonized with staph. Initiated on Doxycycline at this time and referred to Dermatology.  Past Medical History  Diagnosis Date  . Allergy   . Hypertension   . Diabetes mellitus without complication    Past  Surgical History  Procedure Laterality Date  . Tubal ligation    . Plantar fasciitis release Bilateral   . Knee surgery Left   . Cyst removed from ovary     Family History  Problem Relation Age of Onset  . Breast cancer Maternal Aunt     great Aunts   . Breast cancer Paternal Aunt    Social History  Substance Use Topics  . Smoking status: Current Every Day Smoker -- 0.30 packs/day    Types: Cigarettes  . Smokeless tobacco: Never Used  . Alcohol Use: No   OB History    No data available     Review of Systems  Musculoskeletal: Positive for neck pain.  Skin: Positive for rash.  Neurological: Positive for headaches.      Allergies  Penicillins and Methyldopa  Home Medications   Prior to Admission medications   Medication Sig Start Date End Date Taking? Authorizing Provider  doxycycline (VIBRA-TABS) 100 MG tablet Start BID x 10 days then go to 1 tab po daily Patient taking differently: Take 100 mg by mouth 2 (two) times daily.  11/15/14  Yes Amy Cletis Athens, MD  fenofibrate 160 MG tablet Take 1 tablet (160 mg total) by mouth daily. 09/04/13  Yes Amy Cletis Athens, MD  glipiZIDE (GLUCOTROL XL) 10 MG 24 hr tablet Take 1 tablet (10 mg total) by mouth daily with breakfast. Patient taking differently: Take 10 mg by mouth every evening.  05/21/14  Yes Amy  Cletis Athens, MD  glucose blood (ACCU-CHEK SMARTVIEW) test strip Use to check blood sugar one to two times daily.  Dx: 250.00 09/19/13  Yes Amy Cletis Athens, MD  losartan-hydrochlorothiazide (HYZAAR) 50-12.5 MG per tablet TAKE 1 TABLET BY MOUTH DAILY 05/21/14  Yes Amy Cletis Athens, MD  metFORMIN (GLUCOPHAGE) 500 MG tablet Take 1 tablet (500 mg total) by mouth 2 (two) times daily with a meal. 05/21/14  Yes Amy E Bedsole, MD  Multiple Vitamin (MULTIVITAMIN) tablet Take 1 tablet by mouth daily.     Yes Historical Provider, MD  naproxen sodium (ALEVE) 220 MG tablet Take 220 mg by mouth 2 (two) times daily as needed (for pain).   Yes Historical Provider,  MD  venlafaxine (EFFEXOR) 75 MG tablet Take 1 tablet by mouth  daily 10/22/14  Yes Amy E Bedsole, MD   BP 159/102 mmHg  Pulse 90  Temp(Src) 97.6 F (36.4 C) (Oral)  Resp 18  SpO2 100%  LMP 12/03/2014 Physical Exam  Constitutional: She is oriented to person, place, and time. She appears well-developed and well-nourished. No distress.  HENT:  Head: Normocephalic and atraumatic.  Eyes: Pupils are equal, round, and reactive to light. Right eye exhibits no discharge. Left eye exhibits no discharge.  Cardiovascular: Normal rate and regular rhythm.  Exam reveals no gallop and no friction rub.   No murmur heard. Pulmonary/Chest: Effort normal. No respiratory distress. She has no wheezes.  Abdominal: Soft. Bowel sounds are normal. She exhibits no distension. There is no tenderness.  Musculoskeletal: Normal range of motion. She exhibits no edema.  Neurological: She is alert and oriented to person, place, and time.  Skin: Skin is warm and dry.  Psychiatric:  Multiple erythematous lesions noted on legs bilaterally with a few scattered on chest and back, non-tender, excoriated. Single lesion noted on face, tender to palpation in immediate area without radiation to rest of head or neck. No drainage noted. No induration noted.     ED Course  Procedures (including critical care time) Labs Review Labs Reviewed  CBC WITH DIFFERENTIAL/PLATELET  HIV ANTIBODY (ROUTINE TESTING)    Imaging Review No results found.   EKG Interpretation None      MDM   Final diagnoses:  None  Will obtain CBC with diff and HIV. Migraine cocktail containing Benadryl, Compazine, and Toradol given. Discussed with ID over phone who recommended switching from Doxycycline to Keflex so both Staph and Strep species are covered. Already scheduled to follow up with ID next week. CBC normal without signs of acute infection.  Stable for discharge with Keflex. Follow up with ID as scheduled.    [place],  [place] 12/04/14 8299  Leonard Schwartz, MD 12/04/14 [phone]

## 2014-12-04 NOTE — Telephone Encounter (Signed)
Lm on pts vm requesting a call back. Rx called in to pharmacy and added to med list

## 2014-12-04 NOTE — ED Notes (Signed)
Pt. Presents with concern over possible skin infection. Pt. States she was seen Sunday for some facial pain and headaches. Pt. Has spot to L cheekbone. Pt. States she saw ENT this week and was told she did not have sinus infection, pt. With multiple red scabs to arms, legs, and back.

## 2014-12-05 ENCOUNTER — Telehealth: Payer: Self-pay | Admitting: Family Medicine

## 2014-12-05 MED ORDER — CLINDAMYCIN HCL 300 MG PO CAPS: 300.0000 mg | ORAL_CAPSULE | Freq: Three times a day (TID) | ORAL | Status: DC

## 2014-12-05 NOTE — Telephone Encounter (Signed)
Stop keflex. Change to  Clindamycin 300 mg po three times daily x 10 days  Call if #30, 0 RF please ASAP.  Let pt know of change.

## 2014-12-05 NOTE — Telephone Encounter (Signed)
Noted and reviewed ER note.

## 2014-12-05 NOTE — Telephone Encounter (Signed)
Patient Name: Alyssa Warren DOB: 23-Dec-1970 Initial Comment Caller states c/o itching and tingling sensation on head, face and arms, starting taking Keflex yesterday Nurse Assessment Nurse: Marcelline Deist, RN, Lynda Date/Time (Eastern Time): 12/05/2014 12:53:11 PM Confirm and document reason for call. If symptomatic, describe symptoms. ---Caller states c/o itching and tingling sensation on head, face and arms, starting taking Keflex yesterday d/t infection on her face. Was seen in ER yesterday for headaches & face pain/ swelling. Allergic to PCN. Not seeing a rash. No fever. Has an appt. with ID on the 19th. Has the patient traveled out of the country within the last 30 days? ---Not Applicable Does the patient require triage? ---Yes Related visit to physician within the last 2 weeks? ---No Does the PT have any chronic conditions? (i.e. diabetes, asthma, etc.) ---Yes List chronic conditions. ---diabetes, BP rx Did the patient indicate they were pregnant? 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 She began having the itching after taking the 3rd dose of Keflex. Nurse referenced Drugs.com which lists itching as a rare side effect to the drug. Caller is not going to take anymore. Please contact her at this # for further advice on what to do/how to proceed. Referrals REFERRED TO PCP OFFICE Disagree/Comply: Comply

## 2014-12-05 NOTE — Telephone Encounter (Signed)
Spoke to pt and advised. States she has not received culture results back as of yet. States she was seen again at [facility] with severe HA and facial pain. They gave her Keflex and scheduled her with ID. She states that her "face is throbbing."

## 2014-12-05 NOTE — Telephone Encounter (Signed)
Increase in pain  despite change to keflex. Facial pain ongoing for weeks.  No redness, feels flushing and warmth. Swelling in left face above area of past infeciton. She feels like blurry vision on left side in last 24 hours, no double vision.  No fever.  ENT said sinus looked great on CT.  Culture of nose not adequate.. Told to return for repeat culture.   Now with SE to Keflex.  Rash on legs has improved, doing clorox baths.   Recommend appt tommorow.. Double book at 8:15, pt will come in in at 8 AM.  She will start clinda tonight. Will try to culture lesion.  Consider neuralgia... May be benefit from gabapentin etc for pain.

## 2014-12-05 NOTE — Telephone Encounter (Signed)
Pt notified to stop the keflex, and Rx for clindamycin sent to pharmacy.  Pt wants you to know her  Left side facial pain is worsening, it's a throbbing pain only on the left side and it feels hot and flushed, pt said the tramadol not helping at all and vision in left eye had change and pt is very worried  Also pt said the Culture you asked her about needs to be repeated, they advised her it wasn't a good specimen so pt wants to know if she needs to come here to have culture done

## 2014-12-06 ENCOUNTER — Ambulatory Visit (INDEPENDENT_AMBULATORY_CARE_PROVIDER_SITE_OTHER): Payer: 59 | Admitting: Family Medicine

## 2014-12-06 ENCOUNTER — Encounter: Payer: Self-pay | Admitting: Family Medicine

## 2014-12-06 VITALS — BP 124/86 | HR 82 | Temp 98.0°F | Wt 176.2 lb

## 2014-12-06 DIAGNOSIS — R51 Headache: Secondary | ICD-10-CM

## 2014-12-06 DIAGNOSIS — G509 Disorder of trigeminal nerve, unspecified: Secondary | ICD-10-CM

## 2014-12-06 DIAGNOSIS — L739 Follicular disorder, unspecified: Secondary | ICD-10-CM | POA: Diagnosis not present

## 2014-12-06 DIAGNOSIS — R519 Headache, unspecified: Secondary | ICD-10-CM

## 2014-12-06 DIAGNOSIS — L989 Disorder of the skin and subcutaneous tissue, unspecified: Secondary | ICD-10-CM

## 2014-12-06 MED ORDER — GABAPENTIN 100 MG PO CAPS: 100.0000 mg | ORAL_CAPSULE | Freq: Three times a day (TID) | ORAL | Status: DC

## 2014-12-06 NOTE — Progress Notes (Signed)
Pre visit review using our clinic review tool, if applicable. No additional management support is needed unless otherwise documented below in the visit note. 

## 2014-12-06 NOTE — Progress Notes (Signed)
Subjective:    Patient ID: Alyssa Warren, female    DOB: 1970/07/25, 44 y.o.   MRN: [id number]  HPI 44 year old female with recently complicated history of possible infection/abcess with severe facial pain.  She was seen initially on  11/15/2014 with mild  redness at site of previous abscess (1 year prior) , occ clear discharge at site and new erythematous papules, pustules on legs and arms. She was diagnosed at that time with folliculitis and treated with doxy BID x 10 days then  at once daily dose.  She was also told to start bleach baths. Rash on legs improved.  She was referred to derm at that time and they recommended continuing doxy for likely staph infection and gave her bactroban for nasal carrier state irradicaiton.  On 12/01/2014 She began having severe pain  in face, left temple and associated nausea. Went to ED. US showed no abscess present. Treated for  Migraine and sinusitis.  Referred to ENT.   Saw Dr. Tami Ribas.. He had in office sinus CT, per pt negative. Felt likely staph infeciton but he recommended stopping all antibiotics and doing nasal culture for staph. This returned inconclusive.  Pain has continued to grow despite tramadol which has not helped. She returned to ER on 12/04/2014 for left facial pain.  Changed to keflex to cover staph and strep.  Yesterday she had itching from keflex and has had to stop. I called in clindamycin to cover staph and strep.   Since then she has had increase in pain When she was on keflex it did not change pain. Facial pain ongoing now  for weeks. No redness, feels flushing and warmth in left face. Swelling in left face above area of past infeciton. She feels like blurry vision on left side in last 24 hours, no double vision. No fever.  Pain on 8/10 pain scale, difficult to open mouth. Trouble sleeping last night. Points to pain over left maxilla, radiates to anterior ear and down into mandible.  Also reports recent headache in  left forehead.  No current pain in left eye.    Last night took clindamcyin ... SE from keflex now resolved.  Review of Systems  Constitutional: Negative for fever and fatigue.  HENT: Negative for ear pain.   Eyes: Negative for pain.  Respiratory: Negative for chest tightness and shortness of breath.   Cardiovascular: Negative for chest pain, palpitations and leg swelling.  Gastrointestinal: Negative for abdominal pain.  Genitourinary: Negative for dysuria.       Objective:   Physical Exam  Constitutional: Vital signs are normal. She appears well-developed and well-nourished. She is cooperative.  Non-toxic appearance. She does not appear ill. No distress.  HENT:  Head: Normocephalic.  Right Ear: Hearing, tympanic membrane, external ear and ear canal normal. Tympanic membrane is not erythematous, not retracted and not bulging.  Left Ear: Hearing, tympanic membrane, external ear and ear canal normal. Tympanic membrane is not erythematous, not retracted and not bulging.  Nose: No mucosal edema or rhinorrhea. Right sinus exhibits no maxillary sinus tenderness and no frontal sinus tenderness. Left sinus exhibits no maxillary sinus tenderness and no frontal sinus tenderness.  Mouth/Throat: Uvula is midline, oropharynx is clear and moist and mucous membranes are normal.  Eyes: Conjunctivae, EOM and lids are normal. Pupils are equal, round, and reactive to light. Lids are everted and swept, no foreign bodies found.  Neck: Trachea normal and normal range of motion. Neck supple. Carotid bruit is not present.  No thyroid mass and no thyromegaly present.  Cardiovascular: Normal rate, regular rhythm, S1 normal, S2 normal, normal heart sounds, intact distal pulses and normal pulses.  Exam reveals no gallop and no friction rub.   No murmur heard. Pulmonary/Chest: Effort normal and breath sounds normal. No tachypnea. No respiratory distress. She has no decreased breath sounds. She has no wheezes. She has  no rhonchi. She has no rales.  Abdominal: Soft. Normal appearance and bowel sounds are normal. There is no tenderness.  Neurological: She is alert.  Skin: Skin is warm, dry and intact. No rash noted.  Small pore opening at site of previous abscess, no erythema, no discharge, no fluctuance  Psychiatric: Her speech is normal and behavior is normal. Judgment and thought content normal. Her mood appears not anxious. Cognition and memory are normal. She does not exhibit a depressed mood.          Assessment & Plan:   Complicated case.   Folliculitis has resolved with doxy.   She has severe pain in trigeminal nerve distribution on left.., Unclear if due to infection ( no sinus involvement on sinus CT at ENT) or other issue irritating nerve. Wbc nml  On 12/04/2014.  Will continue clindamycin until ID appt upcoming, but if they feel no clear infection or no further imaging warranted, we may want to stop   Sent nasal culture to determine if staph carrier.  Unable to obtain any discharge from left face to culture.  Given left trigeminal nerve pain but not sharp and fast like trigeminal neuralgia... Will treat severe pain with gabapentin 100 mg TID, If not improving refer to neuro. Also no suggestion of shingels but could atypically cause similar pain.

## 2014-12-06 NOTE — Patient Instructions (Signed)
Continue clindamycin three times daily. Stat gabapentin 100 mg at bedtime, then if tolerated can try three times a day.  Hold tramadol if not helping at all. Can use ibuprofen 800 mg every 8 hours for pain as well  Call with update early next week.  If fever or severe pain , go to ER.

## 2014-12-09 ENCOUNTER — Telehealth: Payer: Self-pay

## 2014-12-09 LAB — WOUND CULTURE
Gram Stain: NONE SEEN
Gram Stain: NONE SEEN
ORGANISM ID, BACTERIA: NORMAL

## 2014-12-09 NOTE — Telephone Encounter (Signed)
Pt left v/m; pt was seen 12/06/14; pt said she cannot get appt with infectious disease doctor until Friday. Pt request cb with culture results ASAP. Med for pain is not helping pain and the area on face has swollen more. Pt request different med for pain.pt request cb.[facility].

## 2014-12-09 NOTE — Telephone Encounter (Addendum)
Zuma notified that culture came back normal.  She states she knows something is there.  She sees infectious disease on Friday.  Medication that Dr. Diona Browner gave her for pain is not helping and would like something different sent in. Advised Dr. Diona Browner was out of the office today and would be back tomorrow.   Please advise.

## 2014-12-09 NOTE — Telephone Encounter (Signed)
Pt called back today asking about the status of her message she left. I informed her that the provider was not in the office today, but they would give her a call ASAP when they got the results of the culture. She stated that she is needing the results ASAP because her employer is concerned, not knowing what it is.

## 2014-12-10 MED ORDER — HYDROCODONE-IBUPROFEN 5-200 MG PO TABS: 1.0000 | ORAL_TABLET | Freq: Three times a day (TID) | ORAL | Status: DC | PRN

## 2014-12-10 NOTE — Telephone Encounter (Signed)
Alyssa Warren notified ok not to take the Hydrocodone.  Try to increase Gabapentin as directed earlier today.  I also informed Alyssa Warren that I have left a message with Infectious Disease to see if they could possible see her sooner than Friday.  Still waiting a return call from them.

## 2014-12-10 NOTE — Telephone Encounter (Signed)
Have her increase gabapentin to 2 tab three times daily if not associated with sedation. Will send in hydrocodone for break through pain.  The culture was of her nose, antibiotics may be working.. Continue clindamycin.

## 2014-12-10 NOTE — Telephone Encounter (Signed)
Spoke with Alyssa Warren.  She states she had a new place come up on Friday/Saturday.  It is getting bigger every day.  She states it feels like it is going to explode.  This area was not there when seen on Friday.  She states she is swollen under her eye and not sure she can make it until Friday.  She does not want to take the Hydrocodone-Ibuprofen because she states it knocks.  She is still currently taking the clindamycin.  Please advise.

## 2014-12-10 NOTE — Telephone Encounter (Signed)
Left message at [facility] to call be in regards to trying to get Cathyann in sooner than Friday.  Copy of Sinus CT results requested from [facility].  Culture results faxed to Dr. Jonna Clark) at [phone].

## 2014-12-10 NOTE — Telephone Encounter (Signed)
Okay to not use hydrocodone, tear up rx. Try to increase gabapentin as directed. Get number of ID  MD she is supposed to see Friday, call to see if she can be seen earlier.  Get copy of sinus CT from ENT to see if it discussed face/ superficial tissues at all.  Send copy of nasal culture to ENT.

## 2014-12-13 ENCOUNTER — Ambulatory Visit (INDEPENDENT_AMBULATORY_CARE_PROVIDER_SITE_OTHER): Payer: 59 | Admitting: Infectious Diseases

## 2014-12-13 ENCOUNTER — Encounter: Payer: Self-pay | Admitting: Infectious Diseases

## 2014-12-13 ENCOUNTER — Other Ambulatory Visit (INDEPENDENT_AMBULATORY_CARE_PROVIDER_SITE_OTHER): Payer: 59

## 2014-12-13 VITALS — BP 142/99 | HR 84 | Temp 98.1°F | Wt 177.8 lb

## 2014-12-13 DIAGNOSIS — R51 Headache: Secondary | ICD-10-CM

## 2014-12-13 DIAGNOSIS — E1169 Type 2 diabetes mellitus with other specified complication: Secondary | ICD-10-CM | POA: Insufficient documentation

## 2014-12-13 DIAGNOSIS — E785 Hyperlipidemia, unspecified: Secondary | ICD-10-CM | POA: Diagnosis not present

## 2014-12-13 DIAGNOSIS — G509 Disorder of trigeminal nerve, unspecified: Secondary | ICD-10-CM

## 2014-12-13 DIAGNOSIS — E119 Type 2 diabetes mellitus without complications: Secondary | ICD-10-CM

## 2014-12-13 DIAGNOSIS — E1165 Type 2 diabetes mellitus with hyperglycemia: Secondary | ICD-10-CM

## 2014-12-13 LAB — C-REACTIVE PROTEIN

## 2014-12-13 MED ORDER — VALACYCLOVIR HCL 1 G PO TABS
1000.0000 mg | ORAL_TABLET | Freq: Three times a day (TID) | ORAL | Status: DC
Start: 2014-12-13 — End: 2015-08-14

## 2014-12-13 NOTE — Assessment & Plan Note (Signed)
Encourage diet and exercise.  Will f/u with PCP.

## 2014-12-13 NOTE — Assessment & Plan Note (Signed)
The etiology of this is unclear- could be due to her DM.  Could be post-herpetic neuralgia.  Other wise could consider more atypical etiologies such as SLE, temporal arteritis, sarcoid with nerve involvement.  Will slab screen her for above.  Will see her back in2 weeks.

## 2014-12-13 NOTE — Assessment & Plan Note (Signed)
Encourage her to watch diet, will f/u with PCP.

## 2014-12-13 NOTE — Progress Notes (Signed)
   Subjective:    Patient ID: Alyssa Warren, female    DOB: 1971/03/02, 44 y.o.   MRN: [id number]  HPI 44 yo F with hx of DM2 (06-2013) R facial lesion (11-2013). She developed pain into her neck and was seen in ED (no home therapy).  Was seen in January by her PCP and was put on doxy for 3 months. She noted no change in her "break outs". The skin lesions that she had would not head. She had skin ulcers on her R neck and her L axilla and legs as well. She treated these with peroxide, neosporin without improvement.  She was then told she was colonized with MRSA and started on bleach baths, doxy restarted. She began to have facial pain afterwards. She was also seen by Derm.  She has had worsening facial pain during this time. Some of these were noted to have neck pain as well. She was seen in ED at [facility] and was told she had sinus infection. She was seen by ENT (CT scan -). She had nasal Cx done which was (-). Her anbx were stopped.  She was seen again in ED on 8-10 and was started on keflex which caused to have pruritis. She was then changed to clinda which she has been on since.  She was seen again in ED due to severe, burning pain in her neck and jaw. She has had insomnia.  Today believes her swelling is improved.   PMHx reviewed with pt and in EPIC So[facility], FHx reviewed in EPIC. She relates a hx of SLE in cousins.   Review of Systems  Constitutional: Positive for chills and fatigue. Negative for fever.  HENT: Positive for facial swelling and sinus pressure. Negative for dental problem, mouth sores, rhinorrhea, tinnitus, trouble swallowing and voice change.   Respiratory: Negative for cough and shortness of breath.   Cardiovascular: Negative for chest pain.  Gastrointestinal: Negative for diarrhea and constipation.  Genitourinary: Negative for difficulty urinating and menstrual problem.  Musculoskeletal: Negative for myalgias and arthralgias.   A1C 7 2016 HIV (-) 12-04-14    Objective:    Physical Exam  Constitutional: She appears well-developed and well-nourished.  HENT:  Mouth/Throat: No oropharyngeal exudate.  Eyes: EOM are normal. Pupils are equal, round, and reactive to light.  Neck: Neck supple.  Cardiovascular: Normal rate, regular rhythm and normal heart sounds.   Pulmonary/Chest: Effort normal and breath sounds normal.  Abdominal: Soft. Bowel sounds are normal.  Musculoskeletal: Normal range of motion. She exhibits no edema or tenderness.  Lymphadenopathy:    She has no cervical adenopathy.    She has no axillary adenopathy.       Right: No supraclavicular adenopathy present.       Left: No supraclavicular adenopathy present.  Skin: Skin is warm and dry.             Assessment & Plan:

## 2014-12-14 LAB — SEDIMENTATION RATE: Sed Rate: 5 mm/hr (ref 0–20)

## 2014-12-16 LAB — ANGIOTENSIN CONVERTING ENZYME: Angiotensin-Converting Enzyme: 37 U/L (ref 8–52)

## 2014-12-16 LAB — ANA: Anti Nuclear Antibody(ANA): NEGATIVE

## 2014-12-16 LAB — VARICELLA ZOSTER ANTIBODY, IGG: VARICELLA IGG: 1578 {index} — AB (ref <135.00)

## 2014-12-17 LAB — VARICELLA ZOSTER ANTIBODY, IGM: Varicella Zoster Ab IgM: 0 {ISR} (ref <0.91)

## 2014-12-19 ENCOUNTER — Telehealth: Payer: Self-pay | Admitting: *Deleted

## 2014-12-19 NOTE — Telephone Encounter (Signed)
I dont find her labs to be particularly revealing. She has elevated VZV antibodies consistent with prior chickenpox infection versus vaccination. Labs do not point to a clear-cut explanation for her symptoms. Recommend follow-up per Dr. Algis Downs recommendations

## 2014-12-19 NOTE — Telephone Encounter (Signed)
Patient called and wants her results from labs done 12/13/14. Advised will have the doctor review and someone will give her a call back.

## 2015-01-01 ENCOUNTER — Other Ambulatory Visit: Payer: Self-pay | Admitting: Family Medicine

## 2015-01-01 NOTE — Telephone Encounter (Signed)
Last office visit 12/06/2014.  Last refilled 12/06/2014 for #90 with no refills.  Ok to refill?

## 2015-01-15 ENCOUNTER — Ambulatory Visit (INDEPENDENT_AMBULATORY_CARE_PROVIDER_SITE_OTHER): Payer: 59 | Admitting: Infectious Diseases

## 2015-01-15 ENCOUNTER — Encounter: Payer: Self-pay | Admitting: Infectious Diseases

## 2015-01-15 ENCOUNTER — Other Ambulatory Visit: Payer: Self-pay | Admitting: Family Medicine

## 2015-01-15 VITALS — BP 142/97 | HR 86 | Temp 98.2°F | Ht 66.0 in | Wt 171.0 lb

## 2015-01-15 DIAGNOSIS — G509 Disorder of trigeminal nerve, unspecified: Secondary | ICD-10-CM

## 2015-01-15 DIAGNOSIS — Z23 Encounter for immunization: Secondary | ICD-10-CM

## 2015-01-15 MED ORDER — VALACYCLOVIR HCL 1 G PO TABS: 1000.0000 mg | ORAL_TABLET | Freq: Three times a day (TID) | ORAL | Status: DC

## 2015-01-15 NOTE — Assessment & Plan Note (Signed)
i suspect she had shingles.  She may still have some post-herpetic neuralgia.  Will refill her valtrex for prn.  She can f/u with her PCP with regards to possibly starting TCA or neurontin.

## 2015-01-15 NOTE — Progress Notes (Signed)
   Subjective:    Patient ID: Alyssa Warren, female    DOB: 1970/09/18, 44 y.o.   MRN: [id number]  HPI 44 yo F with hx of DM2 (06-2013) R facial lesion (11-2013). She developed pain into her neck and was seen in ED (no home therapy).  Was seen in January by her PCP and was put on doxy for 3 months. She noted no change in her "break outs". The skin lesions that she had would not head. She had skin ulcers on her R neck and her L axilla and legs as well. She treated these with peroxide, neosporin without improvement.  She was then told she was colonized with MRSA and started on bleach baths, doxy restarted. She began to have facial pain afterwards. She was also seen by Derm.  She has had worsening facial pain during this time. Some of these were noted to have neck pain as well. She was seen in ED at [facility] and was told she had sinus infection. She was seen by ENT (CT scan -). She had nasal Cx done which was (-). Her anbx were stopped.  She was seen again in ED on 8-10 and was started on keflex which caused to have pruritis. She was then changed to clinda which she has been on since.  She was seen again in ED due to severe, burning pain in her neck and jaw. She was seen in ID clinic and started on valtrex and has had improvement in her sx.  Her Varicella IgG was 1578 and her IgM was (-).  She still has some pain in her L maxillary area.  Her energy is improving.   Review of Systems Please see HPI. 12 point ROS o/w (-)     Objective:   Physical Exam  Constitutional: She appears well-developed and well-nourished.  Skin: No rash noted.          Assessment & Plan:

## 2015-01-29 ENCOUNTER — Ambulatory Visit: Payer: 59 | Admitting: Infectious Diseases

## 2015-03-03 ENCOUNTER — Other Ambulatory Visit: Payer: Self-pay | Admitting: Family Medicine

## 2015-03-03 NOTE — Addendum Note (Signed)
Addended by: Carter Kitten on: 03/03/2015 12:34 PM   Modules accepted: Orders

## 2015-03-04 ENCOUNTER — Other Ambulatory Visit: Payer: Self-pay | Admitting: Family Medicine

## 2015-04-07 ENCOUNTER — Other Ambulatory Visit: Payer: Self-pay | Admitting: Family Medicine

## 2015-04-22 ENCOUNTER — Telehealth: Payer: Self-pay | Admitting: Family Medicine

## 2015-04-22 NOTE — Telephone Encounter (Signed)
Please call and schedule CPE with fasting labs with Dr. Bedsole. 

## 2015-05-01 NOTE — Telephone Encounter (Signed)
Labs 4/14 cpx 4/18 Pt aware Please close

## 2015-05-02 ENCOUNTER — Other Ambulatory Visit: Payer: Self-pay

## 2015-05-02 DIAGNOSIS — Z1231 Encounter for screening mammogram for malignant neoplasm of breast: Secondary | ICD-10-CM

## 2015-05-08 ENCOUNTER — Encounter: Payer: Self-pay | Admitting: *Deleted

## 2015-05-28 ENCOUNTER — Ambulatory Visit (INDEPENDENT_AMBULATORY_CARE_PROVIDER_SITE_OTHER): Payer: 59 | Admitting: Family Medicine

## 2015-05-28 ENCOUNTER — Encounter: Payer: Self-pay | Admitting: Family Medicine

## 2015-05-28 VITALS — BP 138/90 | HR 92 | Temp 97.8°F | Wt 175.5 lb

## 2015-05-28 DIAGNOSIS — J028 Acute pharyngitis due to other specified organisms: Principal | ICD-10-CM

## 2015-05-28 DIAGNOSIS — J029 Acute pharyngitis, unspecified: Secondary | ICD-10-CM

## 2015-05-28 DIAGNOSIS — B9789 Other viral agents as the cause of diseases classified elsewhere: Secondary | ICD-10-CM

## 2015-05-28 NOTE — Progress Notes (Signed)
SUBJECTIVE: 45 y.o. female pt of Dr. Diona Browner, new to me, who presents to clinic today with sore throat, myalgias, swollen glands, headache for 1 day. No history of rheumatic fever. Daughter diagnosed with strep throat earlier this week.  Other symptoms: congestion.  Current Outpatient Prescriptions on File Prior to Visit  Medication Sig Dispense Refill  . gabapentin (NEURONTIN) 100 MG capsule TAKE 1 CAPSULE (100 MG TOTAL) BY MOUTH 3 (THREE) TIMES DAILY. 90 capsule 0  . glipiZIDE (GLUCOTROL XL) 10 MG 24 hr tablet TAKE 1 TABLET (10 MG TOTAL) BY MOUTH DAILY WITH BREAKFAST. 90 tablet 0  . glucose blood (ACCU-CHEK SMARTVIEW) test strip Use to check blood sugar one to two times daily.  Dx: 250.00 100 each 5  . hydrocodone-ibuprofen (VICOPROFEN) 5-200 MG per tablet Take 1 tablet by mouth every 8 (eight) hours as needed for pain. 30 tablet 0  . losartan-hydrochlorothiazide (HYZAAR) 50-12.5 MG tablet Take 1 tablet by mouth  daily 90 tablet 0  . metFORMIN (GLUCOPHAGE) 500 MG tablet Take 1 tablet (500 mg total) by mouth 2 (two) times daily with a meal. 180 tablet 3  . Multiple Vitamin (MULTIVITAMIN) tablet Take 1 tablet by mouth daily.      . valACYclovir (VALTREX) 1000 MG tablet Take 1 tablet (1,000 mg total) by mouth 3 (three) times daily. 21 tablet 0  . venlafaxine (EFFEXOR) 75 MG tablet Take 1 tablet by mouth  daily 90 tablet 0   No current facility-administered medications on file prior to visit.    Allergies  Allergen Reactions  . Penicillins Hives and Itching  . Keflex [Cephalexin]   . Methyldopa Other (See Comments)    REACTION: unspecified    Past Medical History  Diagnosis Date  . Allergy   . Hypertension   . Diabetes mellitus without complication [facility])     Past Surgical History  Procedure Laterality Date  . Tubal ligation    . Plantar fasciitis release Bilateral   . Knee surgery Left   . Cyst removed from ovary      Family History  Problem Relation Age of Onset  . Breast  cancer Maternal Aunt     great Aunts   . Breast cancer Paternal Aunt     Social History   Social History  . Marital Status: Married    Spouse Name: N/A  . Number of Children: N/A  . Years of Education: N/A   Occupational History  . Not on file.   Social History Main Topics  . Smoking status: Current Every Day Smoker -- 0.30 packs/day    Types: Cigarettes  . Smokeless tobacco: Never Used  . Alcohol Use: No  . Drug Use: No  . Sexual Activity: Not on file   Other Topics Concern  . Not on file   Social History Narrative   The PMH, PSH, Social History, Family History, Medications, and allergies have been reviewed in [facility], and have been updated if relevant.  OBJECTIVE:   BP 138/90 mmHg  Pulse 92  Temp(Src) 97.8 F (36.6 C) (Oral)  Wt 175 lb 8 oz (79.606 kg)  SpO2 98%  Vitals as noted above. Appears alert, well appearing, and in no distress and oriented to person, place, and time. Ears: bilateral TM's and external ear canals normal Oropharynx: erythematous Neck: supple, no significant adenopathy Lungs: clear to auscultation, no wheezes, rales or rhonchi, symmetric air entry Rapid Strep test is negative  ASSESSMENT: viral pharyngitis  PLAN: Per orders. Gargle, use acetaminophen or other OTC  analgesic.  Call if other family members develop similar symptoms. See prn.

## 2015-05-28 NOTE — Progress Notes (Signed)
Pre visit review using our clinic review tool, if applicable. No additional management support is needed unless otherwise documented below in the visit note. 

## 2015-06-24 ENCOUNTER — Other Ambulatory Visit: Payer: Self-pay | Admitting: Family Medicine

## 2015-07-08 ENCOUNTER — Ambulatory Visit: Payer: 59

## 2015-07-14 ENCOUNTER — Ambulatory Visit: Payer: 59

## 2015-07-14 LAB — HM DIABETES EYE EXAM

## 2015-07-20 ENCOUNTER — Other Ambulatory Visit: Payer: Self-pay | Admitting: Family Medicine

## 2015-07-22 ENCOUNTER — Telehealth: Payer: Self-pay | Admitting: Family Medicine

## 2015-07-22 ENCOUNTER — Encounter: Payer: Self-pay | Admitting: Emergency Medicine

## 2015-07-22 ENCOUNTER — Emergency Department: Payer: 59

## 2015-07-22 ENCOUNTER — Ambulatory Visit: Payer: 59

## 2015-07-22 ENCOUNTER — Ambulatory Visit: Payer: 59 | Admitting: General Surgery

## 2015-07-22 ENCOUNTER — Emergency Department
Admission: EM | Admit: 2015-07-22 | Discharge: 2015-07-22 | Disposition: A | Discharge status: Home / Self Care | Payer: 59 | Attending: Emergency Medicine | Admitting: Emergency Medicine

## 2015-07-22 DIAGNOSIS — K2970 Gastritis, unspecified, without bleeding: Secondary | ICD-10-CM | POA: Diagnosis not present

## 2015-07-22 DIAGNOSIS — I10 Essential (primary) hypertension: Secondary | ICD-10-CM | POA: Diagnosis not present

## 2015-07-22 DIAGNOSIS — E1165 Type 2 diabetes mellitus with hyperglycemia: Secondary | ICD-10-CM | POA: Diagnosis not present

## 2015-07-22 DIAGNOSIS — R1084 Generalized abdominal pain: Secondary | ICD-10-CM | POA: Diagnosis present

## 2015-07-22 DIAGNOSIS — F17210 Nicotine dependence, cigarettes, uncomplicated: Secondary | ICD-10-CM | POA: Insufficient documentation

## 2015-07-22 DIAGNOSIS — F329 Major depressive disorder, single episode, unspecified: Secondary | ICD-10-CM | POA: Diagnosis not present

## 2015-07-22 DIAGNOSIS — E785 Hyperlipidemia, unspecified: Secondary | ICD-10-CM | POA: Diagnosis not present

## 2015-07-22 DIAGNOSIS — Z7984 Long term (current) use of oral hypoglycemic drugs: Secondary | ICD-10-CM | POA: Insufficient documentation

## 2015-07-22 DIAGNOSIS — Z79899 Other long term (current) drug therapy: Secondary | ICD-10-CM | POA: Diagnosis not present

## 2015-07-22 LAB — COMPREHENSIVE METABOLIC PANEL
ALK PHOS: 50 U/L (ref 38–126)
ALT: 26 U/L (ref 14–54)
AST: 31 U/L (ref 15–41)
Albumin: 4.1 g/dL (ref 3.5–5.0)
Anion gap: 7 (ref 5–15)
BUN: 12 mg/dL (ref 6–20)
CALCIUM: 9.2 mg/dL (ref 8.9–10.3)
CO2: 24 mmol/L (ref 22–32)
CREATININE: 0.67 mg/dL (ref 0.44–1.00)
Chloride: 103 mmol/L (ref 101–111)
Glucose, Bld: 122 mg/dL — ABNORMAL HIGH (ref 65–99)
Potassium: 3.6 mmol/L (ref 3.5–5.1)
SODIUM: 134 mmol/L — AB (ref 135–145)
Total Bilirubin: 0.6 mg/dL (ref 0.3–1.2)
Total Protein: 7.5 g/dL (ref 6.5–8.1)

## 2015-07-22 LAB — TROPONIN I

## 2015-07-22 LAB — CBC
HCT: 42.9 % (ref 35.0–47.0)
Hemoglobin: 14.3 g/dL (ref 12.0–16.0)
MCH: 29.7 pg (ref 26.0–34.0)
MCHC: 33.4 g/dL (ref 32.0–36.0)
MCV: 88.8 fL (ref 80.0–100.0)
PLATELETS: 281 10*3/uL (ref 150–440)
RBC: 4.83 MIL/uL (ref 3.80–5.20)
RDW: 14.1 % (ref 11.5–14.5)
WBC: 6.8 10*3/uL (ref 3.6–11.0)

## 2015-07-22 LAB — LIPASE, BLOOD: Lipase: 24 U/L (ref 11–51)

## 2015-07-22 MED ORDER — SUCRALFATE 1 G PO TABS: 1.0000 g | ORAL_TABLET | Freq: Four times a day (QID) | ORAL | Status: DC

## 2015-07-22 MED ORDER — FAMOTIDINE 20 MG PO TABS
40.0000 mg | ORAL_TABLET | Freq: Once | ORAL | Status: AC
  Administered 2015-07-22: 40 mg via ORAL
  Filled 2015-07-22: qty 2

## 2015-07-22 MED ORDER — ONDANSETRON 4 MG PO TBDP: 4.0000 mg | ORAL_TABLET | Freq: Once | ORAL | Status: DC | PRN

## 2015-07-22 MED ORDER — GI COCKTAIL ~~LOC~~
30.0000 mL | ORAL | Status: AC
  Administered 2015-07-22: 30 mL via ORAL
  Filled 2015-07-22: qty 30

## 2015-07-22 MED ORDER — FAMOTIDINE 20 MG PO TABS: 20.0000 mg | ORAL_TABLET | Freq: Two times a day (BID) | ORAL | Status: DC

## 2015-07-22 MED ORDER — ONDANSETRON 4 MG PO TBDP: 4.0000 mg | ORAL_TABLET | Freq: Three times a day (TID) | ORAL | Status: DC | PRN

## 2015-07-22 NOTE — ED Notes (Signed)
Patient transported to Ultrasound, will administer medication once returns.

## 2015-07-22 NOTE — Telephone Encounter (Signed)
°  [facility]  Patient Name: Alyssa Warren  DOB: [birth year]    Initial Comment Caller states [name]; 09/07/1970; about 2 wks ago woke up middle of night w/severe indigestion and vomiting; again last week; about 6 pm again last night; cold sweats, nausea; hurting in middle of upper stomach and to left-seems swollen on left; nausea today; no chest pains;    Nurse Assessment  Nurse: Wynetta Emery, RN, Baker Janus Date/Time (Eastern Time): 07/22/2015 10:14:14 AM  Confirm and document reason for call. If symptomatic, describe symptoms. You must click the next button to save text entered. ---[name] states about 2 wks ago woke up middle of night w/severe indigestion and vomiting it happened again last week; about 6 pm last night started with with cold sweats, nausea; hurting in middle of upper stomach and to left- sides appears swollen on left side with warmth ; nausea today; also has a headache  Has the patient traveled out of the country within the last 30 days? ---No  Does the patient have any new or worsening symptoms? ---Yes  Will a triage be completed? ---Yes  Related visit to physician within the last 2 weeks? ---No  Does the PT have any chronic conditions? (i.e. diabetes, asthma, etc.) ---Unknown  Is the patient pregnant or possibly pregnant? (Ask all females between the ages of 66-55) ---No  Is this a behavioral health or substance abuse call? ---No     Guidelines    Guideline Title Affirmed Question Affirmed Notes  Abdominal Pain - Upper [1] MILD-MODERATE pain AND [2] constant AND [3] present > 2 hours    Final Disposition User   See Physician within 4 Hours (or PCP triage) Wynetta Emery, RN, Baker Janus    Comments  NOTE: Nurse called office to see if we can get her in at all - does not want to go to ED [facility], Nurse notified at office and no availability elsewhere.   Referrals  GO TO FACILITY UNDECIDED    Disagree/Comply: Comply

## 2015-07-22 NOTE — ED Notes (Signed)
Patient presents to the ED with epigastric pain and nausea x 2 weeks but with several acute episodes including one yesterday that have woken her up out of sleep with diaphoresis and pain.  Patient reports history of indigestion.

## 2015-07-22 NOTE — Discharge Instructions (Signed)

## 2015-07-22 NOTE — ED Provider Notes (Signed)
[facility] Emergency Department Provider Note  ____________________________________________  Time seen: 2:20 PM  I have reviewed the triage vital signs and the nursing notes.   HISTORY  Chief Complaint Abdominal Pain    HPI Alyssa Warren is a 45 y.o. female who complains of epigastric pain and nausea for the past 2 weeks but that has been acutely worse since about 6 PM yesterday. It's been constant since then. Not worse with changes in position. Not exertional nor pleuritic. A primarily in the epigastrium and radiating to the left upper quadrant. She has some nausea but no vomiting. She is able to eat some crackers last night and doesn't think that that affected the pain at all but she just didn't feel like eating much. No diarrhea. No fevers or chills. Has a history of "indigestion". Took some Tums which did seem to help.     Past Medical History  Diagnosis Date  . Allergy   . Hypertension   . Diabetes mellitus without complication [facility])      Patient Active Problem List   Diagnosis Date Noted  . Hyperlipidemia associated with type 2 diabetes mellitus ([place]) 12/13/2014  . Trigeminal nerve disorder 12/06/2014  . Folliculitis [id number]  . Skin lesion of face 05/21/2014  . Family history of breast cancer-remote 07/27/2013  . Poorly controlled type 2 diabetes mellitus ([place]) 07/06/2013  . History of breast lump/mass excision 09/26/2012  . Breast screening 09/26/2012  . Major depressive disorder, single episode, mild ([place]) 03/02/2012  . High triglycerides 11/18/2010  . Essential hypertension, benign 11/20/2008  . ALLERGIC RHINITIS 03/06/2007     Past Surgical History  Procedure Laterality Date  . Tubal ligation    . Plantar fasciitis release Bilateral   . Knee surgery Left   . Cyst removed from ovary       Current Outpatient Rx  Name  Route  Sig  Dispense  Refill  . famotidine (PEPCID) 20 MG tablet   Oral   Take 1 tablet (20 mg  total) by mouth 2 (two) times daily.   60 tablet   0   . gabapentin (NEURONTIN) 100 MG capsule      TAKE 1 CAPSULE (100 MG TOTAL) BY MOUTH 3 (THREE) TIMES DAILY.   90 capsule   0   . glipiZIDE (GLUCOTROL XL) 10 MG 24 hr tablet      Take 1 tablet by mouth  daily with breakfast   90 tablet   0   . glucose blood (ACCU-CHEK SMARTVIEW) test strip      Use to check blood sugar one to two times daily.  Dx: 250.00   100 each   5     Accu-chek Nano   . hydrocodone-ibuprofen (VICOPROFEN) 5-200 MG per tablet   Oral   Take 1 tablet by mouth every 8 (eight) hours as needed for pain.   30 tablet   0   . losartan-hydrochlorothiazide (HYZAAR) 50-12.5 MG tablet      Take 1 tablet by mouth  daily   90 tablet   0   . metFORMIN (GLUCOPHAGE) 500 MG tablet      TAKE 1 TABLET BY MOUTH 2  TIMES DAILY WITH A MEAL.   180 tablet   0   . Multiple Vitamin (MULTIVITAMIN) tablet   Oral   Take 1 tablet by mouth daily.           . ondansetron (ZOFRAN ODT) 4 MG disintegrating tablet   Oral   Take  1 tablet (4 mg total) by mouth every 8 (eight) hours as needed for nausea or vomiting.   20 tablet   0   . sucralfate (CARAFATE) 1 g tablet   Oral   Take 1 tablet (1 g total) by mouth 4 (four) times daily.   120 tablet   1   . valACYclovir (VALTREX) 1000 MG tablet   Oral   Take 1 tablet (1,000 mg total) by mouth 3 (three) times daily.   21 tablet   0   . venlafaxine (EFFEXOR) 75 MG tablet      Take 1 tablet by mouth  daily   90 tablet   0      Allergies Penicillins; Keflex; and Methyldopa   Family History  Problem Relation Age of Onset  . Breast cancer Maternal Aunt     great Aunts   . Breast cancer Paternal Aunt     Social History Social History  Substance Use Topics  . Smoking status: Current Every Day Smoker -- 0.30 packs/day    Types: Cigarettes  . Smokeless tobacco: Never Used  . Alcohol Use: No    Review of Systems  Constitutional:   No fever or chills. No  weight changes Eyes:   No vision changes.  ENT:   No sore throat. No rhinorrhea. Cardiovascular:   No chest pain. Respiratory:   No dyspnea or cough. Gastrointestinal:   Abdominal pain with nausea but without vomiting and diarrhea. As above.Marland Kitchen  No BRBPR or melena. Genitourinary:   Negative for dysuria or difficulty urinating. Musculoskeletal:   Negative for focal pain or swelling Skin:   Negative for rash. Neurological:   Negative for headaches, focal weakness or numbness.  10-point ROS otherwise negative.  ____________________________________________   PHYSICAL EXAM:  VITAL SIGNS: ED Triage Vitals  Enc Vitals Group     BP 07/22/15 1303 163/114 mmHg     Pulse Rate 07/22/15 1303 88     Resp 07/22/15 1303 18     Temp 07/22/15 1303 97.5 F (36.4 C)     Temp Source 07/22/15 1303 Oral     SpO2 07/22/15 1303 96 %     Weight 07/22/15 1303 168 lb (76.204 kg)     Height 07/22/15 1303 5\' 6"  (1.676 m)     Head Cir --      Peak Flow --      Pain Score 07/22/15 1306 7     Pain Loc --      Pain Edu? --      Excl. in [place]? --     Vital signs reviewed, nursing assessments reviewed.   Constitutional:   Alert and oriented. Well appearing and in no distress. Eyes:   No scleral icterus. No conjunctival pallor. PERRL. EOMI ENT   Head:   Normocephalic and atraumatic.   Nose:   No congestion/rhinnorhea. No septal hematoma   Mouth/Throat:   MMM, no pharyngeal erythema. No peritonsillar mass.    Neck:   No stridor. No SubQ emphysema. No meningismus. Hematological/Lymphatic/Immunilogical:   No cervical lymphadenopathy. Cardiovascular:   RRR. Symmetric bilateral radial and DP pulses.  No murmurs.  Respiratory:   Normal respiratory effort without tachypnea nor retractions. Breath sounds are clear and equal bilaterally. No wheezes/rales/rhonchi. Gastrointestinal:   Soft with tenderness in the diffuse upper abdomen including the right upper quadrant epigastrium and left upper quadrant.  Most severe in the left upper quadrant. Non distended. There is no CVA tenderness.  No rebound, rigidity, or guarding. Genitourinary:  deferred Musculoskeletal:   Nontender with normal range of motion in all extremities. No joint effusions.  No lower extremity tenderness.  No edema. Neurologic:   Normal speech and language.  CN 2-10 normal. Motor grossly intact. No gross focal neurologic deficits are appreciated.  Skin:    Skin is warm, dry and intact. No rash noted.  No petechiae, purpura, or bullae. Psychiatric:   Mood and affect are normal. ____________________________________________    LABS (pertinent positives/negatives) (all labs ordered are listed, but only abnormal results are displayed) Labs Reviewed  COMPREHENSIVE METABOLIC PANEL - Abnormal; Notable for the following:    Sodium 134 (*)    Glucose, Bld 122 (*)    All other components within normal limits  LIPASE, BLOOD  CBC  TROPONIN I  URINALYSIS COMPLETEWITH MICROSCOPIC ([facility] ONLY)   ____________________________________________   EKG  Interpreted by me  Date: 07/22/2015  Rate: 90  Rhythm: normal sinus rhythm  QRS Axis: normal  Intervals: normal  ST/T Wave abnormalities: normal  Conduction Disutrbances: none  Narrative Interpretation: unremarkable  No indication of right heart strain    ____________________________________________    RADIOLOGY  Ultrasound right upper quadrant shows a unremarkable gallbladder with 6 mmeter polyp  ____________________________________________   PROCEDURES   ____________________________________________   INITIAL IMPRESSION / ASSESSMENT AND PLAN / ED COURSE  Pertinent labs & imaging results that were available during my care of the patient were reviewed by me and considered in my medical decision making (see chart for details).  Patient presents with constant upper abdominal pain acute on chronic for the past 12-18 hours. With her diabetes even though her exam  and history is not very convincing for biliary disease, we'll get an ultrasound of the right upper quadrant. Check labs as well.  ----------------------------------------- 4:37 PM on 07/22/2015 -----------------------------------------  Ultrasound negative. Labs negative. Feels better after GI cocktail and Pepcid. We'll continue her on Carafate and Pepcid and have her follow-up with her primary care doctor in 2 days.Considering the patient's symptoms, medical history, and physical examination today, I have low suspicion for cholecystitis or biliary pathology, pancreatitis, perforation or bowel obstruction, hernia, intra-abdominal abscess, AAA or dissection, volvulus or intussusception, mesenteric ischemia, or appendicitis. Very low suspicion for ACS dissection or PE.       ____________________________________________   FINAL CLINICAL IMPRESSION(S) / ED DIAGNOSES  Final diagnoses:  Gastritis   bilateral upper abdominal pain.    Carrie Mew, MD 07/22/15 [phone]

## 2015-07-22 NOTE — Telephone Encounter (Signed)
I spoke with pt and she is going to [facility] ED now.

## 2015-07-23 ENCOUNTER — Encounter: Payer: Self-pay | Admitting: Family Medicine

## 2015-07-24 ENCOUNTER — Telehealth: Payer: Self-pay | Admitting: Family Medicine

## 2015-07-24 ENCOUNTER — Ambulatory Visit (INDEPENDENT_AMBULATORY_CARE_PROVIDER_SITE_OTHER): Payer: 59 | Admitting: Family Medicine

## 2015-07-24 ENCOUNTER — Ambulatory Visit
Admission: RE | Admit: 2015-07-24 | Discharge: 2015-07-24 | Disposition: A | Discharge status: Home / Self Care | Payer: Commercial Managed Care - HMO | Source: Ambulatory Visit | Attending: Family Medicine | Admitting: Family Medicine

## 2015-07-24 ENCOUNTER — Encounter: Payer: Self-pay | Admitting: Family Medicine

## 2015-07-24 VITALS — BP 150/102 | HR 88 | Temp 98.2°F | Wt 180.2 lb

## 2015-07-24 DIAGNOSIS — R1084 Generalized abdominal pain: Secondary | ICD-10-CM

## 2015-07-24 DIAGNOSIS — R110 Nausea: Secondary | ICD-10-CM

## 2015-07-24 DIAGNOSIS — N888 Other specified noninflammatory disorders of cervix uteri: Secondary | ICD-10-CM | POA: Diagnosis not present

## 2015-07-24 DIAGNOSIS — N83202 Unspecified ovarian cyst, left side: Secondary | ICD-10-CM | POA: Diagnosis not present

## 2015-07-24 DIAGNOSIS — K760 Fatty (change of) liver, not elsewhere classified: Secondary | ICD-10-CM | POA: Diagnosis not present

## 2015-07-24 DIAGNOSIS — R197 Diarrhea, unspecified: Secondary | ICD-10-CM | POA: Diagnosis present

## 2015-07-24 DIAGNOSIS — M5135 Other intervertebral disc degeneration, thoracolumbar region: Secondary | ICD-10-CM | POA: Insufficient documentation

## 2015-07-24 MED ORDER — OMEPRAZOLE 40 MG PO CPDR: 40.0000 mg | DELAYED_RELEASE_CAPSULE | Freq: Every day | ORAL | Status: DC

## 2015-07-24 MED ORDER — IOPAMIDOL (ISOVUE-300) INJECTION 61%
100.0000 mL | Freq: Once | INTRAVENOUS | Status: AC | PRN
  Administered 2015-07-24: 100 mL via INTRAVENOUS

## 2015-07-24 NOTE — Assessment & Plan Note (Signed)
Generalized abdominal pain predominantly upper quadrants described as pressure/bloating sensation. TnI and EKG negative at ER. Anticipate gastritis/PUD, discussed with patient. With new onset diarrhea, check stat CT with contrast to r/o colitis/diverticulitis. Add on omeprazole to carafate/pepcid. Pt agrees with plan.

## 2015-07-24 NOTE — Progress Notes (Signed)
BP 150/102 mmHg  Pulse 88  Temp(Src) 98.2 F (36.8 C) (Oral)  Wt 180 lb 4 oz (81.761 kg)  LMP 07/01/2015 (Approximate)   CC: abd pain x2 wk  Subjective:    Patient ID: Alyssa Warren, female    DOB: 15-Dec-1970, 46 y.o.   MRN: [id number]  HPI: Alyssa Warren is a 45 y.o. female presenting on 07/24/2015 for Abdominal Pain   Seen at ER 07/22/2015, records reviewed. Seen with epigastric pain/nausea for 2 weeks acutely worse over 24 hours. tums helped. EKG WNL, labwork stable including LFTs, lipase, Cr, CBC. CXR WNL. RUQ abd US WNL except for 31mm gallbladder polyp and fatty liver. Felt better after GI cocktail and pepcid. Started on carafate and pepcid.   2 wk h/o epigastric abd pain radiation to LUQ that acutely worsened 4d ago. Feels swollen. Diarrhea started this morning. Constant sharp pain that doesn't let up. Staying very nauseated. Denies inciting factor. Denies urinary symptoms. LMP 07/01/2015, normal.   Denies fevers/chills, blood in stool, cough, chest pain, lower abdominal pain.   Diabetic treated with metformin and glipizide, overdue for A1c. Endorses good cbg's recently.  Current smoker 1/3 ppd.  Relevant past medical, surgical, family and social history reviewed and updated as indicated. Interim medical history since our last visit reviewed. Allergies and medications reviewed and updated. Current Outpatient Prescriptions on File Prior to Visit  Medication Sig  . famotidine (PEPCID) 20 MG tablet Take 1 tablet (20 mg total) by mouth 2 (two) times daily.  Marland Kitchen gabapentin (NEURONTIN) 100 MG capsule TAKE 1 CAPSULE (100 MG TOTAL) BY MOUTH 3 (THREE) TIMES DAILY.  Marland Kitchen glipiZIDE (GLUCOTROL XL) 10 MG 24 hr tablet Take 1 tablet by mouth  daily with breakfast  . glucose blood (ACCU-CHEK SMARTVIEW) test strip Use to check blood sugar one to two times daily.  Dx: 250.00  . losartan-hydrochlorothiazide (HYZAAR) 50-12.5 MG tablet Take 1 tablet by mouth  daily  . metFORMIN  (GLUCOPHAGE) 500 MG tablet TAKE 1 TABLET BY MOUTH 2  TIMES DAILY WITH A MEAL.  . Multiple Vitamin (MULTIVITAMIN) tablet Take 1 tablet by mouth daily.    . ondansetron (ZOFRAN ODT) 4 MG disintegrating tablet Take 1 tablet (4 mg total) by mouth every 8 (eight) hours as needed for nausea or vomiting.  . sucralfate (CARAFATE) 1 g tablet Take 1 tablet (1 g total) by mouth 4 (four) times daily.  Marland Kitchen venlafaxine (EFFEXOR) 75 MG tablet Take 1 tablet by mouth  daily   No current facility-administered medications on file prior to visit.    Review of Systems Per HPI unless specifically indicated in ROS section     Objective:    BP 150/102 mmHg  Pulse 88  Temp(Src) 98.2 F (36.8 C) (Oral)  Wt 180 lb 4 oz (81.761 kg)  LMP 07/01/2015 (Approximate)  Wt Readings from Last 3 Encounters:  07/24/15 180 lb 4 oz (81.761 kg)  07/22/15 168 lb (76.204 kg)  05/28/15 175 lb 8 oz (79.606 kg)    Physical Exam  Constitutional: She appears well-developed and well-nourished. No distress.  HENT:  Mouth/Throat: Oropharynx is clear and moist. No oropharyngeal exudate.  Cardiovascular: Normal rate, regular rhythm, normal heart sounds and intact distal pulses.   No murmur heard. Pulmonary/Chest: Effort normal and breath sounds normal. No respiratory distress. She has no wheezes. She has no rales.  Abdominal: Soft. Bowel sounds are normal. She exhibits no distension and no mass. There is no hepatosplenomegaly. There is generalized tenderness (predominantly epigastric  and bilateral upper quadrants). There is no rigidity, no rebound, no guarding, no CVA tenderness and negative Murphy's sign.  Musculoskeletal: She exhibits no edema.  Skin: Skin is warm and dry. No rash noted.  Psychiatric: She has a normal mood and affect.  Nursing note and vitals reviewed.  Results for orders placed or performed during the hospital encounter of 07/22/15  Lipase, blood  Result Value Ref Range   Lipase 24 11 - 51 U/L  Comprehensive  metabolic panel  Result Value Ref Range   Sodium 134 (L) 135 - 145 mmol/L   Potassium 3.6 3.5 - 5.1 mmol/L   Chloride 103 101 - 111 mmol/L   CO2 24 22 - 32 mmol/L   Glucose, Bld 122 (H) 65 - 99 mg/dL   BUN 12 6 - 20 mg/dL   Creatinine, Ser 0.67 0.44 - 1.00 mg/dL   Calcium 9.2 8.9 - 10.3 mg/dL   Total Protein 7.5 6.5 - 8.1 g/dL   Albumin 4.1 3.5 - 5.0 g/dL   AST 31 15 - 41 U/L   ALT 26 14 - 54 U/L   Alkaline Phosphatase 50 38 - 126 U/L   Total Bilirubin 0.6 0.3 - 1.2 mg/dL   GFR calc non Af Amer >60 >60 mL/min   GFR calc Af Amer >60 >60 mL/min   Anion gap 7 5 - 15  CBC  Result Value Ref Range   WBC 6.8 3.6 - 11.0 K/uL   RBC 4.83 3.80 - 5.20 MIL/uL   Hemoglobin 14.3 12.0 - 16.0 g/dL   HCT 42.9 35.0 - 47.0 %   MCV 88.8 80.0 - 100.0 fL   MCH 29.7 26.0 - 34.0 pg   MCHC 33.4 32.0 - 36.0 g/dL   RDW 14.1 11.5 - 14.5 %   Platelets 281 150 - 440 K/uL  Troponin I  Result Value Ref Range   Troponin I <0.03 <0.031 ng/mL   Lab Results  Component Value Date   HGBA1C 7.0* 05/21/2014       Assessment & Plan:   Problem List Items Addressed This Visit    Generalized abdominal pain - Primary    Generalized abdominal pain predominantly upper quadrants described as pressure/bloating sensation. TnI and EKG negative at ER. Anticipate gastritis/PUD, discussed with patient. With new onset diarrhea, check stat CT with contrast to r/o colitis/diverticulitis. Add on omeprazole to carafate/pepcid. Pt agrees with plan.      Relevant Orders   CT Abdomen Pelvis W Contrast    Other Visit Diagnoses    Nausea without vomiting        Relevant Orders    CT Abdomen Pelvis W Contrast    Diarrhea, unspecified type        Relevant Orders    CT Abdomen Pelvis W Contrast        Follow up plan: Return if symptoms worsen or fail to improve.

## 2015-07-24 NOTE — Progress Notes (Signed)
Pre visit review using our clinic review tool, if applicable. No additional management support is needed unless otherwise documented below in the visit note. 

## 2015-07-24 NOTE — Telephone Encounter (Signed)
appt scheduled with Dr. Darnell Level today

## 2015-07-24 NOTE — Patient Instructions (Signed)
I am suspicious for gastritis/ peptic ulcer disease. Continue carafate, pepcid. Add on omeprazole 40mg  daily. CT scan to rule out diverticulitis. Pass by Marion's office for this. If not improving with treatment let us know.

## 2015-07-24 NOTE — Telephone Encounter (Signed)
Patient Name: Alyssa Warren DOB: 1971/04/09 Initial Comment caller states she has severe left side upper abd pain Nurse Assessment Nurse: Ronnald Ramp, RN, Miranda Date/Time (Eastern Time): 07/24/2015 10:41:57 AM Confirm and document reason for call. If symptomatic, describe symptoms. You must click the next button to save text entered. ---Caller states she has been having upper abdominal pain off and on for 2 weeks. Seen in ED on Tuesday and tests were negative. She had another episode that started at 5am. She is having nausea and left upper abdominal pain. Also with diarrhea. Has the patient traveled out of the country within the last 30 days? ---No Does the patient have any new or worsening symptoms? ---Yes Will a triage be completed? ---Yes Related visit to physician within the last 2 weeks? ---Yes Does the PT have any chronic conditions? (i.e. diabetes, asthma, etc.) ---Yes List chronic conditions. ---Diabetes, HTN, Is the patient pregnant or possibly pregnant? (Ask all females between the ages of 75-55) ---No Is this a behavioral health or substance abuse call? ---No Guidelines Guideline Title Affirmed Question Affirmed Notes Abdominal Pain - Upper [1] MILD-MODERATE pain AND [2] constant AND [3] present > 2 hours Final Disposition User See Physician within 4 Hours (or PCP triage) Ronnald Ramp, RN, Miranda Comments No appt available with PCP. Appt scheduled for 11:30 am with Dr. Danise Mina. Referrals REFERRED TO PCP OFFICE Disagree/Comply: Comply

## 2015-07-28 ENCOUNTER — Ambulatory Visit: Payer: Self-pay | Admitting: General Surgery

## 2015-08-05 ENCOUNTER — Telehealth: Payer: Self-pay | Admitting: Family Medicine

## 2015-08-05 ENCOUNTER — Other Ambulatory Visit (INDEPENDENT_AMBULATORY_CARE_PROVIDER_SITE_OTHER): Payer: 59

## 2015-08-05 DIAGNOSIS — E1169 Type 2 diabetes mellitus with other specified complication: Secondary | ICD-10-CM

## 2015-08-05 DIAGNOSIS — E1165 Type 2 diabetes mellitus with hyperglycemia: Secondary | ICD-10-CM

## 2015-08-05 DIAGNOSIS — E785 Hyperlipidemia, unspecified: Secondary | ICD-10-CM

## 2015-08-05 LAB — COMPREHENSIVE METABOLIC PANEL
ALBUMIN: 4.1 g/dL (ref 3.5–5.2)
ALK PHOS: 56 U/L (ref 39–117)
ALT: 19 U/L (ref 0–35)
AST: 18 U/L (ref 0–37)
BUN: 17 mg/dL (ref 6–23)
CHLORIDE: 100 meq/L (ref 96–112)
CO2: 28 mEq/L (ref 19–32)
Calcium: 9.8 mg/dL (ref 8.4–10.5)
Creatinine, Ser: 0.76 mg/dL (ref 0.40–1.20)
GFR: 87.25 mL/min (ref >60.00)
GLUCOSE: 186 mg/dL — AB (ref 70–99)
POTASSIUM: 4.3 meq/L (ref 3.5–5.1)
SODIUM: 137 meq/L (ref 135–145)
Total Bilirubin: 0.4 mg/dL (ref 0.2–1.2)
Total Protein: 6.8 g/dL (ref 6.0–8.3)

## 2015-08-05 LAB — LIPID PANEL
CHOL/HDL RATIO: 9
CHOLESTEROL: 313 mg/dL — AB (ref 0–200)
HDL: 35.9 mg/dL — AB (ref >39.00)
Triglycerides: 1595 mg/dL — ABNORMAL HIGH (ref 0.0–149.0)

## 2015-08-05 LAB — LDL CHOLESTEROL, DIRECT: Direct LDL: 74 mg/dL

## 2015-08-05 LAB — HEMOGLOBIN A1C: HEMOGLOBIN A1C: 7.5 % — AB (ref 4.6–6.5)

## 2015-08-05 NOTE — Telephone Encounter (Signed)
-----   Message from Marchia Bond sent at 07/29/2015  8:33 AM EDT ----- Regarding: Cpx labs Tues 4/11, need orders. Thanks! :-) Please order  future cpx labs for pt's upcoming lab appt. Thanks Aniceto Boss

## 2015-08-07 ENCOUNTER — Other Ambulatory Visit: Payer: Self-pay | Admitting: Family Medicine

## 2015-08-07 MED ORDER — FENOFIBRATE 160 MG PO TABS: 160.0000 mg | ORAL_TABLET | Freq: Every day | ORAL | Status: DC

## 2015-08-08 ENCOUNTER — Other Ambulatory Visit: Payer: 59

## 2015-08-12 ENCOUNTER — Encounter: Payer: Self-pay | Admitting: Family Medicine

## 2015-08-12 ENCOUNTER — Ambulatory Visit (INDEPENDENT_AMBULATORY_CARE_PROVIDER_SITE_OTHER): Payer: 59 | Admitting: Family Medicine

## 2015-08-12 VITALS — BP 140/90 | HR 93 | Temp 98.7°F | Ht 65.75 in | Wt 182.8 lb

## 2015-08-12 DIAGNOSIS — I10 Essential (primary) hypertension: Secondary | ICD-10-CM | POA: Diagnosis not present

## 2015-08-12 DIAGNOSIS — F320 Major depressive disorder, single episode, mild: Secondary | ICD-10-CM

## 2015-08-12 DIAGNOSIS — E781 Pure hyperglyceridemia: Secondary | ICD-10-CM | POA: Diagnosis not present

## 2015-08-12 DIAGNOSIS — G509 Disorder of trigeminal nerve, unspecified: Secondary | ICD-10-CM

## 2015-08-12 DIAGNOSIS — Z0001 Encounter for general adult medical examination with abnormal findings: Secondary | ICD-10-CM | POA: Diagnosis not present

## 2015-08-12 DIAGNOSIS — E1165 Type 2 diabetes mellitus with hyperglycemia: Secondary | ICD-10-CM | POA: Diagnosis not present

## 2015-08-12 DIAGNOSIS — Z0000 Encounter for general adult medical examination without abnormal findings: Secondary | ICD-10-CM

## 2015-08-12 DIAGNOSIS — L700 Acne vulgaris: Secondary | ICD-10-CM | POA: Insufficient documentation

## 2015-08-12 LAB — HM DIABETES FOOT EXAM

## 2015-08-12 MED ORDER — VENLAFAXINE HCL ER 37.5 MG PO CP24: 37.5000 mg | ORAL_CAPSULE | Freq: Every day | ORAL | Status: DC

## 2015-08-12 NOTE — Progress Notes (Signed)
The patient is here for annual wellness exam and preventative care.   She was seen in ER and by Dr, Darnell Level for  Generalized abdominal pain, pressure, bloating, diarrhea.  Felt GERD. Gastritis.  Neg CT abd/pelvis. Lipase negative.  Trx with pepcid, omeprazole and carafate. She has not been using regularly.   She feels better overall,  Nausea and diarrhea resolved.  Dx with cystic acne on left face.  She has also been seen by Dr. Johnnye Sima ID.Marland Kitchen Felt left facial pain was shingles IgG was very high. In the last  Week she has had intermittent sharp pain in left ear and left jaw She tried gabapentin to use prn.. This helped symptoms. She would like to rechck varicella zoster titers.   Depression, anxiety, PTSD: followed by counselor. On venlafaxine daily and uses alprazolam prn. She would like to wean off if able.  Hypertension: At goal on losartan HCTZ  now that pain is resolved. Using medication without problems or lightheadedness:None  Chest pain with exertion:None  Edema:None  Short of breath:none  BP Readings from Last 3 Encounters:  08/12/15 140/90  07/24/15 150/102  07/22/15 163/114  Average home BPs: has not been checking. Other issues:   Elevated Cholesterol: LDL at goal <100, but trigs very high.. Started on fenofibrate when labs returned. No SE. Lab Results  Component Value Date   CHOL 313* 08/05/2015   HDL 35.90* 08/05/2015   LDLCALC 26 06/29/2013   LDLDIRECT 74.0 08/05/2015   TRIG * 08/05/2015    1595.0 Triglyceride is over 400; calculations on Lipids are invalid.   CHOLHDL 9 08/05/2015  Diet compliance: Moderate  Exercise:Minimal  Other complaints:   Diabetes:  Fluctuating control on metformin and glipizide. Lab Results  Component Value Date   HGBA1C 7.5* 08/05/2015  Using medications without difficulties:No on any Feet problems: no ulcers Blood Sugars averaging:FBS 130, 2 hours after meals 140 eye exam within last year: yes  Wt Readings from Last 3  Encounters:  08/12/15 182 lb 12 oz (82.895 kg)  07/24/15 180 lb 4 oz (81.761 kg)  07/22/15 168 lb (76.204 kg)     Review of Systems  Constitutional: Negative for fever, fatigue and unexpected weight change.  HENT: Negative for ear pain, congestion, sore throat, sneezing, trouble swallowing and sinus pressure.  Eyes: Negative for pain and itching.  Respiratory: Negative for cough, chest tightness, shortness of breath and wheezing.  Cardiovascular: Negative for chest pain, palpitations and leg swelling.  Gastrointestinal: Negative for nausea, abdominal pain, diarrhea, constipation and blood in stool.  Genitourinary: Negative for dysuria, hematuria, vaginal discharge, difficulty urinating and She has noted menometrorrhagia, heavy bleeding with menses, clots Skin: Negative for rash.  Neurological: Negative for syncope, weakness, light-headedness, numbness and headaches.   Objective:   Physical Exam  Constitutional: Vital signs are normal. She appears well-developed and well-nourished. She is cooperative. Non-toxic appearance. She does not appear ill. No distress.  HENT:  Head: Normocephalic.  Right Ear: Hearing, tympanic membrane, external ear and ear canal normal. Tympanic membrane is not erythematous, not retracted and not bulging.  Left Ear: Hearing, tympanic membrane, external ear and ear canal normal. Tympanic membrane is not erythematous, not retracted and not bulging.  Nose: Nose normal. No mucosal edema or rhinorrhea. Right sinus exhibits no maxillary sinus tenderness and no frontal sinus tenderness. Left sinus exhibits no maxillary sinus tenderness and no frontal sinus tenderness.  Mouth/Throat: Uvula is midline, oropharynx is clear and moist and mucous membranes are normal.  Eyes: Conjunctivae normal, EOM  and lids are normal. Pupils are equal, round, and reactive to light. No foreign bodies found.  Neck: Trachea normal and normal range of motion. Neck supple.  Carotid bruit is not present. No mass and no thyromegaly present.  Cardiovascular: Normal rate, regular rhythm, S1 normal, S2 normal, normal heart sounds, intact distal pulses and normal pulses. Exam reveals no gallop and no friction rub.  No murmur heard.  Pulmonary/Chest: Effort normal and breath sounds normal. Not tachypneic. No respiratory distress. She has no decreased breath sounds. She has no wheezes. She has no rhonchi. She has no rales.  Abdominal: Soft. Normal appearance and bowel sounds are normal. She exhibits no distension, no fluid wave, no abdominal bruit and no mass. There is no hepatosplenomegaly. There is no tenderness. There is no rebound, no guarding and no CVA tenderness. No hernia.  Genitourinary: Vagina normal and uterus normal. No breast swelling, tenderness, discharge or bleeding.  There is no rash, tenderness or lesion on the right labia. There is no rash, tenderness or lesion on the left labia. Uterus is not enlarged and not tender. Right adnexum displays no mass, no tenderness and no fullness. Left adnexum displays no mass, no tenderness and no fullness.  Lymphadenopathy:  She has no cervical adenopathy.  She has no axillary adenopathy.  Neurological: She is alert. She has normal strength. No cranial nerve deficit or sensory deficit.  Skin: Skin is warm, dry and intact. No rash noted.  Scarring on left cheek. Psychiatric: Her speech is normal and behavior is normal. Judgment and thought content normal. Her mood appears not anxious. Cognition and memory are normal. She does not exhibit a depressed mood.  Assessment & Plan:   The patient's preventative maintenance and recommended screening tests for an annual wellness exam were reviewed in full today.  Brought up to date unless services declined.  Counselled on the importance of diet, exercise, and its role in overall health and mortality.  The patient's FH and SH was reviewed, including their home life, tobacco  status, and drug and alcohol status.   Vaccine: uptodate with Tdap, PNA Mammo:06/2014, scheduled PAP/DVE: nml pap 2015, has had 3 normals in a row, on q3 year schedule.  Smoker, refused med to help with cessation.      HIV: refused

## 2015-08-12 NOTE — Assessment & Plan Note (Signed)
Improved with decrease in pain. Continue current med.

## 2015-08-12 NOTE — Assessment & Plan Note (Signed)
Thought by Dr. Johnnye Sima to be due to shingles. Given recurrence of pain.. Pt will restart valacyclovir as given by Dr. Johnnye Sima, we will eval with titers and she can use gabapentin prn neuropathic pain.

## 2015-08-12 NOTE — Progress Notes (Signed)
Pre visit review using our clinic review tool, if applicable. No additional management support is needed unless otherwise documented below in the visit note. 

## 2015-08-12 NOTE — Patient Instructions (Addendum)
Return for lab only visit in 2 weeks to recheck the triglycerides. Stop at lab on way out today.  Get back on track with low carb diet, get back to exercise.  Quit smoking.  Decrease venlafaxine to 37.5 mg daily x 20-3 weeks then every other day x 2 weeks then every 2 days then stop.

## 2015-08-12 NOTE — Assessment & Plan Note (Signed)
Improved, followed by derm.

## 2015-08-12 NOTE — Assessment & Plan Note (Signed)
Pt was fasting. Severely elevated, pt now on fenofibrate. Decrease fat and sugar in diet. recheck in 2 weeks.

## 2015-08-12 NOTE — Assessment & Plan Note (Signed)
Likely worsening due to recent infections poor diet control and decrease exercise.  Encouraged exercise, weight loss, healthy eating habits. Recheck A1C in 3 months.

## 2015-08-12 NOTE — Assessment & Plan Note (Signed)
Well controlled, pt wishes to wean off venlafaxine. Reviewed safe method to decrease.

## 2015-08-14 ENCOUNTER — Telehealth: Payer: Self-pay | Admitting: Family Medicine

## 2015-08-14 DIAGNOSIS — G509 Disorder of trigeminal nerve, unspecified: Secondary | ICD-10-CM

## 2015-08-14 LAB — VARICELLA ZOSTER ABS, IGG/IGM: VARICELLA: 1879 {index} (ref >165)

## 2015-08-14 MED ORDER — GABAPENTIN 100 MG PO CAPS
ORAL_CAPSULE | ORAL | Status: DC
Start: 2015-08-14 — End: 2016-12-23

## 2015-08-14 MED ORDER — VALACYCLOVIR HCL 1 G PO TABS
1000.0000 mg | ORAL_TABLET | Freq: Three times a day (TID) | ORAL | Status: DC
Start: 2015-08-14 — End: 2016-02-13

## 2015-08-14 NOTE — Telephone Encounter (Signed)
Prescriptions sent

## 2015-08-14 NOTE — Telephone Encounter (Signed)
-----   Message from Carter Kitten, [place] sent at 08/14/2015  3:05 PM EDT ----- [name] notified as instructed by telephone.  She needs a prescription for valacyclovir sent to [facility] [address].  She states she also needs a refill on her Gabapentin.

## 2015-08-19 ENCOUNTER — Other Ambulatory Visit: Payer: 59

## 2015-08-25 ENCOUNTER — Ambulatory Visit
Admission: RE | Admit: 2015-08-25 | Discharge: 2015-08-25 | Disposition: A | Discharge status: Home / Self Care | Payer: 59 | Source: Ambulatory Visit | Attending: General Surgery | Admitting: General Surgery

## 2015-08-25 ENCOUNTER — Other Ambulatory Visit (INDEPENDENT_AMBULATORY_CARE_PROVIDER_SITE_OTHER): Payer: 59

## 2015-08-25 DIAGNOSIS — Z1231 Encounter for screening mammogram for malignant neoplasm of breast: Secondary | ICD-10-CM | POA: Diagnosis not present

## 2015-08-25 DIAGNOSIS — E781 Pure hyperglyceridemia: Secondary | ICD-10-CM | POA: Diagnosis not present

## 2015-08-25 LAB — TRIGLYCERIDES: Triglycerides: 701 mg/dL — ABNORMAL HIGH (ref 0.0–149.0)

## 2015-08-26 ENCOUNTER — Other Ambulatory Visit: Payer: Self-pay | Admitting: General Surgery

## 2015-08-26 DIAGNOSIS — R928 Other abnormal and inconclusive findings on diagnostic imaging of breast: Secondary | ICD-10-CM

## 2015-08-29 ENCOUNTER — Ambulatory Visit
Admission: RE | Admit: 2015-08-29 | Discharge: 2015-08-29 | Disposition: A | Discharge status: Home / Self Care | Payer: Commercial Managed Care - HMO | Source: Ambulatory Visit | Attending: General Surgery | Admitting: General Surgery

## 2015-08-29 DIAGNOSIS — R928 Other abnormal and inconclusive findings on diagnostic imaging of breast: Secondary | ICD-10-CM

## 2015-10-02 ENCOUNTER — Other Ambulatory Visit: Payer: Self-pay | Admitting: Family Medicine

## 2015-11-02 ENCOUNTER — Other Ambulatory Visit: Payer: Self-pay | Admitting: Family Medicine

## 2015-11-06 ENCOUNTER — Other Ambulatory Visit: Payer: 59

## 2015-11-11 ENCOUNTER — Ambulatory Visit: Payer: 59 | Admitting: Family Medicine

## 2015-11-30 ENCOUNTER — Other Ambulatory Visit: Payer: Self-pay | Admitting: Family Medicine

## 2015-12-02 ENCOUNTER — Other Ambulatory Visit: Payer: Self-pay | Admitting: Family Medicine

## 2015-12-02 ENCOUNTER — Ambulatory Visit: Payer: 59 | Admitting: Family Medicine

## 2015-12-08 ENCOUNTER — Encounter: Payer: Self-pay | Admitting: Family Medicine

## 2015-12-10 MED ORDER — LOSARTAN POTASSIUM-HCTZ 50-12.5 MG PO TABS: 1.0000 | ORAL_TABLET | Freq: Every day | ORAL | 0 refills | Status: DC

## 2016-01-01 ENCOUNTER — Encounter: Payer: Self-pay | Admitting: Family Medicine

## 2016-01-01 MED ORDER — LOSARTAN POTASSIUM-HCTZ 50-12.5 MG PO TABS: 1.0000 | ORAL_TABLET | Freq: Every day | ORAL | 0 refills | Status: DC

## 2016-02-07 ENCOUNTER — Other Ambulatory Visit: Payer: Self-pay | Admitting: Family Medicine

## 2016-02-08 NOTE — Telephone Encounter (Signed)
Last office visit 08/12/2015.   Per AVS patient was to follow up in 3 months.  Was told on 01/01/16 she needed to schedule office visit.  No future appointments scheduled.  Refill?

## 2016-02-08 NOTE — Telephone Encounter (Signed)
Refused refill. Only refill if makes appt on phone when called to notify of rx refusal.

## 2016-02-09 NOTE — Telephone Encounter (Signed)
Spoke with Alyssa Warren.  She states she s not sure why the pharmacy requested refills on her medication.  She does not need refills on her medications at this time.  She did scheduled office visit for 03/02/2016 at 2:45 pm and labs appointment 02/24/2016 at 8:30 am.

## 2016-02-13 ENCOUNTER — Other Ambulatory Visit: Payer: Self-pay

## 2016-02-13 ENCOUNTER — Telehealth: Payer: Self-pay | Admitting: *Deleted

## 2016-02-13 DIAGNOSIS — G509 Disorder of trigeminal nerve, unspecified: Secondary | ICD-10-CM

## 2016-02-13 MED ORDER — VALACYCLOVIR HCL 1 G PO TABS: 1000.0000 mg | ORAL_TABLET | Freq: Three times a day (TID) | ORAL | 0 refills | Status: DC

## 2016-02-13 NOTE — Telephone Encounter (Signed)
If pt thinks she has shingles.. Blistering painful rash.. Near eye. She needs urgent eye MD eval.  I will send in valacyclovir but at acute shingles doses.. So make sure pt reads the label.

## 2016-02-13 NOTE — Telephone Encounter (Signed)
Recurrence of Shingles near left eye while in [place].  Patient's last visit with Dr. Johnnye Sima was 12/2014.  Last refill of Valtrex prescribed by PCP, Dr. Bevelyn Ngo.  RN advised patient to call Dr. Esmeralda Arthur office for refill of rx.  Patient requested a follow-up appt with Dr. Johnnye Sima and this was scheduled.

## 2016-02-13 NOTE — Telephone Encounter (Signed)
Pt left v/m; pt is in [place] until late tonight; pt is 99% sure has shingles over lt eyelid;pain started on 02/11/16. Pt request refill valtrex [facility]. Last annual 08/12/15 and pt has appt with Dr Diona Browner on 03/02/16.Please advise.

## 2016-02-23 ENCOUNTER — Other Ambulatory Visit (INDEPENDENT_AMBULATORY_CARE_PROVIDER_SITE_OTHER): Payer: 59

## 2016-02-23 ENCOUNTER — Telehealth: Payer: Self-pay | Admitting: Family Medicine

## 2016-02-23 DIAGNOSIS — E1165 Type 2 diabetes mellitus with hyperglycemia: Secondary | ICD-10-CM

## 2016-02-23 DIAGNOSIS — E1169 Type 2 diabetes mellitus with other specified complication: Secondary | ICD-10-CM | POA: Diagnosis not present

## 2016-02-23 DIAGNOSIS — E785 Hyperlipidemia, unspecified: Secondary | ICD-10-CM | POA: Diagnosis not present

## 2016-02-23 DIAGNOSIS — E781 Pure hyperglyceridemia: Secondary | ICD-10-CM

## 2016-02-23 LAB — LIPID PANEL
CHOL/HDL RATIO: 7
Cholesterol: 266 mg/dL — ABNORMAL HIGH (ref 0–200)
HDL: 37 mg/dL — ABNORMAL LOW (ref >39.00)
Triglycerides: 1227 mg/dL — ABNORMAL HIGH (ref 0.0–149.0)

## 2016-02-23 LAB — COMPREHENSIVE METABOLIC PANEL
ALK PHOS: 61 U/L (ref 39–117)
ALT: 30 U/L (ref 0–35)
AST: 21 U/L (ref 0–37)
Albumin: 4.3 g/dL (ref 3.5–5.2)
BILIRUBIN TOTAL: 0.6 mg/dL (ref 0.2–1.2)
BUN: 14 mg/dL (ref 6–23)
CO2: 30 meq/L (ref 19–32)
Calcium: 10 mg/dL (ref 8.4–10.5)
Chloride: 101 mEq/L (ref 96–112)
Creatinine, Ser: 0.83 mg/dL (ref 0.40–1.20)
GFR: 78.63 mL/min (ref >60.00)
GLUCOSE: 207 mg/dL — AB (ref 70–99)
Potassium: 4.1 mEq/L (ref 3.5–5.1)
SODIUM: 141 meq/L (ref 135–145)
TOTAL PROTEIN: 7.1 g/dL (ref 6.0–8.3)

## 2016-02-23 LAB — HEMOGLOBIN A1C: Hgb A1c MFr Bld: 8.4 % — ABNORMAL HIGH (ref 4.6–6.5)

## 2016-02-23 LAB — LDL CHOLESTEROL, DIRECT: LDL DIRECT: 70 mg/dL

## 2016-02-23 NOTE — Telephone Encounter (Signed)
-----   Message from Ellamae Sia sent at 02/20/2016 12:50 PM EDT ----- Regarding: Lab orders for Monday, 10.30.17 Lab orders for a f/u appt

## 2016-02-24 ENCOUNTER — Other Ambulatory Visit: Payer: Self-pay | Admitting: Family Medicine

## 2016-02-24 ENCOUNTER — Telehealth: Payer: Self-pay | Admitting: Family Medicine

## 2016-02-24 ENCOUNTER — Other Ambulatory Visit: Payer: 59

## 2016-02-24 DIAGNOSIS — E1165 Type 2 diabetes mellitus with hyperglycemia: Secondary | ICD-10-CM

## 2016-02-24 DIAGNOSIS — E781 Pure hyperglyceridemia: Secondary | ICD-10-CM

## 2016-02-24 NOTE — Telephone Encounter (Signed)
-----   Message from Marchia Bond sent at 02/13/2016  4:21 PM EDT ----- Regarding: Dm f/u labs Mon 10/31, need orders. Thanks! :-) Please order future dm f/u labs for pt's upcoming lab appt. Thanks Aniceto Boss

## 2016-02-29 ENCOUNTER — Other Ambulatory Visit: Payer: Self-pay | Admitting: Family Medicine

## 2016-03-02 ENCOUNTER — Ambulatory Visit (INDEPENDENT_AMBULATORY_CARE_PROVIDER_SITE_OTHER): Payer: 59 | Admitting: Family Medicine

## 2016-03-02 ENCOUNTER — Encounter: Payer: Self-pay | Admitting: Family Medicine

## 2016-03-02 ENCOUNTER — Ambulatory Visit: Payer: 59 | Admitting: Family Medicine

## 2016-03-02 VITALS — BP 126/92 | HR 101 | Temp 98.4°F | Ht 65.75 in | Wt 180.0 lb

## 2016-03-02 DIAGNOSIS — I10 Essential (primary) hypertension: Secondary | ICD-10-CM

## 2016-03-02 DIAGNOSIS — E781 Pure hyperglyceridemia: Secondary | ICD-10-CM

## 2016-03-02 DIAGNOSIS — E1169 Type 2 diabetes mellitus with other specified complication: Secondary | ICD-10-CM | POA: Diagnosis not present

## 2016-03-02 DIAGNOSIS — Z23 Encounter for immunization: Secondary | ICD-10-CM | POA: Diagnosis not present

## 2016-03-02 DIAGNOSIS — E785 Hyperlipidemia, unspecified: Secondary | ICD-10-CM

## 2016-03-02 DIAGNOSIS — E1165 Type 2 diabetes mellitus with hyperglycemia: Secondary | ICD-10-CM

## 2016-03-02 LAB — HM DIABETES FOOT EXAM

## 2016-03-02 MED ORDER — VENLAFAXINE HCL ER 37.5 MG PO CP24: ORAL_CAPSULE | ORAL | 0 refills | Status: DC

## 2016-03-02 MED ORDER — GLIPIZIDE ER 10 MG PO TB24: 10.0000 mg | ORAL_TABLET | Freq: Every day | ORAL | 3 refills | Status: DC

## 2016-03-02 MED ORDER — METFORMIN HCL 1000 MG PO TABS: 500.0000 mg | ORAL_TABLET | Freq: Two times a day (BID) | ORAL | 3 refills | Status: DC

## 2016-03-02 MED ORDER — VENLAFAXINE HCL ER 37.5 MG PO CP24: ORAL_CAPSULE | ORAL | 3 refills | Status: DC

## 2016-03-02 MED ORDER — GLIPIZIDE ER 10 MG PO TB24: 10.0000 mg | ORAL_TABLET | Freq: Every day | ORAL | 0 refills | Status: DC

## 2016-03-02 NOTE — Progress Notes (Signed)
45 year old female presents  For follow up DM.  Hypertension: At goal on losartan HCTZ  Using medication without problems or lightheadedness:None  Chest pain with exertion:None  Edema:None  Short of breath:none  BP Readings from Last 3 Encounters:  03/02/16 (!) 126/92  08/12/15 140/90  07/24/15 (!) 150/102  Average home BPs: has not been checking. Other issues:   Elevated Cholesterol: LDL at goal <100, but trigs very high.. Started on fenofibrate  Was off until last week. Lab Results  Component Value Date   CHOL 266 (H) 02/23/2016   HDL 37.00 (L) 02/23/2016   LDLCALC 26 06/29/2013   LDLDIRECT 70.0 02/23/2016   TRIG 1,227.0 (H) 02/23/2016   CHOLHDL 7 02/23/2016   Diet compliance: Moderate  Exercise:Minimal  Other complaints:   Diabetes: Poor control on metformin and glipizide. Lab Results  Component Value Date   HGBA1C 8.4 (H) 02/23/2016  Using medications without difficulties:No on any Feet problems: no ulcers Blood Sugars averaging:FBS 180-188 eye exam within last year: yes  Wt Readings from Last 3 Encounters:  03/02/16 180 lb (81.6 kg)  08/12/15 182 lb 12 oz (82.9 kg)  07/24/15 180 lb 4 oz (81.8 kg)     Review of Systems  Constitutional: Negative for fever, fatigue and unexpected weight change.  HENT: Negative for ear pain, congestion, sore throat, sneezing, trouble swallowing and sinus pressure.  Eyes: Negative for pain and itching.  Respiratory: Negative for cough, chest tightness, shortness of breath and wheezing.  Cardiovascular: Negative for chest pain, palpitations and leg swelling.  Gastrointestinal: Negative for nausea, abdominal pain, diarrhea, constipation and blood in stool.  Genitourinary: Negative for dysuria, hematuria, vaginal discharge, difficulty urinating and She has noted menometrorrhagia, heavy bleeding with menses, clots Skin: Negative for rash.  Neurological: Negative for syncope, weakness, light-headedness, numbness  and headaches.   Objective:   Physical Exam  Constitutional: Vital signs are normal. She appears well-developed and well-nourished. She is cooperative. Non-toxic appearance. She does not appear ill. No distress.  HENT:  Head: Normocephalic.  Right Ear: Hearing, tympanic membrane, external ear and ear canal normal. Tympanic membrane is not erythematous, not retracted and not bulging.  Left Ear: Hearing, tympanic membrane, external ear and ear canal normal. Tympanic membrane is not erythematous, not retracted and not bulging.  Nose: Nose normal. No mucosal edema or rhinorrhea. Right sinus exhibits no maxillary sinus tenderness and no frontal sinus tenderness. Left sinus exhibits no maxillary sinus tenderness and no frontal sinus tenderness.  Mouth/Throat: Uvula is midline, oropharynx is clear and moist and mucous membranes are normal.  Eyes: Conjunctivae normal, EOM and lids are normal. Pupils are equal, round, and reactive to light. No foreign bodies found.  Neck: Trachea normal and normal range of motion. Neck supple. Carotid bruit is not present. No mass and no thyromegaly present.  Cardiovascular: Normal rate, regular rhythm, S1 normal, S2 normal, normal heart sounds, intact distal pulses and normal pulses. Exam reveals no gallop and no friction rub.  No murmur heard.  Pulmonary/Chest: Effort normal and breath sounds normal. Not tachypneic. No respiratory distress. She has no decreased breath sounds. She has no wheezes. She has no rhonchi. She has no rales.  Abdominal: Soft. Normal appearance and bowel sounds are normal. She exhibits no distension, no fluid wave, no abdominal bruit and no mass. There is no hepatosplenomegaly. There is no tenderness. There is no rebound, no guarding and no CVA tenderness. No hernia.  Genitourinary: Vagina normal and uterus normal. No breast swelling,  tenderness, discharge or bleeding.  There is no rash, tenderness or lesion on the right labia. There  is no rash, tenderness or lesion on the left labia. Uterus is not enlarged and not tender. Right adnexum displays no mass, no tenderness and no fullness. Left adnexum displays no mass, no tenderness and no fullness.  Lymphadenopathy:  She has no cervical adenopathy.  She has no axillary adenopathy.  Neurological: She is alert. She has normal strength. No cranial nerve deficit or sensory deficit.  Skin: Skin is warm, dry and intact. No rash noted.  Scarring on left cheek. Psychiatric: Her speech is normal and behavior is normal. Judgment and thought content normal. Her mood appears not anxious. Cognition and memory are normal. She does not exhibit a depressed mood.   Diabetic foot exam: Normal inspection No skin breakdown No calluses  Normal DP pulses Normal sensation to light touch and monofilament Nails normal

## 2016-03-02 NOTE — Progress Notes (Signed)
Pre visit review using our clinic review tool, if applicable. No additional management support is needed unless otherwise documented below in the visit note. 

## 2016-03-02 NOTE — Assessment & Plan Note (Signed)
LDL at goal but trig high and HDL low.

## 2016-03-02 NOTE — Assessment & Plan Note (Signed)
Well controlled. Continue current medication.  

## 2016-03-02 NOTE — Assessment & Plan Note (Signed)
At risk for paincreatitis. No curren epigastric pain.  She is back on fenofibrate but was not prior to test. Encouraged her to stay on long term. May need to  Add statin if not at goal  At next check.

## 2016-03-02 NOTE — Assessment & Plan Note (Signed)
Poor control. Increase metfomrin to 1000 mg daily. She will look into Victoza. Encouraged exercise, weight loss, healthy eating habits.

## 2016-03-02 NOTE — Patient Instructions (Addendum)
Stay back on fenofibrate daily. Return in 2 weeks for  Fasting triglyceride check. If trig are not at goal.. Add statin medication. Increase metfomrin to  1000 mg twice daily.

## 2016-03-05 ENCOUNTER — Other Ambulatory Visit: Payer: Self-pay | Admitting: Family Medicine

## 2016-03-05 ENCOUNTER — Encounter: Payer: Self-pay | Admitting: Family Medicine

## 2016-03-05 MED ORDER — GLUCOSE BLOOD VI STRP: ORAL_STRIP | 5 refills | Status: DC

## 2016-03-05 MED ORDER — GLIPIZIDE ER 10 MG PO TB24: 10.0000 mg | ORAL_TABLET | Freq: Every day | ORAL | 0 refills | Status: DC

## 2016-03-11 ENCOUNTER — Telehealth: Payer: Self-pay | Admitting: Family Medicine

## 2016-03-11 DIAGNOSIS — E781 Pure hyperglyceridemia: Secondary | ICD-10-CM

## 2016-03-11 NOTE — Telephone Encounter (Signed)
-----   Message from Ellamae Sia sent at 03/10/2016 10:40 AM EST ----- Regarding: Lab orders for Tuesdday, 11.21.17 Lab orders for 2 week f/u

## 2016-03-16 ENCOUNTER — Other Ambulatory Visit: Payer: 59

## 2016-03-22 ENCOUNTER — Ambulatory Visit: Payer: 59 | Admitting: Infectious Diseases

## 2016-03-30 ENCOUNTER — Ambulatory Visit (INDEPENDENT_AMBULATORY_CARE_PROVIDER_SITE_OTHER): Payer: 59 | Admitting: Family Medicine

## 2016-03-30 ENCOUNTER — Encounter: Payer: Self-pay | Admitting: Family Medicine

## 2016-03-30 VITALS — BP 123/94 | HR 92 | Temp 98.3°F | Ht 65.75 in | Wt 175.8 lb

## 2016-03-30 DIAGNOSIS — R109 Unspecified abdominal pain: Secondary | ICD-10-CM | POA: Insufficient documentation

## 2016-03-30 LAB — POC URINALSYSI DIPSTICK (AUTOMATED)
Bilirubin, UA: NEGATIVE
GLUCOSE UA: NEGATIVE
Ketones, UA: NEGATIVE
Leukocytes, UA: NEGATIVE
Nitrite, UA: NEGATIVE
RBC UA: NEGATIVE
Urobilinogen, UA: 0.2
pH, UA: 6

## 2016-03-30 LAB — POCT URINE PREGNANCY: Preg Test, Ur: NEGATIVE

## 2016-03-30 NOTE — Addendum Note (Signed)
Addended by: Carter Kitten on: 03/30/2016 11:02 AM   Modules accepted: Orders

## 2016-03-30 NOTE — Patient Instructions (Signed)
Please stop at the front desk or lab to set up referral or to have labs drawn.  

## 2016-03-30 NOTE — Progress Notes (Signed)
   Subjective:    Patient ID: Alyssa Warren, female    DOB: [birth year], 45 y.o.   MRN: [id number]  HPI  45 year old female presents with new onset left side pain that started yesterday at 4PM. The pain occurred sudden onset, gradually worsened.. Now sharp on scale 8-9/10 on pain scale. Cannot sit and cannot get comfortable. She woke several times last night with pain. Left lateral abd.   She has history of kidney stone.. This does not feel similar to her.  She is mildly nauseous, no fever, no emesis, no D/C. Last BM last night.. No blood.  No dysuria, no increase freq. no urgency, no blood in urine.  She is sexually active.. nml menses.  No recent injury no change in activity.  In 06/2015: severe generalized pain.. CT reviewed.. Nml. Never had colonoscopy.  Review of Systems  Constitutional: Negative for fatigue and fever.  HENT: Negative for ear pain.   Eyes: Negative for pain.  Respiratory: Negative for chest tightness and shortness of breath.   Cardiovascular: Negative for chest pain, palpitations and leg swelling.  Gastrointestinal: Negative for abdominal pain.  Genitourinary: Negative for dysuria.       Objective:   Physical Exam  Constitutional: Vital signs are normal. She appears well-developed and well-nourished. She is cooperative.  Non-toxic appearance. She does not appear ill. No distress.  HENT:  Head: Normocephalic.  Right Ear: Hearing, tympanic membrane, external ear and ear canal normal. Tympanic membrane is not erythematous, not retracted and not bulging.  Left Ear: Hearing, tympanic membrane, external ear and ear canal normal. Tympanic membrane is not erythematous, not retracted and not bulging.  Nose: No mucosal edema or rhinorrhea. Right sinus exhibits no maxillary sinus tenderness and no frontal sinus tenderness. Left sinus exhibits no maxillary sinus tenderness and no frontal sinus tenderness.  Mouth/Throat: Uvula is midline, oropharynx is clear  and moist and mucous membranes are normal.  Eyes: Conjunctivae, EOM and lids are normal. Pupils are equal, round, and reactive to light. Lids are everted and swept, no foreign bodies found.  Neck: Trachea normal and normal range of motion. Neck supple. Carotid bruit is not present. No thyroid mass and no thyromegaly present.  Cardiovascular: Normal rate, regular rhythm, S1 normal, S2 normal, normal heart sounds, intact distal pulses and normal pulses.  Exam reveals no gallop and no friction rub.   No murmur heard. Pulmonary/Chest: Effort normal and breath sounds normal. No tachypnea. No respiratory distress. She has no decreased breath sounds. She has no wheezes. She has no rhonchi. She has no rales.  Abdominal: Soft. Normal appearance and bowel sounds are normal. There is no hepatosplenomegaly. There is tenderness in the left upper quadrant. There is guarding. There is no rigidity, no rebound and no CVA tenderness.    Left lateral pain  Neurological: She is alert.  Skin: Skin is warm, dry and intact. No rash noted.  Psychiatric: Her speech is normal and behavior is normal. Judgment and thought content normal. Her mood appears not anxious. Cognition and memory are normal. She does not exhibit a depressed mood.          Assessment & Plan:

## 2016-03-30 NOTE — Assessment & Plan Note (Signed)
UA clear, no hematuria suggesting renal stone.  No sign of obstruction/ constipation.  Minimal other symptoms.  No Sign of peritonitis on exam.  ? Diverticulitis vs renal stone vs MSK strain.  Given level of pain and how uncomfortable pt is.. We will eval further with CT abd/pelv.

## 2016-03-31 ENCOUNTER — Encounter: Payer: Self-pay | Admitting: Family Medicine

## 2016-03-31 ENCOUNTER — Ambulatory Visit: Payer: 59

## 2016-04-01 ENCOUNTER — Other Ambulatory Visit: Payer: Self-pay | Admitting: Family Medicine

## 2016-04-01 MED ORDER — TAMSULOSIN HCL 0.4 MG PO CAPS: 0.4000 mg | ORAL_CAPSULE | Freq: Every day | ORAL | 0 refills | Status: DC

## 2016-04-01 NOTE — Progress Notes (Signed)
Sent in rx for flomax for likely renal stone.

## 2016-04-04 ENCOUNTER — Emergency Department: Payer: Commercial Managed Care - HMO

## 2016-04-04 ENCOUNTER — Emergency Department
Admission: EM | Admit: 2016-04-04 | Discharge: 2016-04-04 | Disposition: A | Discharge status: Home / Self Care | Payer: Commercial Managed Care - HMO | Attending: Emergency Medicine | Admitting: Emergency Medicine

## 2016-04-04 DIAGNOSIS — Z7984 Long term (current) use of oral hypoglycemic drugs: Secondary | ICD-10-CM | POA: Insufficient documentation

## 2016-04-04 DIAGNOSIS — F17210 Nicotine dependence, cigarettes, uncomplicated: Secondary | ICD-10-CM | POA: Diagnosis not present

## 2016-04-04 DIAGNOSIS — R109 Unspecified abdominal pain: Secondary | ICD-10-CM

## 2016-04-04 DIAGNOSIS — R110 Nausea: Secondary | ICD-10-CM | POA: Diagnosis not present

## 2016-04-04 DIAGNOSIS — E119 Type 2 diabetes mellitus without complications: Secondary | ICD-10-CM | POA: Diagnosis not present

## 2016-04-04 DIAGNOSIS — I10 Essential (primary) hypertension: Secondary | ICD-10-CM | POA: Insufficient documentation

## 2016-04-04 DIAGNOSIS — R1032 Left lower quadrant pain: Secondary | ICD-10-CM | POA: Diagnosis present

## 2016-04-04 DIAGNOSIS — Z79899 Other long term (current) drug therapy: Secondary | ICD-10-CM | POA: Diagnosis not present

## 2016-04-04 LAB — CBC
HCT: 41.4 % (ref 35.0–47.0)
HEMOGLOBIN: 14.5 g/dL (ref 12.0–16.0)
MCH: 32.2 pg (ref 26.0–34.0)
MCHC: 35 g/dL (ref 32.0–36.0)
MCV: 92 fL (ref 80.0–100.0)
PLATELETS: 259 10*3/uL (ref 150–440)
RBC: 4.5 MIL/uL (ref 3.80–5.20)
RDW: 13.8 % (ref 11.5–14.5)
WBC: 8.7 10*3/uL (ref 3.6–11.0)

## 2016-04-04 LAB — POCT PREGNANCY, URINE: Preg Test, Ur: NEGATIVE

## 2016-04-04 LAB — URINALYSIS, COMPLETE (UACMP) WITH MICROSCOPIC
Bilirubin Urine: NEGATIVE
GLUCOSE, UA: 150 mg/dL — AB
HGB URINE DIPSTICK: NEGATIVE
Ketones, ur: 5 mg/dL — AB
NITRITE: NEGATIVE
Protein, ur: 30 mg/dL — AB
Specific Gravity, Urine: 1.027 (ref 1.005–1.030)
pH: 5 (ref 5.0–8.0)

## 2016-04-04 LAB — COMPREHENSIVE METABOLIC PANEL
ALK PHOS: 64 U/L (ref 38–126)
ALT: 29 U/L (ref 14–54)
AST: 24 U/L (ref 15–41)
Albumin: 4.1 g/dL (ref 3.5–5.0)
Anion gap: 8 (ref 5–15)
BUN: 16 mg/dL (ref 6–20)
CALCIUM: 9 mg/dL (ref 8.9–10.3)
CHLORIDE: 105 mmol/L (ref 101–111)
CO2: 23 mmol/L (ref 22–32)
CREATININE: 0.59 mg/dL (ref 0.44–1.00)
GFR calc non Af Amer: >60 mL/min (ref >60)
GLUCOSE: 185 mg/dL — AB (ref 65–99)
Potassium: 3.4 mmol/L — ABNORMAL LOW (ref 3.5–5.1)
SODIUM: 136 mmol/L (ref 135–145)
Total Bilirubin: 0.5 mg/dL (ref 0.3–1.2)
Total Protein: 7 g/dL (ref 6.5–8.1)

## 2016-04-04 LAB — CHLAMYDIA/NGC RT PCR (ARMC ONLY)
CHLAMYDIA TR: NOT DETECTED
N gonorrhoeae: NOT DETECTED

## 2016-04-04 LAB — WET PREP, GENITAL
CLUE CELLS WET PREP: NONE SEEN
SPERM: NONE SEEN
Trich, Wet Prep: NONE SEEN
Yeast Wet Prep HPF POC: NONE SEEN

## 2016-04-04 MED ORDER — SODIUM CHLORIDE 0.9 % IV BOLUS (SEPSIS)
1000.0000 mL | Freq: Once | INTRAVENOUS | Status: AC
  Administered 2016-04-04: 1000 mL via INTRAVENOUS

## 2016-04-04 MED ORDER — MORPHINE SULFATE (PF) 4 MG/ML IV SOLN
4.0000 mg | Freq: Once | INTRAVENOUS | Status: AC
  Administered 2016-04-04: 4 mg via INTRAVENOUS
  Filled 2016-04-04: qty 1

## 2016-04-04 MED ORDER — KETOROLAC TROMETHAMINE 30 MG/ML IJ SOLN
30.0000 mg | Freq: Once | INTRAMUSCULAR | Status: AC
  Administered 2016-04-04: 30 mg via INTRAVENOUS
  Filled 2016-04-04: qty 1

## 2016-04-04 MED ORDER — ONDANSETRON HCL 4 MG/2ML IJ SOLN
4.0000 mg | Freq: Once | INTRAMUSCULAR | Status: AC
  Administered 2016-04-04: 4 mg via INTRAVENOUS
  Filled 2016-04-04: qty 2

## 2016-04-04 NOTE — ED Notes (Signed)
Patient transported to Ultrasound 

## 2016-04-04 NOTE — Discharge Instructions (Addendum)

## 2016-04-04 NOTE — ED Provider Notes (Signed)
[facility] Emergency Department Provider Note   ____________________________________________   First MD Initiated Contact with Patient 04/04/16 [phone]     (approximate)  I have reviewed the triage vital signs and the nursing notes.   HISTORY  Chief Complaint Flank Pain    HPI Alyssa Warren is a 45 y.o. female who comes into the hospital today with left lower quadrant pain. The patient is been having this pain since Monday and her doctor thinks it's a kidney stone. The patient reports that the pain started in her left flank but she does also have a history of PCO as. The patient reports that she has been taking ibuprofen and tramadol for the pain. The patient was also started on Flomax a couple of days ago but she reports that the pain is too intense. The patient reports that she took a tramadol at 10 PM it is not helping. The patient has not noticed any blood in her urine but reports it feels weird when she urinates. She said some nausea with no vomiting and denies any fevers. The patient rates her pain currently an 8 out of 10 in intensity. She's had a kidney stone in the past but reports that this is more intense and more painful. The patient has also had some diarrhea today. She is here for evaluation of these symptoms.   Past Medical History:  Diagnosis Date  . Allergy   . Diabetes mellitus without complication ([place])   . Hypertension     Patient Active Problem List   Diagnosis Date Noted  . Left sided abdominal pain 03/30/2016  . Cystic acne vulgaris 08/12/2015  . Hyperlipidemia associated with type 2 diabetes mellitus ([place]) 12/13/2014  . Trigeminal nerve disorder 12/06/2014  . Family history of breast cancer-remote 07/27/2013  . Poorly controlled type 2 diabetes mellitus ([place]) 07/06/2013  . History of breast lump/mass excision 09/26/2012  . Breast screening 09/26/2012  . Major depressive disorder, single episode, mild ([place]) 03/02/2012  .  High triglycerides 11/18/2010  . Essential hypertension, benign 11/20/2008  . ALLERGIC RHINITIS 03/06/2007    Past Surgical History:  Procedure Laterality Date  . BREAST BIOPSY Right 2013  . cyst removed from ovary    . KNEE SURGERY Left   . plantar fasciitis release Bilateral   . TUBAL LIGATION      Prior to Admission medications   Medication Sig Start Date End Date Taking? Authorizing Provider  famotidine (PEPCID) 20 MG tablet Take 1 tablet (20 mg total) by mouth 2 (two) times daily. 07/22/15   Carrie Mew, MD  fenofibrate 160 MG tablet Take 1 tablet (160 mg total) by mouth daily. 08/07/15   Amy Cletis Athens, MD  gabapentin (NEURONTIN) 100 MG capsule TAKE 1 CAPSULE (100 MG TOTAL) BY MOUTH 3 (THREE) TIMES DAILY. 08/14/15   Amy Cletis Athens, MD  glipiZIDE (GLUCOTROL XL) 10 MG 24 hr tablet Take 1 tablet (10 mg total) by mouth daily with breakfast. 03/05/16   Amy E Diona Browner, MD  glucose blood (ACCU-CHEK SMARTVIEW) test strip Use to check blood sugar one to two times daily.  Dx: E11.9 03/05/16   Amy Cletis Athens, MD  losartan-hydrochlorothiazide (HYZAAR) 50-12.5 MG tablet TAKE 1 TABLET BY MOUTH  DAILY 02/24/16   Amy Cletis Athens, MD  metFORMIN (GLUCOPHAGE) 1000 MG tablet Take 0.5 tablets (500 mg total) by mouth 2 (two) times daily with a meal. 03/02/16   Amy E Diona Browner, MD  Multiple Vitamin (MULTIVITAMIN) tablet Take 1 tablet by  mouth daily.      Historical Provider, MD  omeprazole (PRILOSEC) 40 MG capsule Take 1 capsule (40 mg total) by mouth daily. 07/24/15   Ria Bush, MD  ondansetron (ZOFRAN ODT) 4 MG disintegrating tablet Take 1 tablet (4 mg total) by mouth every 8 (eight) hours as needed for nausea or vomiting. 07/22/15   Carrie Mew, MD  sucralfate (CARAFATE) 1 g tablet Take 1 tablet (1 g total) by mouth 4 (four) times daily. 07/22/15   Carrie Mew, MD  tamsulosin (FLOMAX) 0.4 MG CAPS capsule Take 1 capsule (0.4 mg total) by mouth daily. 04/01/16   Amy Cletis Athens, MD  valACYclovir  (VALTREX) 1000 MG tablet Take 1 tablet (1,000 mg total) by mouth 3 (three) times daily. 02/13/16   Amy Cletis [place], MD  venlafaxine XR (EFFEXOR-XR) 37.5 MG 24 hr capsule TAKE ONE CAPSULE BY MOUTH EVERY MORNING WITH BREAKFAST 03/02/16   Jinny Sanders, MD    Allergies Penicillins; Keflex [cephalexin]; and Methyldopa  Family History  Problem Relation Age of Onset  . Breast cancer Maternal Aunt     great Aunts   . Breast cancer Paternal Aunt     Social History Social History  Substance Use Topics  . Smoking status: Current Every Day Smoker    Packs/day: 0.30    Types: Cigarettes  . Smokeless tobacco: Never Used  . Alcohol use No    Review of Systems Constitutional: No fever/chills Eyes: No visual changes. ENT: No sore throat. Cardiovascular: Denies chest pain. Respiratory: Denies shortness of breath. Gastrointestinal: abdominal pain, nausea, no vomiting.  No diarrhea.  No constipation. Genitourinary: Negative for dysuria. Musculoskeletal: Left flank pain Skin: Negative for rash. Neurological: Negative for headaches, focal weakness or numbness.  10-point ROS otherwise negative.  ____________________________________________   PHYSICAL EXAM:  VITAL SIGNS: ED Triage Vitals  Enc Vitals Group     BP 04/04/16 0432 133/89     Pulse Rate 04/04/16 0432 91     Resp 04/04/16 0432 14     Temp --      Temp src --      SpO2 04/04/16 0432 99 %     Weight 04/04/16 0421 165 lb (74.8 kg)     Height 04/04/16 0421 5\' 6"  (1.676 m)     Head Circumference --      Peak Flow --      Pain Score 04/04/16 0422 8     Pain Loc --      Pain Edu? --      Excl. in [place]? --     Constitutional: Alert and oriented. Well appearing and in no acute distress. Eyes: Conjunctivae are normal. PERRL. EOMI. Head: Atraumatic. Nose: No congestion/rhinnorhea. Mouth/Throat: Mucous membranes are moist.  Oropharynx non-erythematous. Cardiovascular: Normal rate, regular rhythm. Grossly normal heart sounds.   Good peripheral circulation. Respiratory: Normal respiratory effort.  No retractions. Lungs CTAB. Gastrointestinal: Soft with some LLQ abd pain. No distention. Positive bowel sounds Genitourinary: normal external genitalia, L adnexal pain to palpation Musculoskeletal: No lower extremity tenderness nor edema.   Neurologic:  Normal speech and language.  Skin:  Skin is warm, dry and intact.  Psychiatric: Mood and affect are normal.   ____________________________________________   LABS (all labs ordered are listed, but only abnormal results are displayed)  Labs Reviewed  WET PREP, GENITAL - Abnormal; Notable for the following:       Result Value   WBC, Wet Prep HPF POC TOO NUMEROUS TO COUNT (*)  All other components within normal limits  COMPREHENSIVE METABOLIC PANEL - Abnormal; Notable for the following:    Potassium 3.4 (*)    Glucose, Bld 185 (*)    All other components within normal limits  URINALYSIS, COMPLETE (UACMP) WITH MICROSCOPIC - Abnormal; Notable for the following:    Color, Urine YELLOW (*)    APPearance HAZY (*)    Glucose, UA 150 (*)    Ketones, ur 5 (*)    Protein, ur 30 (*)    Leukocytes, UA TRACE (*)    Bacteria, UA RARE (*)    Squamous Epithelial / LPF 6-30 (*)    All other components within normal limits  CHLAMYDIA/NGC RT PCR ([facility] ONLY)  CBC  POC URINE PREG, ED  POCT PREGNANCY, URINE   ____________________________________________  EKG  none ____________________________________________  RADIOLOGY  CT renal stone study ____________________________________________   PROCEDURES  Procedure(s) performed: None  Procedures  Critical Care performed: No  ____________________________________________   INITIAL IMPRESSION / ASSESSMENT AND PLAN / ED COURSE  Pertinent labs & imaging results that were available during my care of the patient were reviewed by me and considered in my medical decision making (see chart for details).  This  is a 45 year old female who comes into the hospital today with some left lower quadrant pain. The patient thinks he has a kidney stone. We will check some blood work to include a urinalysis. I will also send the patient for a CT scan. I will give the patient a dose of Zofran as well as morphine and a liter of normal saline. Once I received the results of the blood work and imaging studies I will reassess the patient.  Clinical Course as of Apr 04 714  Sun Apr 04, 2016  [id number] IMPRESSION: 1. Mild hepatic steatosis 2. Small fat containing umbilical hernia 3. Nonobstructing right nephrolithiasis. No ureteral calculi.   CT Renal Laren Everts [AW]    Clinical Course User Index [AW] Loney Hering, MD   Patient CT scan does not show any left sided obstructing stones. Given the patient's history I will send her for an ultrasound to evaluate for possible cyst causing this pain. The patient receive a dose of Toradol. The patient's care will be signed out to Dr. Jacqualine Code who will reassess and disposition the patient once the results of the CT return.  ____________________________________________   FINAL CLINICAL IMPRESSION(S) / ED DIAGNOSES  Final diagnoses:  Abdominal pain      NEW MEDICATIONS STARTED DURING THIS VISIT:  New Prescriptions   No medications on file     Note:  This document was prepared using Dragon voice recognition software and may include unintentional dictation errors.    Loney Hering, MD 04/04/16 [phone]

## 2016-04-04 NOTE — ED Triage Notes (Signed)
Patient presents with left flank pain since Monday. Believes she has a kidney stone. States kidney stone many years ago but this pain is "100 times worse." Nausea without vomiting.

## 2016-04-04 NOTE — ED Notes (Signed)
Report to janie, rn.  

## 2016-04-04 NOTE — ED Provider Notes (Signed)
Vitals:   04/04/16 0545 04/04/16 0719  BP:  113/78  Pulse: 82 72  Resp:  18  Temp:       Patient resting comfortably. No distress. Repeat abdominal exam, abdomen soft nontender nondistended. Patient reports mild discomfort in the left flank without rebound or guarding. No rash.  US Transvaginal Non-ob  Result Date: 04/04/2016 CLINICAL DATA:  Left flank pain and left lower quadrant pain 6 days. Previous tubal ligation. Urine pregnancy test negative. EXAM: TRANSABDOMINAL AND TRANSVAGINAL ULTRASOUND OF PELVIS DOPPLER ULTRASOUND OF OVARIES TECHNIQUE: Both transabdominal and transvaginal ultrasound examinations of the pelvis were performed. Transabdominal technique was performed for global imaging of the pelvis including uterus, ovaries, adnexal regions, and pelvic cul-de-sac. It was necessary to proceed with endovaginal exam following the transabdominal exam to visualize the endometrium and ovaries. Color and duplex Doppler ultrasound was utilized to evaluate blood flow to the ovaries. COMPARISON:  CT abdomen/ pelvis earlier today. FINDINGS: Uterus Measurements: 5.5 x 6.1 x 7.2 cm. No fibroids or other mass visualized. Multiple nabothian cysts with the largest measuring 1.3 cm. Endometrium Thickness: 10.2 mm.  No focal abnormality visualized. Right ovary Measurements: 1.6 x 2.2 x 2.9 cm. Normal appearance/no adnexal mass. Left ovary Measurements: 2.3 x 1.7 x 3.7 cm. Normal appearance/no adnexal mass. Pulsed Doppler evaluation of both ovaries demonstrates normal low-resistance arterial and venous waveforms. Other findings Trace free pelvic fluid. IMPRESSION: Uterus and ovaries within normal. Electronically Signed   By: Marin Olp M.D.   On: 04/04/2016 07:33   US Pelvis Complete  Result Date: 04/04/2016 CLINICAL DATA:  Left flank pain and left lower quadrant pain 6 days. Previous tubal ligation. Urine pregnancy test negative. EXAM: TRANSABDOMINAL AND TRANSVAGINAL ULTRASOUND OF PELVIS DOPPLER  ULTRASOUND OF OVARIES TECHNIQUE: Both transabdominal and transvaginal ultrasound examinations of the pelvis were performed. Transabdominal technique was performed for global imaging of the pelvis including uterus, ovaries, adnexal regions, and pelvic cul-de-sac. It was necessary to proceed with endovaginal exam following the transabdominal exam to visualize the endometrium and ovaries. Color and duplex Doppler ultrasound was utilized to evaluate blood flow to the ovaries. COMPARISON:  CT abdomen/ pelvis earlier today. FINDINGS: Uterus Measurements: 5.5 x 6.1 x 7.2 cm. No fibroids or other mass visualized. Multiple nabothian cysts with the largest measuring 1.3 cm. Endometrium Thickness: 10.2 mm.  No focal abnormality visualized. Right ovary Measurements: 1.6 x 2.2 x 2.9 cm. Normal appearance/no adnexal mass. Left ovary Measurements: 2.3 x 1.7 x 3.7 cm. Normal appearance/no adnexal mass. Pulsed Doppler evaluation of both ovaries demonstrates normal low-resistance arterial and venous waveforms. Other findings Trace free pelvic fluid. IMPRESSION: Uterus and ovaries within normal. Electronically Signed   By: Marin Olp M.D.   On: 04/04/2016 07:33   [place] Art/ven Flow Abd Pelv Doppler  Result Date: 04/04/2016 CLINICAL DATA:  Left flank pain and left lower quadrant pain 6 days. Previous tubal ligation. Urine pregnancy test negative. EXAM: TRANSABDOMINAL AND TRANSVAGINAL ULTRASOUND OF PELVIS DOPPLER ULTRASOUND OF OVARIES TECHNIQUE: Both transabdominal and transvaginal ultrasound examinations of the pelvis were performed. Transabdominal technique was performed for global imaging of the pelvis including uterus, ovaries, adnexal regions, and pelvic cul-de-sac. It was necessary to proceed with endovaginal exam following the transabdominal exam to visualize the endometrium and ovaries. Color and duplex Doppler ultrasound was utilized to evaluate blood flow to the ovaries. COMPARISON:  CT abdomen/ pelvis earlier today.  FINDINGS: Uterus Measurements: 5.5 x 6.1 x 7.2 cm. No fibroids or other mass visualized. Multiple nabothian cysts with the  largest measuring 1.3 cm. Endometrium Thickness: 10.2 mm.  No focal abnormality visualized. Right ovary Measurements: 1.6 x 2.2 x 2.9 cm. Normal appearance/no adnexal mass. Left ovary Measurements: 2.3 x 1.7 x 3.7 cm. Normal appearance/no adnexal mass. Pulsed Doppler evaluation of both ovaries demonstrates normal low-resistance arterial and venous waveforms. Other findings Trace free pelvic fluid. IMPRESSION: Uterus and ovaries within normal. Electronically Signed   By: Marin Olp M.D.   On: 04/04/2016 07:33   Ct Renal Stone Study  Result Date: 04/04/2016 CLINICAL DATA:  Left flank pain for 6 days. EXAM: CT ABDOMEN AND PELVIS WITHOUT CONTRAST TECHNIQUE: Multidetector CT imaging of the abdomen and pelvis was performed following the standard protocol without IV contrast. COMPARISON:  07/24/2015 FINDINGS: Lower chest: No acute abnormality. Hepatobiliary: Mild hepatic steatosis without significant focal lesion. Gallbladder and bile ducts appear unremarkable. Pancreas: Unremarkable. No pancreatic ductal dilatation or surrounding inflammatory changes. Spleen: Normal in size without focal abnormality. Adrenals/Urinary Tract: Both adrenals are normal. There are several 2 mm calculi in the right renal collecting system, nonobstructing. No ureteral calculi. No significant renal parenchymal lesions. Urinary bladder is unremarkable. Stomach/Bowel: Stomach is within normal limits. Appendix appears normal. No evidence of bowel wall thickening, distention, or inflammatory changes. Vascular/Lymphatic: The abdominal aorta is normal in caliber with mild atherosclerotic calcification. No adenopathy in the abdomen or pelvis. Reproductive: Uterus and bilateral adnexa are unremarkable. Other: No acute inflammatory changes in the abdomen or pelvis. No ascites. Small fat containing umbilical hernia.  Musculoskeletal: No significant skeletal lesions. IMPRESSION: 1. Mild hepatic steatosis 2. Small fat containing umbilical hernia 3. Nonobstructing right nephrolithiasis.  No ureteral calculi. Electronically Signed   By: Andreas Newport M.D.   On: 04/04/2016 05:32   Reviewed CT findings with patient, also ultrasound. Discussed with her and she will follow up closely with primary. Careful return precautions discussed. Patient's friend/family driving her on home.  Return precautions and treatment recommendations and follow-up discussed with the patient who is agreeable with the plan.    Delman Kitten, MD 04/04/16 [phone]

## 2016-04-05 ENCOUNTER — Encounter: Payer: Self-pay | Admitting: Family Medicine

## 2016-04-14 ENCOUNTER — Other Ambulatory Visit: Payer: Self-pay | Admitting: Family Medicine

## 2016-05-09 ENCOUNTER — Other Ambulatory Visit: Payer: Self-pay | Admitting: Family Medicine

## 2016-05-09 NOTE — Telephone Encounter (Signed)
Last office visit 03/30/16.  Last filled 04/01/16 for #15 with no refills for possible kidney stone.  Refill?

## 2016-05-11 NOTE — Telephone Encounter (Signed)
Call pt.. Se does not need to take this long term.. Does she think she has note passed the stone from 03/2016. Or passing a new one?

## 2016-05-11 NOTE — Telephone Encounter (Signed)
Left message for Jolleen to return my call.

## 2016-05-11 NOTE — Telephone Encounter (Signed)
Spoke with Alyssa Warren.  She states she did not request this refill.  [facility] must have sent it for automatic refill.   Refill denied.

## 2016-05-19 ENCOUNTER — Telehealth: Payer: Self-pay | Admitting: Family Medicine

## 2016-05-19 DIAGNOSIS — E1165 Type 2 diabetes mellitus with hyperglycemia: Secondary | ICD-10-CM

## 2016-05-19 NOTE — Telephone Encounter (Signed)
-----   Message from Ellamae Sia sent at 05/18/2016  4:12 PM EST ----- Regarding: lab orders for Friday,2.2.18 Lab orders for a f/u DM appt

## 2016-05-21 ENCOUNTER — Other Ambulatory Visit: Payer: Self-pay | Admitting: *Deleted

## 2016-05-21 MED ORDER — METFORMIN HCL 1000 MG PO TABS: 500.0000 mg | ORAL_TABLET | Freq: Two times a day (BID) | ORAL | 3 refills | Status: DC

## 2016-05-21 MED ORDER — GLIPIZIDE ER 10 MG PO TB24: 10.0000 mg | ORAL_TABLET | Freq: Every day | ORAL | 3 refills | Status: DC

## 2016-05-21 MED ORDER — LOSARTAN POTASSIUM-HCTZ 50-12.5 MG PO TABS: 1.0000 | ORAL_TABLET | Freq: Every day | ORAL | 3 refills | Status: DC

## 2016-05-21 MED ORDER — VENLAFAXINE HCL ER 37.5 MG PO CP24: ORAL_CAPSULE | ORAL | 3 refills | Status: DC

## 2016-05-21 NOTE — Addendum Note (Signed)
Addended by: Carter Kitten on: 05/21/2016 10:26 AM   Modules accepted: Orders

## 2016-05-28 ENCOUNTER — Other Ambulatory Visit: Payer: 59

## 2016-06-04 ENCOUNTER — Ambulatory Visit: Payer: 59 | Admitting: Family Medicine

## 2016-06-08 LAB — HM DIABETES EYE EXAM

## 2016-06-14 ENCOUNTER — Other Ambulatory Visit: Payer: Self-pay

## 2016-06-14 DIAGNOSIS — Z1231 Encounter for screening mammogram for malignant neoplasm of breast: Secondary | ICD-10-CM

## 2016-06-29 ENCOUNTER — Other Ambulatory Visit: Payer: Self-pay

## 2016-06-29 DIAGNOSIS — Z1231 Encounter for screening mammogram for malignant neoplasm of breast: Secondary | ICD-10-CM

## 2016-06-30 ENCOUNTER — Encounter: Payer: Self-pay | Admitting: Family Medicine

## 2016-07-06 ENCOUNTER — Encounter: Payer: Self-pay | Admitting: Family Medicine

## 2016-07-06 ENCOUNTER — Ambulatory Visit (INDEPENDENT_AMBULATORY_CARE_PROVIDER_SITE_OTHER): Payer: 59 | Admitting: Family Medicine

## 2016-07-06 DIAGNOSIS — M5412 Radiculopathy, cervical region: Secondary | ICD-10-CM | POA: Diagnosis not present

## 2016-07-06 DIAGNOSIS — J111 Influenza due to unidentified influenza virus with other respiratory manifestations: Secondary | ICD-10-CM

## 2016-07-06 MED ORDER — CYCLOBENZAPRINE HCL 10 MG PO TABS: 5.0000 mg | ORAL_TABLET | Freq: Every evening | ORAL | 0 refills | Status: DC | PRN

## 2016-07-06 NOTE — Patient Instructions (Addendum)
Push fluids given mild dehydration..  Rest.  Cough suppressant over the counter.. mucinex DM  Call if any shortness of breath increase and if it is severe go to ER.  For cervical radiculopathy: Continue PT. Trial of mucle relaxant and ibuprofen 800 mg every three hours for pain and inflammation. Call if pain is not improving in 2 weeks for further imaging or med change.

## 2016-07-06 NOTE — Assessment & Plan Note (Signed)
Discussed symptomatic care.  Hydration, rest. Call if SOB, cough worsening or prolongued fever. Reviewed flu timeline. She has no health problems that put her at risk for complications , so we decided against use of tamiflu. Pt agreed. Discussed family prophylaxis, her children will call pediatrition to consider tamiflu prophylaxis. She was advised to not return to work until 24 hour after fever resolved on no antipyretics.  ( Given no fever plan return on 3/15)

## 2016-07-06 NOTE — Assessment & Plan Note (Addendum)
Continue PT, muscle relaxant and increase ibuprofen to 800 mg TID. If not improving.. Consider Iris Pert and prednisone taper with cervical collar.

## 2016-07-06 NOTE — Progress Notes (Signed)
Subjective:    Patient ID: Alyssa Warren, female    DOB: March 06, 1971, 46 y.o.   MRN: [id number]  Cough  This is a new problem. The current episode started in the past 7 days ( 5 days). The problem has been gradually worsening (feels worse today). The problem occurs constantly. The cough is productive of purulent sputum ( darker brownish this AM.). Associated symptoms include myalgias, nasal congestion, a sore throat and shortness of breath. Pertinent negatives include no fever, hemoptysis or wheezing. Associated symptoms comments: Chest wall pain. Exacerbated by:  cough is not keeping her up at night. Risk factors for lung disease include smoking/tobacco exposure. She has tried OTC cough suppressant for the symptoms. The treatment provided significant relief. There is no history of asthma, bronchiectasis, COPD, emphysema, environmental allergies or pneumonia.    Daughter with flu.  In last few weeks she has right shoulder,arm pain (trapezius) radiating down right arm. Now with numbness ( in pinky, ring and middle fingrers in last week. No weakness  PT has not helped much, started dry needing. Therapist has noted muscle spasm in trap. Traction has helped some.  Has had similar in past, no past imaging or procedures. Ibuprofen 200 mg and 1 at night.    Blood pressure 130/86, pulse (!) 106, temperature 98.6 F (37 C), temperature source Oral, height 5' 5.75" (1.67 m), weight 177 lb 8 oz (80.5 kg), last menstrual period 06/24/2016.    Review of Systems  Constitutional: Negative for fever.  HENT: Positive for sore throat.   Respiratory: Positive for cough and shortness of breath. Negative for hemoptysis and wheezing.   Musculoskeletal: Positive for myalgias.  Allergic/Immunologic: Negative for environmental allergies.       Objective:   Physical Exam  Constitutional: Vital signs are normal. She appears well-developed and well-nourished. She is cooperative.  Non-toxic appearance.  She does not appear ill. No distress.  HENT:  Head: Normocephalic.  Right Ear: Hearing, tympanic membrane, external ear and ear canal normal. Tympanic membrane is not erythematous, not retracted and not bulging.  Left Ear: Hearing, tympanic membrane, external ear and ear canal normal. Tympanic membrane is not erythematous, not retracted and not bulging.  Nose: Mucosal edema and rhinorrhea present. Right sinus exhibits no maxillary sinus tenderness and no frontal sinus tenderness. Left sinus exhibits no maxillary sinus tenderness and no frontal sinus tenderness.  Mouth/Throat: Uvula is midline, oropharynx is clear and moist and mucous membranes are normal.  Eyes: Conjunctivae, EOM and lids are normal. Pupils are equal, round, and reactive to light. Lids are everted and swept, no foreign bodies found.  Neck: Trachea normal and normal range of motion. Neck supple. Carotid bruit is not present. No thyroid mass and no thyromegaly present.  Cardiovascular: Normal rate, regular rhythm, S1 normal, S2 normal, normal heart sounds, intact distal pulses and normal pulses.  Exam reveals no gallop and no friction rub.   No murmur heard. Pulmonary/Chest: Effort normal and breath sounds normal. No tachypnea. No respiratory distress. She has no decreased breath sounds. She has no wheezes. She has no rhonchi. She has no rales.  Musculoskeletal:       Cervical back: She exhibits decreased range of motion and tenderness. She exhibits no bony tenderness.  Positive spurling on right  Neurological: She is alert. She has normal strength. She displays no atrophy. No cranial nerve deficit or sensory deficit. She exhibits normal muscle tone.  Skin: Skin is warm, dry and intact. No rash noted.  Psychiatric: Her  speech is normal and behavior is normal. Judgment normal. Her mood appears not anxious. Cognition and memory are normal. She does not exhibit a depressed mood.          Assessment & Plan:

## 2016-07-06 NOTE — Progress Notes (Signed)
Pre visit review using our clinic review tool, if applicable. No additional management support is needed unless otherwise documented below in the visit note. 

## 2016-07-07 ENCOUNTER — Encounter: Payer: Self-pay | Admitting: Emergency Medicine

## 2016-07-07 DIAGNOSIS — Z7984 Long term (current) use of oral hypoglycemic drugs: Secondary | ICD-10-CM | POA: Insufficient documentation

## 2016-07-07 DIAGNOSIS — E119 Type 2 diabetes mellitus without complications: Secondary | ICD-10-CM | POA: Diagnosis not present

## 2016-07-07 DIAGNOSIS — H9201 Otalgia, right ear: Secondary | ICD-10-CM | POA: Diagnosis present

## 2016-07-07 DIAGNOSIS — F17210 Nicotine dependence, cigarettes, uncomplicated: Secondary | ICD-10-CM | POA: Insufficient documentation

## 2016-07-07 DIAGNOSIS — Z79899 Other long term (current) drug therapy: Secondary | ICD-10-CM | POA: Insufficient documentation

## 2016-07-07 DIAGNOSIS — H6691 Otitis media, unspecified, right ear: Secondary | ICD-10-CM | POA: Insufficient documentation

## 2016-07-07 DIAGNOSIS — I10 Essential (primary) hypertension: Secondary | ICD-10-CM | POA: Diagnosis not present

## 2016-07-07 DIAGNOSIS — H7291 Unspecified perforation of tympanic membrane, right ear: Secondary | ICD-10-CM | POA: Insufficient documentation

## 2016-07-07 NOTE — ED Triage Notes (Signed)
Pt presents to ED with right ear pain and drainage. Pt states she has had the flu and has been feeling badly since over the weekend. Ear pain started today. "low grade fever" at home.

## 2016-07-08 ENCOUNTER — Emergency Department
Admission: EM | Admit: 2016-07-08 | Discharge: 2016-07-08 | Disposition: A | Discharge status: Home / Self Care | Payer: 59 | Attending: Emergency Medicine | Admitting: Emergency Medicine

## 2016-07-08 DIAGNOSIS — H6690 Otitis media, unspecified, unspecified ear: Secondary | ICD-10-CM

## 2016-07-08 DIAGNOSIS — H7291 Unspecified perforation of tympanic membrane, right ear: Secondary | ICD-10-CM

## 2016-07-08 MED ORDER — HYDROCODONE-ACETAMINOPHEN 5-325 MG PO TABS: 1.0000 | ORAL_TABLET | ORAL | 0 refills | Status: DC | PRN

## 2016-07-08 MED ORDER — AZITHROMYCIN 250 MG PO TABS: ORAL_TABLET | ORAL | 0 refills | Status: AC

## 2016-07-08 MED ORDER — MECLIZINE HCL 25 MG PO TABS
25.0000 mg | ORAL_TABLET | Freq: Once | ORAL | Status: AC
  Administered 2016-07-08: 25 mg via ORAL

## 2016-07-08 MED ORDER — AZITHROMYCIN 500 MG PO TABS
500.0000 mg | ORAL_TABLET | Freq: Once | ORAL | Status: AC
  Administered 2016-07-08: 500 mg via ORAL
  Filled 2016-07-08: qty 1

## 2016-07-08 MED ORDER — LIDOCAINE HCL 2 % IJ SOLN
5.0000 mL | Freq: Once | INTRAMUSCULAR | Status: AC
  Administered 2016-07-08: 100 mg
  Filled 2016-07-08 (×2): qty 10

## 2016-07-08 MED ORDER — KETOROLAC TROMETHAMINE 30 MG/ML IJ SOLN
15.0000 mg | Freq: Once | INTRAMUSCULAR | Status: AC
  Administered 2016-07-08: 15 mg via INTRAVENOUS
  Filled 2016-07-08: qty 1

## 2016-07-08 MED ORDER — SODIUM CHLORIDE 0.9 % IV BOLUS (SEPSIS)
1000.0000 mL | Freq: Once | INTRAVENOUS | Status: AC
  Administered 2016-07-08: 1000 mL via INTRAVENOUS

## 2016-07-08 NOTE — ED Notes (Signed)
48mL Lidocaine pulled and left at bedside for MD to administer, MD informed.

## 2016-07-08 NOTE — ED Notes (Signed)
20mL more Xylocaine applied to the right eardrum.

## 2016-07-08 NOTE — ED Provider Notes (Signed)
[facility] Emergency Department Provider Note  ____________________________________________   First MD Initiated Contact with Patient 07/08/16 0210     (approximate)  I have reviewed the triage vital signs and the nursing notes.   HISTORY  Chief Complaint Otalgia    HPI Alyssa Warren is a 46 y.o. female who comes to the emergency department with 1 day of severe right ear pain. She's been sick for the past several days with a low-grade fever and was diagnosed clinically with influenza by her primary care physician yesterday. Today around 5 PM she noted fullness and aching in her right ear and shortly thereafter noted discharge or worsening pain. She feels dehydrated.   Past Medical History:  Diagnosis Date  . Allergy   . Diabetes mellitus without complication ([place])   . Hypertension     Patient Active Problem List   Diagnosis Date Noted  . Influenza 07/06/2016  . Cervical radiculopathy 07/06/2016  . Left sided abdominal pain 03/30/2016  . Cystic acne vulgaris 08/12/2015  . Hyperlipidemia associated with type 2 diabetes mellitus ([place]) 12/13/2014  . Trigeminal nerve disorder 12/06/2014  . Family history of breast cancer-remote 07/27/2013  . Poorly controlled type 2 diabetes mellitus ([place]) 07/06/2013  . History of breast lump/mass excision 09/26/2012  . Breast screening 09/26/2012  . Major depressive disorder, single episode, mild ([place]) 03/02/2012  . High triglycerides 11/18/2010  . Essential hypertension, benign 11/20/2008  . ALLERGIC RHINITIS 03/06/2007    Past Surgical History:  Procedure Laterality Date  . BREAST BIOPSY Right 2013  . cyst removed from ovary    . KNEE SURGERY Left   . plantar fasciitis release Bilateral   . TUBAL LIGATION      Prior to Admission medications   Medication Sig Start Date End Date Taking? Authorizing Provider  azithromycin (ZITHROMAX Z-PAK) 250 MG tablet Take 2 tablets (500 mg) on  Day 1,   followed by 1 tablet (250 mg) once daily on Days 2 through 5. 07/08/16 07/13/16  Darel Hong, MD  cyclobenzaprine (FLEXERIL) 10 MG tablet Take 0.5-1 tablets (5-10 mg total) by mouth at bedtime as needed for muscle spasms. 07/06/16   Amy Cletis Athens, MD  famotidine (PEPCID) 20 MG tablet Take 1 tablet (20 mg total) by mouth 2 (two) times daily. 07/22/15   Carrie Mew, MD  fenofibrate 160 MG tablet Take 1 tablet (160 mg total) by mouth daily. 08/07/15   Amy Cletis Athens, MD  gabapentin (NEURONTIN) 100 MG capsule TAKE 1 CAPSULE (100 MG TOTAL) BY MOUTH 3 (THREE) TIMES DAILY. 08/14/15   Amy Cletis Athens, MD  glipiZIDE (GLUCOTROL XL) 10 MG 24 hr tablet Take 1 tablet (10 mg total) by mouth daily with breakfast. 05/21/16   Amy Cletis Athens, MD  glucose blood (ACCU-CHEK SMARTVIEW) test strip Use to check blood sugar one to two times daily.  Dx: E11.9 03/05/16   Jinny Sanders, MD  HYDROcodone-acetaminophen (NORCO) 5-325 MG tablet Take 1 tablet by mouth every 4 (four) hours as needed for moderate pain. 07/08/16   Darel Hong, MD  losartan-hydrochlorothiazide (HYZAAR) 50-12.5 MG tablet Take 1 tablet by mouth daily. 05/21/16   Amy Cletis Athens, MD  metFORMIN (GLUCOPHAGE) 1000 MG tablet Take 0.5 tablets (500 mg total) by mouth 2 (two) times daily with a meal. 05/21/16   Amy Cletis Athens, MD  Multiple Vitamin (MULTIVITAMIN) tablet Take 1 tablet by mouth daily.      Historical Provider, MD  omeprazole (PRILOSEC) 40 MG capsule Take  1 capsule (40 mg total) by mouth daily. 07/24/15   Ria Bush, MD  ondansetron (ZOFRAN ODT) 4 MG disintegrating tablet Take 1 tablet (4 mg total) by mouth every 8 (eight) hours as needed for nausea or vomiting. 07/22/15   Carrie Mew, MD  sucralfate (CARAFATE) 1 g tablet Take 1 tablet (1 g total) by mouth 4 (four) times daily. 07/22/15   Carrie Mew, MD  tamsulosin (FLOMAX) 0.4 MG CAPS capsule Take 1 capsule (0.4 mg total) by mouth daily. 04/01/16   Amy Cletis Athens, MD  valACYclovir (VALTREX) 1000  MG tablet Take 1 tablet (1,000 mg total) by mouth 3 (three) times daily. 02/13/16   Amy Cletis Athens, MD  venlafaxine XR (EFFEXOR-XR) 37.5 MG 24 hr capsule TAKE ONE CAPSULE BY MOUTH EVERY MORNING WITH BREAKFAST 05/21/16   Jinny Sanders, MD    Allergies Penicillins; Keflex [cephalexin]; and Methyldopa  Family History  Problem Relation Age of Onset  . Breast cancer Maternal Aunt     great Aunts   . Breast cancer Paternal Aunt     Social History Social History  Substance Use Topics  . Smoking status: Current Every Day Smoker    Packs/day: 0.30    Types: Cigarettes  . Smokeless tobacco: Never Used  . Alcohol use No    Review of Systems Constitutional: Positive fever negative chills Eyes: No visual changes. ENT: No sore throat. Positive ear pain and discharge Cardiovascular: Denies chest pain. Respiratory: Denies shortness of breath. Gastrointestinal: No abdominal pain.  No nausea, no vomiting.  No diarrhea.  No constipation. Genitourinary: Negative for dysuria. Musculoskeletal: Negative for back pain. Skin: Negative for rash. Neurological: Negative for headaches, focal weakness or numbness.  10-point ROS otherwise negative.  ____________________________________________   PHYSICAL EXAM:  VITAL SIGNS: ED Triage Vitals [07/07/16 2225]  Enc Vitals Group     BP 135/85     Pulse Rate (!) 117     Resp 18     Temp 99.3 F (37.4 C)     Temp Source Oral     SpO2 95 %     Weight 168 lb (76.2 kg)     Height 5\' 6"  (1.676 m)     Head Circumference      Peak Flow      Pain Score 9     Pain Loc      Pain Edu?      Excl. in [place]?     Constitutional: Alert and oriented x 4 Tearful and uncomfortable appearing Eyes: PERRL EOMI. Head: Atraumatic. Nose: No congestion/rhinnorhea. Mouth/Throat: Left tympanic membrane normal right tympanic membrane with perforation leaking clear fluid no mastoid tenderness Neck: No stridor.   Cardiovascular: Tachycardic rate, regular rhythm. Grossly  normal heart sounds.  Good peripheral circulation. Respiratory: Normal respiratory effort.  No retractions. Lungs CTAB and moving good air Gastrointestinal: Soft nondistended nontender no rebound no guarding no peritonitis no McBurney's tenderness negative Rovsing's no costovertebral tenderness negative Murphy's Musculoskeletal: No lower extremity edema   Neurologic:  Normal speech and language. No gross focal neurologic deficits are appreciated. Skin:  Skin is warm, dry and intact. No rash noted. Psychiatric: Mood and affect are normal. Speech and behavior are normal.    ____________________________________________   DIFFERENTIAL  Otitis media, tympanic membrane perforation, mastoiditis, deep space infection ____________________________________________   LABS (all labs ordered are listed, but only abnormal results are displayed)  Labs Reviewed - No data to display   __________________________________________  EKG   ____________________________________________  RADIOLOGY  ____________________________________________   PROCEDURES  Procedure(s) performed: no  Procedures  Critical Care performed: no  ____________________________________________   INITIAL IMPRESSION / ASSESSMENT AND PLAN / ED COURSE  Pertinent labs & imaging results that were available during my care of the patient were reviewed by me and considered in my medical decision making (see chart for details).  The patient is tachycardic and uncomfortable appearing with clear discharge coming from her right ear. Her symptoms are consistent with perforated otitis media. She is allergic to penicillin so give her azithromycin as well as fluids, Toradol, and lidocaine intra-auricularly.     ----------------------------------------- 4:14 AM on 07/08/2016 -----------------------------------------  The patient's pain is nearly resolved. She is well-appearing and her tachycardia has normalized. She has seen  Dr. Tami Ribas in the past so we'll refer her to Dr. Tami Ribas for reevaluation. ____________________________________________   FINAL CLINICAL IMPRESSION(S) / ED DIAGNOSES  Final diagnoses:  Acute otitis media, unspecified otitis media type  Perforation of right tympanic membrane      NEW MEDICATIONS STARTED DURING THIS VISIT:  New Prescriptions   AZITHROMYCIN (ZITHROMAX Z-PAK) 250 MG TABLET    Take 2 tablets (500 mg) on  Day 1,  followed by 1 tablet (250 mg) once daily on Days 2 through 5.   HYDROCODONE-ACETAMINOPHEN (NORCO) 5-325 MG TABLET    Take 1 tablet by mouth every 4 (four) hours as needed for moderate pain.     Note:  This document was prepared using Dragon voice recognition software and may include unintentional dictation errors.     Darel Hong, MD 07/08/16 [phone]

## 2016-07-08 NOTE — ED Notes (Signed)
Pharmacy called to send up lido

## 2016-07-08 NOTE — Discharge Instructions (Signed)
Please take all of your antibiotics as prescribed. The most important thing with a ruptured eardrum is that you cannot get water inside her ear. Please no swimming, no surfing, and no baths . Use earplugs when you shower. Return to the emergency department for any new or worsening symptoms such as worsening pain, fevers, chills, or for any other concerns. Otherwise please follow-up with an otolaryngologist in 1 week for recheck.  It was a pleasure to take care of you today, and thank you for coming to our emergency department.  If you have any questions or concerns before leaving please ask the nurse to grab me and I'm more than happy to go through your aftercare instructions again.  If you were prescribed any opioid pain medication today such as Norco, Vicodin, Percocet, morphine, hydrocodone, or oxycodone please make sure you do not drive when you are taking this medication as it can alter your ability to drive safely.  If you have any concerns once you are home that you are not improving or are in fact getting worse before you can make it to your follow-up appointment, please do not hesitate to call [phone] and come back for further evaluation.  Darel Hong MD  Results for orders placed or performed in visit on 06/08/16  HM DIABETES EYE EXAM  Result Value Ref Range   HM Diabetic Eye Exam  No Retinopathy

## 2016-07-08 NOTE — ED Notes (Signed)
Patient co increased ear pain from xylocaine wearing off.  Notifying MD so he can re-administer more to the right ear

## 2016-07-12 DIAGNOSIS — H9211 Otorrhea, right ear: Secondary | ICD-10-CM | POA: Diagnosis not present

## 2016-07-12 DIAGNOSIS — H66001 Acute suppurative otitis media without spontaneous rupture of ear drum, right ear: Secondary | ICD-10-CM | POA: Diagnosis not present

## 2016-07-26 DIAGNOSIS — H65111 Acute and subacute allergic otitis media (mucoid) (sanguinous) (serous), right ear: Secondary | ICD-10-CM | POA: Diagnosis not present

## 2016-08-13 ENCOUNTER — Other Ambulatory Visit: Payer: Self-pay | Admitting: *Deleted

## 2016-08-13 MED ORDER — ONETOUCH DELICA LANCETS 33G MISC: 3 refills | Status: DC

## 2016-08-13 MED ORDER — GLUCOSE BLOOD VI STRP: ORAL_STRIP | 3 refills | Status: DC

## 2016-08-13 MED ORDER — ONETOUCH ULTRA SYSTEM W/DEVICE KIT: PACK | 0 refills | Status: AC

## 2016-08-17 ENCOUNTER — Telehealth: Payer: Self-pay | Admitting: General Surgery

## 2016-08-17 NOTE — Telephone Encounter (Signed)
LEFT MESSAGE ON PATIENT'S CELL NUMBER TO CALL BACK AND RESCHEDULE APPOINTMENT ON 09-14-16 @ 4:00 PM.SHE CAN MOVE TO AN EARLIER TIME ON 09-14-16 OR TO A DIFFERENT DAY,DR [facility] WILL NOT BE IN THE OFFICE LATE PM.

## 2016-08-23 ENCOUNTER — Encounter: Payer: Self-pay | Admitting: *Deleted

## 2016-08-31 ENCOUNTER — Ambulatory Visit
Admission: RE | Admit: 2016-08-31 | Discharge: 2016-08-31 | Disposition: A | Discharge status: Home / Self Care | Payer: 59 | Source: Ambulatory Visit | Attending: General Surgery | Admitting: General Surgery

## 2016-08-31 DIAGNOSIS — Z1231 Encounter for screening mammogram for malignant neoplasm of breast: Secondary | ICD-10-CM

## 2016-09-14 ENCOUNTER — Ambulatory Visit: Payer: Self-pay | Admitting: General Surgery

## 2016-09-21 ENCOUNTER — Ambulatory Visit: Payer: Self-pay | Admitting: General Surgery

## 2016-09-21 DIAGNOSIS — M7712 Lateral epicondylitis, left elbow: Secondary | ICD-10-CM | POA: Diagnosis not present

## 2016-10-11 ENCOUNTER — Encounter: Payer: Self-pay | Admitting: Family Medicine

## 2016-10-13 ENCOUNTER — Encounter: Payer: Self-pay | Admitting: General Surgery

## 2016-10-13 ENCOUNTER — Ambulatory Visit (INDEPENDENT_AMBULATORY_CARE_PROVIDER_SITE_OTHER): Payer: 59 | Admitting: General Surgery

## 2016-10-13 VITALS — BP 138/72 | HR 100 | Resp 12 | Ht 66.0 in | Wt 175.0 lb

## 2016-10-13 DIAGNOSIS — Z86018 Personal history of other benign neoplasm: Secondary | ICD-10-CM

## 2016-10-13 DIAGNOSIS — Z9889 Other specified postprocedural states: Secondary | ICD-10-CM

## 2016-10-13 NOTE — Progress Notes (Signed)
Patient ID: Alyssa Warren, female   DOB: 1971-03-10, 46 y.o.   MRN: [id number]  Chief Complaint  Patient presents with  . Follow-up    mammogram    HPI Daniele Dillow is a 46 y.o. female who presents for a breast evaluation. The most recent mammogram was done on 08/31/2016 .  Patient does perform regular self breast checks and gets regular mammograms done.    HPI  Past Medical History:  Diagnosis Date  . Allergy   . Diabetes mellitus without complication ([place])   . Hypertension     Past Surgical History:  Procedure Laterality Date  . BREAST BIOPSY Right 02/2012   x2. benign  . cyst removed from ovary    . KNEE SURGERY Left   . plantar fasciitis release Bilateral   . TUBAL LIGATION      Family History  Problem Relation Age of Onset  . Breast cancer Maternal Aunt        great Aunts   . Breast cancer Paternal Aunt     Social History Social History  Substance Use Topics  . Smoking status: Current Every Day Smoker    Packs/day: 0.30    Types: Cigarettes  . Smokeless tobacco: Never Used  . Alcohol use No    Allergies  Allergen Reactions  . Penicillins Hives and Itching  . Keflex [Cephalexin]   . Methyldopa Other (See Comments)    REACTION: unspecified    Current Outpatient Prescriptions  Medication Sig Dispense Refill  . Blood Glucose Monitoring Suppl (ONE TOUCH ULTRA SYSTEM KIT) w/Device KIT Use to check blood sugar one to two times daily.  Dx: E11.9 1 each 0  . cyclobenzaprine (FLEXERIL) 10 MG tablet Take 0.5-1 tablets (5-10 mg total) by mouth at bedtime as needed for muscle spasms. 15 tablet 0  . famotidine (PEPCID) 20 MG tablet Take 1 tablet (20 mg total) by mouth 2 (two) times daily. 60 tablet 0  . fenofibrate 160 MG tablet Take 1 tablet (160 mg total) by mouth daily. 30 tablet 11  . gabapentin (NEURONTIN) 100 MG capsule TAKE 1 CAPSULE (100 MG TOTAL) BY MOUTH 3 (THREE) TIMES DAILY. 90 capsule 0  . glipiZIDE (GLUCOTROL XL) 10 MG 24 hr tablet  Take 1 tablet (10 mg total) by mouth daily with breakfast. 90 tablet 3  . glucose blood (ONE TOUCH ULTRA TEST) test strip Use to check blood sugar one to two times daily.  Dx: E11.9 200 each 3  . HYDROcodone-acetaminophen (NORCO) 5-325 MG tablet Take 1 tablet by mouth every 4 (four) hours as needed for moderate pain. 7 tablet 0  . losartan-hydrochlorothiazide (HYZAAR) 50-12.5 MG tablet Take 1 tablet by mouth daily. 90 tablet 3  . metFORMIN (GLUCOPHAGE) 1000 MG tablet Take 0.5 tablets (500 mg total) by mouth 2 (two) times daily with a meal. 90 tablet 3  . Multiple Vitamin (MULTIVITAMIN) tablet Take 1 tablet by mouth daily.      Marland Kitchen omeprazole (PRILOSEC) 40 MG capsule Take 1 capsule (40 mg total) by mouth daily. 30 capsule 3  . ondansetron (ZOFRAN ODT) 4 MG disintegrating tablet Take 1 tablet (4 mg total) by mouth every 8 (eight) hours as needed for nausea or vomiting. 20 tablet 0  . ONETOUCH DELICA LANCETS 29B MISC Use to check blood sugar one to two times daily.  Dx: E11.9 200 each 3  . sucralfate (CARAFATE) 1 g tablet Take 1 tablet (1 g total) by mouth 4 (four) times daily. 120 tablet 1  .  tamsulosin (FLOMAX) 0.4 MG CAPS capsule Take 1 capsule (0.4 mg total) by mouth daily. 15 capsule 0  . valACYclovir (VALTREX) 1000 MG tablet Take 1 tablet (1,000 mg total) by mouth 3 (three) times daily. (Patient taking differently: Take 1,000 mg by mouth as needed. ) 21 tablet 0  . venlafaxine XR (EFFEXOR-XR) 37.5 MG 24 hr capsule TAKE ONE CAPSULE BY MOUTH EVERY MORNING WITH BREAKFAST 90 capsule 3   No current facility-administered medications for this visit.     Review of Systems Review of Systems  Constitutional: Negative.   Respiratory: Negative.   Cardiovascular: Negative.     Blood pressure 138/72, pulse 100, resp. rate 12, height '5\' 6"'  (1.676 m), weight 175 lb (79.4 kg), last menstrual period 10/06/2016.  Physical Exam Physical Exam  Constitutional: She is oriented to person, place, and time. She  appears well-developed and well-nourished.  Eyes: Conjunctivae are normal. No scleral icterus.  Neck: Neck supple.  Cardiovascular: Normal rate, regular rhythm and normal heart sounds.   Pulmonary/Chest: Effort normal and breath sounds normal. Right breast exhibits no inverted nipple, no mass, no nipple discharge, no skin change and no tenderness. Left breast exhibits no inverted nipple, no mass, no nipple discharge, no skin change and no tenderness.  Abdominal: Soft. Bowel sounds are normal. There is no tenderness.  Lymphadenopathy:    She has no cervical adenopathy.    She has no axillary adenopathy.  Neurological: She is alert and oriented to person, place, and time.  Skin: Skin is warm and dry.    Data Reviewed Prior notes and mammogram reviewed.  Assessment    Benign breast mass. Remote family history of breast cancer Stable exam otherwise.    Plan      Patient to return to her PCP or OBGYN for mammogram and breast checks. Return as needed. The patient is aware to call back for any questions or concerns.   HPI, Physical Exam, Assessment and Plan have been scribed under the direction and in the presence of Mckinley Jewel, MD  Gaspar Cola, CMA  I have completed the exam and reviewed the above documentation for accuracy and completeness.  I agree with the above.  Haematologist has been used and any errors in dictation or transcription are unintentional.  Quianna Avery G. Jamal Collin, M.D., F.A.C.S.  Junie Panning G 10/13/2016, 4:39 PM

## 2016-10-13 NOTE — Patient Instructions (Addendum)
Patient to return to her PCP or OBGYN for mammogram and breast checks. Return as needed. The patient is aware to call back for any questions or concerns.

## 2016-11-01 ENCOUNTER — Ambulatory Visit (INDEPENDENT_AMBULATORY_CARE_PROVIDER_SITE_OTHER): Payer: 59 | Admitting: Obstetrics & Gynecology

## 2016-11-01 ENCOUNTER — Encounter: Payer: Self-pay | Admitting: Obstetrics & Gynecology

## 2016-11-01 VITALS — BP 138/82 | HR 95 | Ht 66.0 in | Wt 172.0 lb

## 2016-11-01 DIAGNOSIS — R87610 Atypical squamous cells of undetermined significance on cytologic smear of cervix (ASC-US): Secondary | ICD-10-CM | POA: Diagnosis not present

## 2016-11-01 DIAGNOSIS — R1032 Left lower quadrant pain: Secondary | ICD-10-CM | POA: Diagnosis not present

## 2016-11-01 DIAGNOSIS — N926 Irregular menstruation, unspecified: Secondary | ICD-10-CM | POA: Diagnosis not present

## 2016-11-01 NOTE — Patient Instructions (Signed)
Perimenopause Perimenopause is the time when your body begins to move into the menopause (no menstrual period for 12 straight months). It is a natural process. Perimenopause can begin 2-8 years before the menopause and usually lasts for 1 year after the menopause. During this time, your ovaries may or may not produce an egg. The ovaries vary in their production of estrogen and progesterone hormones each month. This can cause irregular menstrual periods, difficulty getting pregnant, vaginal bleeding between periods, and uncomfortable symptoms. What are the causes?  Irregular production of the ovarian hormones, estrogen and progesterone, and not ovulating every month. Other causes include:  Tumor of the pituitary gland in the brain.  Medical disease that affects the ovaries.  Radiation treatment.  Chemotherapy.  Unknown causes.  Heavy smoking and excessive alcohol intake can bring on perimenopause sooner. What are the signs or symptoms?  Hot flashes.  Night sweats.  Irregular menstrual periods.  Decreased sex drive.  Vaginal dryness.  Headaches.  Mood swings.  Depression.  Memory problems.  Irritability.  Tiredness.  Weight gain.  Trouble getting pregnant.  The beginning of losing bone cells (osteoporosis).  The beginning of hardening of the arteries (atherosclerosis). How is this diagnosed? Your health care provider will make a diagnosis by analyzing your age, menstrual history, and symptoms. He or she will do a physical exam and note any changes in your body, especially your female organs. Female hormone tests may or may not be helpful depending on the amount of female hormones you produce and when you produce them. However, other hormone tests may be helpful to rule out other problems. How is this treated? In some cases, no treatment is needed. The decision on whether treatment is necessary during the perimenopause should be made by you and your health care  provider based on how the symptoms are affecting you and your lifestyle. Various treatments are available, such as:  Treating individual symptoms with a specific medicine for that symptom.  Herbal medicines that can help specific symptoms.  Counseling.  Group therapy. Follow these instructions at home:  Keep track of your menstrual periods (when they occur, how heavy they are, how long between periods, and how long they last) as well as your symptoms and when they started.  Only take over-the-counter or prescription medicines as directed by your health care provider.  Sleep and rest.  Exercise.  Eat a diet that contains calcium (good for your bones) and soy (acts like the estrogen hormone).  Do not smoke.  Avoid alcoholic beverages.  Take vitamin supplements as recommended by your health care provider. Taking vitamin E may help in certain cases.  Take calcium and vitamin D supplements to help prevent bone loss.  Group therapy is sometimes helpful.  Acupuncture may help in some cases. Contact a health care provider if:  You have questions about any symptoms you are having.  You need a referral to a specialist (gynecologist, psychiatrist, or psychologist). Get help right away if:  You have vaginal bleeding.  Your period lasts longer than 8 days.  Your periods are recurring sooner than 21 days.  You have bleeding after intercourse.  You have severe depression.  You have pain when you urinate.  You have severe headaches.  You have vision problems. This information is not intended to replace advice given to you by your health care provider. Make sure you discuss any questions you have with your health care provider. Document Released: 05/20/2004 Document Revised: 09/18/2015 Document Reviewed: 11/09/2012 Elsevier Interactive   Patient Education  2017 Elsevier Inc.  

## 2016-11-01 NOTE — Progress Notes (Signed)
Gynecology Pelvic Pain Evaluation   Chief Complaint:  Chief Complaint  Patient presents with  . Pelvic Pain    left side   History of Present Illness:   Patient is a 46 y.o. [id number] who LMP was Patient's last menstrual period was 10/29/2016., presents today for a problem visit.  She complains of abnormal bleeding and pain.   Her pain is localized to the LLQ and left side area, described as intermittent, began several months ago and its severity is described as severe. The pain radiates to the  back. She has these associated symptoms which include nausea. Patient has these modifiers which include relaxation and pain medication that make it better and unable to associate with any factor that make it worse.  Context includes: spontaneous.  She notes no pain assoc w periods; she does describe worsening ibterval and irregularity of periods over the last year, coming sometimes twice a month.  Denies dyspareunia.  Pt has h/o PCOS, has had one lap for cystectomy years ago.  S/p BTL, no desire for future pregnancy.  Previous evaluation: none. Prior Diagnosis: PCOS.  Not menopausal yet.Marland Kitchen Previous Treatment: none.  PMHx: She  has a past medical history of Allergy; Diabetes mellitus without complication ([place]); and Hypertension. Also,  has a past surgical history that includes Tubal ligation; plantar fasciitis release (Bilateral); Knee surgery (Left); cyst removed from ovary; and Breast biopsy (Right, 02/2012)., family history includes Breast cancer in her maternal aunt and paternal aunt.,  reports that she has been smoking Cigarettes.  She has been smoking about 0.30 packs per day. She has never used smokeless tobacco. She reports that she does not drink alcohol or use drugs.  She has a current medication list which includes the following prescription(s): one touch ultra system kit, cyclobenzaprine, famotidine, fenofibrate, gabapentin, glipizide, glucose blood, hydrocodone-acetaminophen,  losartan-hydrochlorothiazide, metformin, multivitamin, omeprazole, ondansetron, onetouch delica lancets 62I, sucralfate, tamsulosin, valacyclovir, and venlafaxine xr. Also, is allergic to penicillins; keflex [cephalexin]; and methyldopa.  Review of Systems  Constitutional: Negative for chills, fever and malaise/fatigue.  HENT: Negative for congestion, sinus pain and sore throat.   Eyes: Negative for blurred vision and pain.  Respiratory: Negative for cough and wheezing.   Cardiovascular: Negative for chest pain and leg swelling.  Gastrointestinal: Negative for abdominal pain, constipation, diarrhea, heartburn, nausea and vomiting.  Genitourinary: Negative for dysuria, frequency, hematuria and urgency.  Musculoskeletal: Negative for back pain, joint pain, myalgias and neck pain.  Skin: Negative for itching and rash.  Neurological: Negative for dizziness, tremors and weakness.  Endo/Heme/Allergies: Does not bruise/bleed easily.  Psychiatric/Behavioral: Negative for depression. The patient is not nervous/anxious and does not have insomnia.     Objective: BP 138/82   Pulse 95   Ht _0  (1.676 m)   Wt 172 lb (78 kg)   LMP 10/29/2016   BMI 27.76 kg/m  Physical Exam  Constitutional: She is oriented to person, place, and time. She appears well-developed and well-nourished. No distress.  Genitourinary: Rectum normal, vagina normal and uterus normal. Pelvic exam was performed with patient supine. There is no rash or lesion on the right labia. There is no rash or lesion on the left labia. Vagina exhibits no lesion. No bleeding in the vagina. Right adnexum does not display mass and does not display tenderness.  Left adnexum displays tenderness. Left adnexum does not display mass. Cervix does not exhibit motion tenderness, lesion, friability or polyp.   Uterus is mobile and midaxial. Uterus is not enlarged or exhibiting  a mass.  HENT:  Head: Normocephalic and atraumatic. Head is without  laceration.  Right Ear: Hearing normal.  Left Ear: Hearing normal.  Nose: No epistaxis.  No foreign bodies.  Mouth/Throat: Uvula is midline, oropharynx is clear and moist and mucous membranes are normal.  Eyes: Pupils are equal, round, and reactive to light.  Neck: Normal range of motion. Neck supple. No thyromegaly present.  Cardiovascular: Normal rate and regular rhythm.  Exam reveals no gallop and no friction rub.   No murmur heard. Pulmonary/Chest: Effort normal and breath sounds normal. No respiratory distress. She has no wheezes. Right breast exhibits no mass, no skin change and no tenderness. Left breast exhibits no mass, no skin change and no tenderness.  Abdominal: Soft. Bowel sounds are normal. She exhibits no distension. There is no tenderness. There is no rebound.  Musculoskeletal: Normal range of motion.  Neurological: She is alert and oriented to person, place, and time. No cranial nerve deficit.  Skin: Skin is warm and dry.  Psychiatric: She has a normal mood and affect. Judgment normal.  Vitals reviewed.   Female chaperone present for pelvic portion of the physical exam  Assessment: 46 y.o. [id number] with Left Lower Quadrant Pain, Irregular Menses.  1. Left lower quadrant pain - US Transvaginal Non-OB; Future - Pain on left side, from adhesions, cyst, nerve pbm, other.  [place] to start w/u.  May need Dx Lap and this is discussed.  Would consider LSO at that time to remove from possibilities, and right ovary should be adequate to maintain normal hormones until natural menopause.  Ablation for AUB at same time also to be considered.  2. Irregular menses - IGP,CtNg,AptimaHPV - US Transvaginal Non-OB; Future - Options of eval and tx discussed. Patient has abnormal uterine bleeding . She has a normal exam today, with no evidence of lesions.  Evaluation includes the following: exam, labs such as hormonal testing, and pelvic ultrasound to evaluate for any structural gynecologic  abnormalities.  Patient to follow up after testing.  Treatment option for menorrhagia or menometrorrhagia discussed in great detail with the patient.  Options include hormonal therapy, IUD therapy such as Mirena, D&C, Ablation, and Hysterectomy.  The pros and cons of each option discussed with patient.  Barnett Applebaum, MD, [facility], [facility] 11/01/2016  8:30 AM

## 2016-11-04 LAB — IGP,CTNG,APTIMAHPV
CHLAMYDIA, NUC. ACID AMP: NEGATIVE
Gonococcus by Nucleic Acid Amp: NEGATIVE
HPV APTIMA: NEGATIVE
PAP SMEAR COMMENT: 0

## 2016-11-05 ENCOUNTER — Encounter: Payer: Self-pay | Admitting: Family Medicine

## 2016-11-05 MED ORDER — METFORMIN HCL 1000 MG PO TABS: 1000.0000 mg | ORAL_TABLET | Freq: Two times a day (BID) | ORAL | 1 refills | Status: DC

## 2016-11-17 ENCOUNTER — Telehealth: Payer: Self-pay | Admitting: Obstetrics & Gynecology

## 2016-11-17 NOTE — Telephone Encounter (Signed)
Left message with patient ok to be seen.

## 2016-11-17 NOTE — Telephone Encounter (Signed)
OK to have [place] unless she would prefer to reschedule.  [place] not an urgency so would reschedule to Aug when I get back from vacation.

## 2016-11-17 NOTE — Telephone Encounter (Signed)
Pt is schedule tomorrow for a TVUS and has a light menstruation going on and wants to know if she needs to reschedule. Pt doesn't reports Minor pain . Please advise

## 2016-11-18 ENCOUNTER — Ambulatory Visit (INDEPENDENT_AMBULATORY_CARE_PROVIDER_SITE_OTHER): Payer: 59

## 2016-11-18 ENCOUNTER — Encounter: Payer: Self-pay | Admitting: Obstetrics & Gynecology

## 2016-11-18 ENCOUNTER — Telehealth: Payer: Self-pay | Admitting: Obstetrics & Gynecology

## 2016-11-18 ENCOUNTER — Ambulatory Visit (INDEPENDENT_AMBULATORY_CARE_PROVIDER_SITE_OTHER): Payer: 59 | Admitting: Obstetrics & Gynecology

## 2016-11-18 VITALS — BP 130/90 | HR 92 | Ht 66.0 in | Wt 174.0 lb

## 2016-11-18 DIAGNOSIS — R1032 Left lower quadrant pain: Secondary | ICD-10-CM

## 2016-11-18 DIAGNOSIS — N921 Excessive and frequent menstruation with irregular cycle: Secondary | ICD-10-CM | POA: Diagnosis not present

## 2016-11-18 DIAGNOSIS — N926 Irregular menstruation, unspecified: Secondary | ICD-10-CM

## 2016-11-18 NOTE — Telephone Encounter (Signed)
Patient is scheduled for H&P at [facility] on 12/13/16 @ 4:30pm w/ Dr. Kenton Kingfisher, with Pre-admit phone interview following, and OR on 12/23/16. Patient noted she is diabetic and needs to be scheduled early, if she waits too long to eat it throws her blood sugar off for several days. I have contacted Denny Peon who is aware.

## 2016-11-18 NOTE — Progress Notes (Signed)
  HPI: Pt has pain and bleeding- periods twice a month and last 12 days; also has pain specific to the LLQ, no rad, moderate, long standing, no assoc sx's or modifiers   Ultrasound demonstrates no masses seen These findings are Pelvis normal  PMHx: She  has a past medical history of Allergy; Diabetes mellitus without complication ([place]); and Hypertension. Also,  has a past surgical history that includes Tubal ligation; plantar fasciitis release (Bilateral); Knee surgery (Left); cyst removed from ovary; and Breast biopsy (Right, 02/2012)., family history includes Breast cancer in her maternal aunt and paternal aunt.,  reports that she has been smoking Cigarettes.  She has been smoking about 0.30 packs per day. She has never used smokeless tobacco. She reports that she does not drink alcohol or use drugs.  She has a current medication list which includes the following prescription(s): one touch ultra system kit, cyclobenzaprine, famotidine, fenofibrate, gabapentin, glipizide, glucose blood, hydrocodone-acetaminophen, losartan-hydrochlorothiazide, metformin, multivitamin, omeprazole, ondansetron, onetouch delica lancets 49Z, sucralfate, tamsulosin, valacyclovir, and venlafaxine xr. Also, is allergic to penicillins; keflex [cephalexin]; and methyldopa.  Review of Systems  All other systems reviewed and are negative.   Objective: BP 130/90   Pulse 92   Ht '5\' 6"'$  (1.676 m)   Wt 174 lb (78.9 kg)   LMP 10/29/2016   BMI 28.08 kg/m   Physical examination Constitutional NAD, Conversant  Skin No rashes, lesions or ulceration.   Extremities: Moves all appropriately.  Normal ROM for age. No lymphadenopathy.  Neuro: Grossly intact  Psych: Oriented to PPT.  Normal mood. Normal affect.   Assessment:  Left lower quadrant pain  Irregular menses  Menorrhagia with irregular cycle  Options as before discussed (see prior note)    Dx Lap, LSO, Hyst D&C w Ablation to be schduled Aug 30    Pros and cons  discussed  Barnett Applebaum, MD, [facility], [facility] 11/18/2016  11:59 AM

## 2016-11-18 NOTE — Telephone Encounter (Signed)
-----   Message from Gae Dry, MD sent at 11/18/2016 11:53 AM EDT ----- Regarding: surgery Surgery Booking Request Patient Full Name:   MRN: [id number]  DOB: Feb 15, 1971  Surgeon: Hoyt Koch, MD  Requested Surgery Date and Time: Aug 30 Primary Diagnosis AND Code: Left lower quadrant pain R10.32 and menorrhagia N92.1 Secondary Diagnosis and Code:  Surgical Procedure: Lap LSO and Hyst D&C Ablation L&D Notification: No Admission Status: same day surgery Length of Surgery: 1 Special Case Needs: Endometrial ablation- [place] H&P: yes (date) Phone Interview???: yes Interpreter: Language:  Medical Clearance: no Special Scheduling Instructions: no

## 2016-12-13 ENCOUNTER — Ambulatory Visit (INDEPENDENT_AMBULATORY_CARE_PROVIDER_SITE_OTHER): Payer: 59 | Admitting: Obstetrics & Gynecology

## 2016-12-13 ENCOUNTER — Encounter: Payer: Self-pay | Admitting: Obstetrics & Gynecology

## 2016-12-13 VITALS — BP 130/90 | HR 80 | Ht 66.0 in | Wt 178.0 lb

## 2016-12-13 DIAGNOSIS — R109 Unspecified abdominal pain: Secondary | ICD-10-CM | POA: Diagnosis not present

## 2016-12-13 DIAGNOSIS — N921 Excessive and frequent menstruation with irregular cycle: Secondary | ICD-10-CM | POA: Diagnosis not present

## 2016-12-13 NOTE — Progress Notes (Signed)
PRE-OPERATIVE HISTORY AND PHYSICAL EXAM  HPI:  Alyssa Warren is a 46 y.o. [phone] Patient's last menstrual period was 11/28/2016.; she is being admitted for surgery related to pelvic pain and menometrorrhagia.  Her pain is localized to the LLQ and left side area, described as intermittent, began several months ago and its severity is described as severe. The pain radiates to the  back. She has these associated symptoms which include nausea. Patient has these modifiers which include relaxation and pain medication that make it better and unable to associate with any factor that make it worse.  Context includes: spontaneous.  She notes no pain assoc w periods; she does describe worsening ibterval and irregularity of periods over the last year, coming sometimes twice a month.  Denies dyspareunia.  Pt has h/o PCOS, has had one lap for cystectomy years ago.  S/p BTL, no desire for future pregnancy.  Pt has had recent normal [place].  Does not want any further hormonal therapy.  PMHx: Past Medical History:  Diagnosis Date  . Allergy   . Diabetes mellitus without complication ([place])   . Hypertension    Past Surgical History:  Procedure Laterality Date  . BREAST BIOPSY Right 02/2012   x2. benign  . cyst removed from ovary    . KNEE SURGERY Left   . plantar fasciitis release Bilateral   . TUBAL LIGATION     Family History  Problem Relation Age of Onset  . Breast cancer Maternal Aunt        great Aunts   . Breast cancer Paternal Aunt    Social History  Substance Use Topics  . Smoking status: Current Every Day Smoker    Packs/day: 0.30    Types: Cigarettes  . Smokeless tobacco: Never Used  . Alcohol use No    Current Outpatient Prescriptions:  .  Blood Glucose Monitoring Suppl (ONE TOUCH ULTRA SYSTEM KIT) w/Device KIT, Use to check blood sugar one to two times daily.  Dx: E11.9, Disp: 1 each, Rfl: 0 .  cyclobenzaprine (FLEXERIL) 10 MG tablet, Take 0.5-1 tablets (5-10 mg total) by mouth  at bedtime as needed for muscle spasms. (Patient not taking: Reported on 12/13/2016), Disp: 15 tablet, Rfl: 0 .  famotidine (PEPCID) 20 MG tablet, Take 1 tablet (20 mg total) by mouth 2 (two) times daily. (Patient not taking: Reported on 12/13/2016), Disp: 60 tablet, Rfl: 0 .  fenofibrate 160 MG tablet, Take 1 tablet (160 mg total) by mouth daily. (Patient not taking: Reported on 12/13/2016), Disp: 30 tablet, Rfl: 11 .  gabapentin (NEURONTIN) 100 MG capsule, TAKE 1 CAPSULE (100 MG TOTAL) BY MOUTH 3 (THREE) TIMES DAILY. (Patient not taking: Reported on 12/13/2016), Disp: 90 capsule, Rfl: 0 .  glipiZIDE (GLUCOTROL XL) 10 MG 24 hr tablet, Take 1 tablet (10 mg total) by mouth daily with breakfast. (Patient taking differently: Take 10 mg by mouth at bedtime. ), Disp: 90 tablet, Rfl: 3 .  glucose blood (ONE TOUCH ULTRA TEST) test strip, Use to check blood sugar one to two times daily.  Dx: E11.9, Disp: 200 each, Rfl: 3 .  HYDROcodone-acetaminophen (NORCO) 5-325 MG tablet, Take 1 tablet by mouth every 4 (four) hours as needed for moderate pain. (Patient not taking: Reported on 12/13/2016), Disp: 7 tablet, Rfl: 0 .  ibuprofen (ADVIL,MOTRIN) 200 MG tablet, Take 200 mg by mouth every 8 (eight) hours as needed for mild pain or moderate pain., Disp: , Rfl:  .  losartan-hydrochlorothiazide (HYZAAR) 50-12.5 MG tablet, Take  1 tablet by mouth daily., Disp: 90 tablet, Rfl: 3 .  metFORMIN (GLUCOPHAGE) 1000 MG tablet, Take 1 tablet (1,000 mg total) by mouth 2 (two) times daily with a meal., Disp: 180 tablet, Rfl: 1 .  omeprazole (PRILOSEC) 40 MG capsule, Take 1 capsule (40 mg total) by mouth daily. (Patient taking differently: Take 40 mg by mouth daily as needed (heartburn). ), Disp: 30 capsule, Rfl: 3 .  ondansetron (ZOFRAN ODT) 4 MG disintegrating tablet, Take 1 tablet (4 mg total) by mouth every 8 (eight) hours as needed for nausea or vomiting., Disp: 20 tablet, Rfl: 0 .  ONETOUCH DELICA LANCETS 69C MISC, Use to check blood  sugar one to two times daily.  Dx: E11.9, Disp: 200 each, Rfl: 3 .  sucralfate (CARAFATE) 1 g tablet, Take 1 tablet (1 g total) by mouth 4 (four) times daily. (Patient not taking: Reported on 12/13/2016), Disp: 120 tablet, Rfl: 1 .  tamsulosin (FLOMAX) 0.4 MG CAPS capsule, Take 1 capsule (0.4 mg total) by mouth daily. (Patient not taking: Reported on 12/13/2016), Disp: 15 capsule, Rfl: 0 .  valACYclovir (VALTREX) 1000 MG tablet, Take 1 tablet (1,000 mg total) by mouth 3 (three) times daily. (Patient taking differently: Take 1,000 mg by mouth 3 (three) times daily as needed (for shingles). ), Disp: 21 tablet, Rfl: 0 .  venlafaxine XR (EFFEXOR-XR) 37.5 MG 24 hr capsule, TAKE ONE CAPSULE BY MOUTH EVERY MORNING WITH BREAKFAST (Patient taking differently: Take 37.5 mg by mouth daily at 12 noon. ), Disp: 90 capsule, Rfl: 3  Allergies: Penicillins; Keflex [cephalexin]; and Methyldopa  Review of Systems  Constitutional: Negative for chills, fever and malaise/fatigue.  HENT: Negative for congestion, sinus pain and sore throat.   Eyes: Negative for blurred vision and pain.  Respiratory: Negative for cough and wheezing.   Cardiovascular: Negative for chest pain and leg swelling.  Gastrointestinal: Negative for abdominal pain, constipation, diarrhea, heartburn, nausea and vomiting.  Genitourinary: Negative for dysuria, frequency, hematuria and urgency.  Musculoskeletal: Negative for back pain, joint pain, myalgias and neck pain.  Skin: Negative for itching and rash.  Neurological: Negative for dizziness, tremors and weakness.  Endo/Heme/Allergies: Does not bruise/bleed easily.  Psychiatric/Behavioral: Negative for depression. The patient is not nervous/anxious and does not have insomnia.    Objective: BP 130/90   Pulse 80   Ht '5\' 6"'  (1.676 m)   Wt 178 lb (80.7 kg)   LMP 11/28/2016   BMI 28.73 kg/m   Filed Weights   12/13/16 1623  Weight: 178 lb (80.7 kg)   Physical Exam  Constitutional: She is  oriented to person, place, and time. She appears well-developed and well-nourished. No distress.  Genitourinary: Rectum normal, vagina normal and uterus normal. Pelvic exam was performed with patient supine. There is no rash or lesion on the right labia. There is no rash or lesion on the left labia. Vagina exhibits no lesion. No bleeding in the vagina. Right adnexum does not display mass and does not display tenderness. Left adnexum does not display mass and does not display tenderness. Cervix does not exhibit motion tenderness, lesion, friability or polyp.   Uterus is mobile and midaxial. Uterus is not enlarged or exhibiting a mass.  HENT:  Head: Normocephalic and atraumatic. Head is without laceration.  Right Ear: Hearing normal.  Left Ear: Hearing normal.  Nose: No epistaxis.  No foreign bodies.  Mouth/Throat: Uvula is midline, oropharynx is clear and moist and mucous membranes are normal.  Eyes: Pupils are equal, round,  and reactive to light.  Neck: Normal range of motion. Neck supple. No thyromegaly present.  Cardiovascular: Normal rate and regular rhythm.  Exam reveals no gallop and no friction rub.   No murmur heard. Pulmonary/Chest: Effort normal and breath sounds normal. No respiratory distress. She has no wheezes. Right breast exhibits no mass, no skin change and no tenderness. Left breast exhibits no mass, no skin change and no tenderness.  Abdominal: Soft. Bowel sounds are normal. She exhibits no distension. There is no tenderness. There is no rebound.  Musculoskeletal: Normal range of motion.  Neurological: She is alert and oriented to person, place, and time. No cranial nerve deficit.  Skin: Skin is warm and dry.  Psychiatric: She has a normal mood and affect. Judgment normal.  Vitals reviewed.  Assessment: 1. Left sided abdominal pain   2. Menorrhagia with irregular cycle   Plan Lap LSO and Hyst Ablation Alternatives d/w pt.  Risks fof surgery also counseled as below.  I  have had a careful discussion with this patient about all the options available and the risk/benefits of each. I have fully informed this patient that surgery may subject her to a variety of discomforts and risks: She understands that most patients have surgery with little difficulty, but problems can happen ranging from minor to fatal. These include nausea, vomiting, pain, bleeding, infection, poor healing, hernia, or formation of adhesions. Unexpected reactions may occur from any drug or anesthetic given. Unintended injury may occur to other pelvic or abdominal structures such as Fallopian tubes, ovaries, bladder, ureter (tube from kidney to bladder), or bowel. Nerves going from the pelvis to the legs may be injured. Any such injury may require immediate or later additional surgery to correct the problem. Excessive blood loss requiring transfusion is very unlikely but possible. Dangerous blood clots may form in the legs or lungs. Physical and sexual activity will be restricted in varying degrees for an indeterminate period of time but most often 2-6 weeks.  Finally, she understands that it is impossible to list every possible undesirable effect and that the condition for which surgery is done is not always cured or significantly improved, and in rare cases may be even worse.Ample time was given to answer all questions.  Barnett Applebaum, MD, [facility], [facility] 12/13/2016  4:48 PM

## 2016-12-13 NOTE — Patient Instructions (Signed)
Endometrial Ablation Endometrial ablation is a procedure that destroys the thin inner layer of the lining of the uterus (endometrium). This procedure may be done:  To stop heavy periods.  To stop bleeding that is causing anemia.  To control irregular bleeding.  To treat bleeding caused by small tumors (fibroids) in the endometrium.  This procedure is often an alternative to major surgery, such as removal of the uterus and cervix (hysterectomy). As a result of this procedure:  You may not be able to have children. However, if you are premenopausal (you have not gone through menopause): ? You may still have a small chance of getting pregnant. ? You will need to use a reliable method of birth control after the procedure to prevent pregnancy.  You may stop having a menstrual period, or you may have only a small amount of bleeding during your period. Menstruation may return several years after the procedure.  Tell a health care provider about:  Any allergies you have.  All medicines you are taking, including vitamins, herbs, eye drops, creams, and over-the-counter medicines.  Any problems you or family members have had with the use of anesthetic medicines.  Any blood disorders you have.  Any surgeries you have had.  Any medical conditions you have. What are the risks? Generally, this is a safe procedure. However, problems may occur, including:  A hole (perforation) in the uterus or bowel.  Infection of the uterus, bladder, or vagina.  Bleeding.  Damage to other structures or organs.  An air bubble in the lung (air embolus).  Problems with pregnancy after the procedure.  Failure of the procedure.  Decreased ability to diagnose cancer in the endometrium.  What happens before the procedure?  You will have tests of your endometrium to make sure there are no pre-cancerous cells or cancer cells present.  You may have an ultrasound of the uterus.  You may be given  medicines to thin the endometrium.  Ask your health care provider about: ? Changing or stopping your regular medicines. This is especially important if you take diabetes medicines or blood thinners. ? Taking medicines such as aspirin and ibuprofen. These medicines can thin your blood. Do not take these medicines before your procedure if your doctor tells you not to.  Plan to have someone take you home from the hospital or clinic. What happens during the procedure?  You will lie on an exam table with your feet and legs supported as in a pelvic exam.  To lower your risk of infection: ? Your health care team will wash or sanitize their hands and put on germ-free (sterile) gloves. ? Your genital area will be washed with soap.  An IV tube will be inserted into one of your veins.  You will be given a medicine to help you relax (sedative).  A surgical instrument with a light and camera (resectoscope) will be inserted into your vagina and moved into your uterus. This allows your surgeon to see inside your uterus.  Endometrial tissue will be removed using one of the following methods: ? Radiofrequency. This method uses a radiofrequency-alternating electric current to remove the endometrium. ? Cryotherapy. This method uses extreme cold to freeze the endometrium. ? Heated-free liquid. This method uses a heated saltwater (saline) solution to remove the endometrium. ? Microwave. This method uses high-energy microwaves to heat up the endometrium and remove it. ? Thermal balloon. This method involves inserting a catheter with a balloon tip into the uterus. The balloon tip is   filled with heated fluid to remove the endometrium. The procedure may vary among health care providers and hospitals. What happens after the procedure?  Your blood pressure, heart rate, breathing rate, and blood oxygen level will be monitored until the medicines you were given have worn off.  As tissue healing occurs, you may  notice vaginal bleeding for 4-6 weeks after the procedure. You may also experience: ? Cramps. ? Thin, watery vaginal discharge that is light pink or brown in color. ? A need to urinate more frequently than usual. ? Nausea.  Do not drive for 24 hours if you were given a sedative.  Do not have sex or insert anything into your vagina until your health care provider approves. Summary  Endometrial ablation is done to treat the many causes of heavy menstrual bleeding.  The procedure may be done only after medications have been tried to control the bleeding.  Plan to have someone take you home from the hospital or clinic. This information is not intended to replace advice given to you by your health care provider. Make sure you discuss any questions you have with your health care provider. Document Released: 02/20/2004 Document Revised: 04/29/2016 Document Reviewed: 04/29/2016 Elsevier Interactive Patient Education  2017 [employer]  

## 2016-12-14 ENCOUNTER — Encounter
Admission: RE | Admit: 2016-12-14 | Discharge: 2016-12-14 | Disposition: A | Discharge status: Home / Self Care | Payer: 59 | Source: Ambulatory Visit | Attending: Obstetrics & Gynecology | Admitting: Obstetrics & Gynecology

## 2016-12-14 DIAGNOSIS — I10 Essential (primary) hypertension: Secondary | ICD-10-CM | POA: Insufficient documentation

## 2016-12-14 DIAGNOSIS — Z01818 Encounter for other preprocedural examination: Secondary | ICD-10-CM | POA: Insufficient documentation

## 2016-12-14 DIAGNOSIS — N921 Excessive and frequent menstruation with irregular cycle: Secondary | ICD-10-CM | POA: Insufficient documentation

## 2016-12-14 NOTE — Patient Instructions (Signed)
  Your procedure is scheduled on: 12/23/16 Report to Day Surgery. [facility]OND FLOOR To find out your arrival time please call [phone] between 1PM - 3PM on 12/22/16  Remember: Instructions that are not followed completely may result in serious medical risk, up to and including death, or upon the discretion of your surgeon and anesthesiologist your surgery may need to be rescheduled.    __X__ 1. Do not eat food or drink liquids after midnight. No gum chewing or hard candies.     ____ 2. No Alcohol for 24 hours before or after surgery.   __X__ 3. Do Not Smoke For 24 Hours Prior to Your Surgery.   ____ 4. Bring all medications with you on the day of surgery if instructed.    __X__ 5. Notify your doctor if there is any change in your medical condition     (cold, fever, infections).       Do not wear jewelry, make-up, hairpins, clips or nail polish.  Do not wear lotions, powders, or perfumes. You may wear deodorant.  Do not shave 48 hours prior to surgery. Men may shave face and neck.  Do not bring valuables to the hospital.    [facility] is not responsible for any belongings or valuables.               Contacts, dentures or bridgework may not be worn into surgery.  Leave your suitcase in the car. After surgery it may be brought to your room.  For patients admitted to the hospital, discharge time is determined by your                treatment team.   Patients discharged the day of surgery will not be allowed to drive home.   Please read over the following fact sheets that you were given:   Surgical Site Infection Prevention / SPIROMETRY  _X___ Take these medicines the morning of surgery with A SIP OF WATER:    1.OMEPRAZOLE  2. VENLAFAXINE  3.   4.  5.  6.  ____ Fleet Enema (as directed)   ____ Use CHG Soap as directed  ____ Use inhalers on the day of surgery  _X___ Stop metformin 2 days prior to surgery    ____ Take 1/2 of usual insulin dose the night before  surgery and none on the morning of surgery.   ____ Stop Coumadin/Plavix/aspirin on  __X__ Stop Anti-inflammatories on   STOP IBUPROFEN UNTIL AFTER SURGERY   ____ Stop supplements until after surgery.    PRACTICE WITH INCENTIVE SPIROMETRY AND BRING AM OF SURGERY   ____ Bring C-Pap to the hospital.

## 2016-12-15 ENCOUNTER — Encounter
Admission: RE | Admit: 2016-12-15 | Discharge: 2016-12-15 | Disposition: A | Discharge status: Home / Self Care | Payer: 59 | Source: Ambulatory Visit | Attending: Obstetrics & Gynecology | Admitting: Obstetrics & Gynecology

## 2016-12-15 DIAGNOSIS — N921 Excessive and frequent menstruation with irregular cycle: Secondary | ICD-10-CM | POA: Diagnosis not present

## 2016-12-15 DIAGNOSIS — I10 Essential (primary) hypertension: Secondary | ICD-10-CM | POA: Diagnosis not present

## 2016-12-15 DIAGNOSIS — Z01818 Encounter for other preprocedural examination: Secondary | ICD-10-CM | POA: Diagnosis not present

## 2016-12-15 LAB — CBC
HCT: 39.2 % (ref 35.0–47.0)
Hemoglobin: 13.5 g/dL (ref 12.0–16.0)
MCH: 31.4 pg (ref 26.0–34.0)
MCHC: 34.5 g/dL (ref 32.0–36.0)
MCV: 91.1 fL (ref 80.0–100.0)
PLATELETS: 248 10*3/uL (ref 150–440)
RBC: 4.3 MIL/uL (ref 3.80–5.20)
RDW: 13.9 % (ref 11.5–14.5)
WBC: 6.5 10*3/uL (ref 3.6–11.0)

## 2016-12-15 LAB — TYPE AND SCREEN
ABO/RH(D): A POS
Antibody Screen: NEGATIVE

## 2016-12-15 LAB — BASIC METABOLIC PANEL
ANION GAP: 12 (ref 5–15)
BUN: 11 mg/dL (ref 6–20)
CALCIUM: 9.1 mg/dL (ref 8.9–10.3)
CO2: 24 mmol/L (ref 22–32)
CREATININE: 0.66 mg/dL (ref 0.44–1.00)
Chloride: 102 mmol/L (ref 101–111)
GFR calc non Af Amer: >60 mL/min (ref >60)
Glucose, Bld: 179 mg/dL — ABNORMAL HIGH (ref 65–99)
Potassium: 3.6 mmol/L (ref 3.5–5.1)
SODIUM: 138 mmol/L (ref 135–145)

## 2016-12-22 DIAGNOSIS — I10 Essential (primary) hypertension: Secondary | ICD-10-CM | POA: Diagnosis not present

## 2016-12-23 ENCOUNTER — Ambulatory Visit: Payer: Commercial Managed Care - HMO | Admitting: Certified Registered"

## 2016-12-23 ENCOUNTER — Ambulatory Visit
Admission: RE | Admit: 2016-12-23 | Discharge: 2016-12-23 | Disposition: A | Discharge status: Home / Self Care | Payer: Commercial Managed Care - HMO | Source: Ambulatory Visit | Attending: Obstetrics & Gynecology | Admitting: Obstetrics & Gynecology

## 2016-12-23 ENCOUNTER — Encounter
Admission: RE | Disposition: A | Discharge status: Home / Self Care | Payer: Self-pay | Source: Ambulatory Visit | Attending: Obstetrics & Gynecology

## 2016-12-23 ENCOUNTER — Encounter: Payer: Self-pay | Admitting: *Deleted

## 2016-12-23 DIAGNOSIS — R109 Unspecified abdominal pain: Secondary | ICD-10-CM | POA: Diagnosis present

## 2016-12-23 DIAGNOSIS — R102 Pelvic and perineal pain: Secondary | ICD-10-CM | POA: Insufficient documentation

## 2016-12-23 DIAGNOSIS — N838 Other noninflammatory disorders of ovary, fallopian tube and broad ligament: Secondary | ICD-10-CM | POA: Insufficient documentation

## 2016-12-23 DIAGNOSIS — N920 Excessive and frequent menstruation with regular cycle: Secondary | ICD-10-CM | POA: Diagnosis present

## 2016-12-23 DIAGNOSIS — E119 Type 2 diabetes mellitus without complications: Secondary | ICD-10-CM | POA: Diagnosis not present

## 2016-12-23 DIAGNOSIS — N83202 Unspecified ovarian cyst, left side: Secondary | ICD-10-CM | POA: Diagnosis not present

## 2016-12-23 DIAGNOSIS — F17210 Nicotine dependence, cigarettes, uncomplicated: Secondary | ICD-10-CM | POA: Diagnosis not present

## 2016-12-23 DIAGNOSIS — R1032 Left lower quadrant pain: Secondary | ICD-10-CM | POA: Diagnosis not present

## 2016-12-23 DIAGNOSIS — F329 Major depressive disorder, single episode, unspecified: Secondary | ICD-10-CM | POA: Diagnosis not present

## 2016-12-23 DIAGNOSIS — Z7984 Long term (current) use of oral hypoglycemic drugs: Secondary | ICD-10-CM | POA: Diagnosis not present

## 2016-12-23 DIAGNOSIS — K660 Peritoneal adhesions (postprocedural) (postinfection): Secondary | ICD-10-CM | POA: Diagnosis not present

## 2016-12-23 DIAGNOSIS — I10 Essential (primary) hypertension: Secondary | ICD-10-CM | POA: Diagnosis not present

## 2016-12-23 DIAGNOSIS — N921 Excessive and frequent menstruation with irregular cycle: Secondary | ICD-10-CM | POA: Diagnosis not present

## 2016-12-23 DIAGNOSIS — Z79899 Other long term (current) drug therapy: Secondary | ICD-10-CM | POA: Diagnosis not present

## 2016-12-23 HISTORY — PX: DILITATION & CURRETTAGE/HYSTROSCOPY WITH NOVASURE ABLATION: SHX5568

## 2016-12-23 HISTORY — PX: LAPAROSCOPIC SALPINGO OOPHERECTOMY: SHX5927

## 2016-12-23 LAB — GLUCOSE, CAPILLARY
GLUCOSE-CAPILLARY: 172 mg/dL — AB (ref 65–99)
GLUCOSE-CAPILLARY: 146 mg/dL — AB (ref 65–99)

## 2016-12-23 LAB — ABO/RH: ABO/RH(D): A POS

## 2016-12-23 LAB — POCT PREGNANCY, URINE: PREG TEST UR: NEGATIVE

## 2016-12-23 SURGERY — SALPINGO-OOPHORECTOMY, LAPAROSCOPIC
Anesthesia: General

## 2016-12-23 MED ORDER — KETOROLAC TROMETHAMINE 30 MG/ML IJ SOLN
INTRAMUSCULAR | Status: AC
  Filled 2016-12-23: qty 1

## 2016-12-23 MED ORDER — PROPOFOL 10 MG/ML IV BOLUS
INTRAVENOUS | Status: DC | PRN
  Administered 2016-12-23: 160 mg via INTRAVENOUS

## 2016-12-23 MED ORDER — MIDAZOLAM HCL 2 MG/2ML IJ SOLN
INTRAMUSCULAR | Status: AC
  Filled 2016-12-23: qty 2

## 2016-12-23 MED ORDER — SODIUM CHLORIDE 0.9 % IJ SOLN
INTRAMUSCULAR | Status: AC
  Filled 2016-12-23: qty 10

## 2016-12-23 MED ORDER — PROPOFOL 10 MG/ML IV BOLUS
INTRAVENOUS | Status: AC
  Filled 2016-12-23: qty 20

## 2016-12-23 MED ORDER — PROMETHAZINE HCL 25 MG/ML IJ SOLN
INTRAMUSCULAR | Status: AC
  Filled 2016-12-23: qty 1

## 2016-12-23 MED ORDER — MORPHINE SULFATE (PF) 4 MG/ML IV SOLN: 1.0000 mg | INTRAVENOUS | Status: DC | PRN

## 2016-12-23 MED ORDER — ACETAMINOPHEN 325 MG PO TABS: 650.0000 mg | ORAL_TABLET | ORAL | Status: DC | PRN

## 2016-12-23 MED ORDER — PROMETHAZINE HCL 25 MG/ML IJ SOLN
6.2500 mg | INTRAMUSCULAR | Status: AC | PRN
  Administered 2016-12-23 (×2): 12.5 mg via INTRAVENOUS

## 2016-12-23 MED ORDER — LACTATED RINGERS IV SOLN: INTRAVENOUS | Status: DC

## 2016-12-23 MED ORDER — GLYCOPYRROLATE 0.2 MG/ML IJ SOLN
INTRAMUSCULAR | Status: DC | PRN
  Administered 2016-12-23: 0.4 mg via INTRAVENOUS

## 2016-12-23 MED ORDER — FENTANYL CITRATE (PF) 100 MCG/2ML IJ SOLN
INTRAMUSCULAR | Status: DC | PRN
  Administered 2016-12-23 (×3): 50 ug via INTRAVENOUS

## 2016-12-23 MED ORDER — ONDANSETRON 4 MG PO TBDP: 4.0000 mg | ORAL_TABLET | Freq: Four times a day (QID) | ORAL | 0 refills | Status: DC | PRN

## 2016-12-23 MED ORDER — GLYCOPYRROLATE 0.2 MG/ML IJ SOLN
INTRAMUSCULAR | Status: AC
  Filled 2016-12-23: qty 1

## 2016-12-23 MED ORDER — MIDAZOLAM HCL 2 MG/2ML IJ SOLN
INTRAMUSCULAR | Status: DC | PRN
Start: 2016-12-23 — End: 2016-12-23
  Administered 2016-12-23: 2 mg via INTRAVENOUS

## 2016-12-23 MED ORDER — DEXAMETHASONE SODIUM PHOSPHATE 10 MG/ML IJ SOLN
INTRAMUSCULAR | Status: DC | PRN
  Administered 2016-12-23: 10 mg via INTRAVENOUS

## 2016-12-23 MED ORDER — OXYCODONE-ACETAMINOPHEN 5-325 MG PO TABS
1.0000 | ORAL_TABLET | ORAL | Status: DC | PRN
  Administered 2016-12-23: 1 via ORAL

## 2016-12-23 MED ORDER — OXYCODONE-ACETAMINOPHEN 5-325 MG PO TABS: 1.0000 | ORAL_TABLET | ORAL | 0 refills | Status: DC | PRN

## 2016-12-23 MED ORDER — FENTANYL CITRATE (PF) 100 MCG/2ML IJ SOLN
INTRAMUSCULAR | Status: AC
  Filled 2016-12-23: qty 2

## 2016-12-23 MED ORDER — KETOROLAC TROMETHAMINE 30 MG/ML IJ SOLN
INTRAMUSCULAR | Status: DC | PRN
  Administered 2016-12-23: 30 mg via INTRAVENOUS

## 2016-12-23 MED ORDER — LIDOCAINE HCL (CARDIAC) 20 MG/ML IV SOLN
INTRAVENOUS | Status: DC | PRN
  Administered 2016-12-23: 80 mg via INTRAVENOUS

## 2016-12-23 MED ORDER — ACETAMINOPHEN 650 MG RE SUPP
650.0000 mg | RECTAL | Status: DC | PRN
  Filled 2016-12-23: qty 1

## 2016-12-23 MED ORDER — BUPIVACAINE HCL (PF) 0.5 % IJ SOLN
INTRAMUSCULAR | Status: DC | PRN
  Administered 2016-12-23: 16 mL

## 2016-12-23 MED ORDER — KETOROLAC TROMETHAMINE 30 MG/ML IJ SOLN
30.0000 mg | Freq: Four times a day (QID) | INTRAMUSCULAR | Status: DC
  Filled 2016-12-23: qty 1

## 2016-12-23 MED ORDER — NEOSTIGMINE METHYLSULFATE 10 MG/10ML IV SOLN
INTRAVENOUS | Status: DC | PRN
  Administered 2016-12-23: 3 mg via INTRAVENOUS

## 2016-12-23 MED ORDER — PROMETHAZINE HCL 25 MG/ML IJ SOLN
INTRAMUSCULAR | Status: AC
  Administered 2016-12-23: 12.5 mg via INTRAVENOUS
  Filled 2016-12-23: qty 1

## 2016-12-23 MED ORDER — OXYCODONE-ACETAMINOPHEN 5-325 MG PO TABS
ORAL_TABLET | ORAL | Status: AC
  Administered 2016-12-23: 1 via ORAL
  Filled 2016-12-23: qty 1

## 2016-12-23 MED ORDER — ONDANSETRON HCL 4 MG/2ML IJ SOLN
INTRAMUSCULAR | Status: DC | PRN
  Administered 2016-12-23: 4 mg via INTRAVENOUS

## 2016-12-23 MED ORDER — PHENYLEPHRINE HCL 10 MG/ML IJ SOLN
INTRAMUSCULAR | Status: DC | PRN
  Administered 2016-12-23 (×2): 50 ug via INTRAVENOUS

## 2016-12-23 MED ORDER — SODIUM CHLORIDE 0.9 % IV SOLN
INTRAVENOUS | Status: DC
  Administered 2016-12-23 (×2): via INTRAVENOUS

## 2016-12-23 MED ORDER — FENTANYL CITRATE (PF) 100 MCG/2ML IJ SOLN
25.0000 ug | INTRAMUSCULAR | Status: DC | PRN
  Administered 2016-12-23: 25 ug via INTRAVENOUS

## 2016-12-23 MED ORDER — ONDANSETRON HCL 4 MG/2ML IJ SOLN
INTRAMUSCULAR | Status: AC
  Filled 2016-12-23: qty 2

## 2016-12-23 MED ORDER — NEOSTIGMINE METHYLSULFATE 10 MG/10ML IV SOLN
INTRAVENOUS | Status: AC
  Filled 2016-12-23: qty 1

## 2016-12-23 MED ORDER — DEXAMETHASONE SODIUM PHOSPHATE 10 MG/ML IJ SOLN
INTRAMUSCULAR | Status: AC
  Filled 2016-12-23: qty 1

## 2016-12-23 MED ORDER — ROCURONIUM BROMIDE 100 MG/10ML IV SOLN
INTRAVENOUS | Status: DC | PRN
  Administered 2016-12-23: 5 mg via INTRAVENOUS
  Administered 2016-12-23: 40 mg via INTRAVENOUS

## 2016-12-23 MED ORDER — FENTANYL CITRATE (PF) 100 MCG/2ML IJ SOLN
INTRAMUSCULAR | Status: AC
  Administered 2016-12-23: 25 ug via INTRAVENOUS
  Filled 2016-12-23: qty 2

## 2016-12-23 SURGICAL SUPPLY — 46 items
ADH SKN CLS APL DERMABOND .7 (GAUZE/BANDAGES/DRESSINGS) ×2
BAG SPEC RTRVL LRG 6X4 10 (ENDOMECHANICALS) ×2
BLADE SURG SZ11 CARB STEEL (BLADE) ×3 IMPLANT
CANISTER SUCT 1200ML W/VALVE (MISCELLANEOUS) ×3 IMPLANT
CANISTER SUCT 3000ML PPV (MISCELLANEOUS) ×3 IMPLANT
CATH ROBINSON RED A/P 16FR (CATHETERS) ×3 IMPLANT
CHLORAPREP W/TINT 26ML (MISCELLANEOUS) ×3 IMPLANT
DERMABOND ADVANCED (GAUZE/BANDAGES/DRESSINGS) ×1
DERMABOND ADVANCED .7 DNX12 (GAUZE/BANDAGES/DRESSINGS) ×2 IMPLANT
DRSG TELFA 4X3 1S NADH ST (GAUZE/BANDAGES/DRESSINGS) IMPLANT
GAUZE SPONGE NON-WVN 2X2 STRL (MISCELLANEOUS) IMPLANT
GLOVE BIO SURGEON STRL SZ8 (GLOVE) ×5 IMPLANT
GLOVE INDICATOR 8.0 STRL GRN (GLOVE) ×5 IMPLANT
GOWN STRL REUS W/ TWL LRG LVL3 (GOWN DISPOSABLE) ×2 IMPLANT
GOWN STRL REUS W/ TWL XL LVL3 (GOWN DISPOSABLE) ×2 IMPLANT
GOWN STRL REUS W/TWL LRG LVL3 (GOWN DISPOSABLE) ×3
GOWN STRL REUS W/TWL XL LVL3 (GOWN DISPOSABLE) ×3
GRASPER SUT TROCAR 14GX15 (MISCELLANEOUS) ×3 IMPLANT
IRRIGATION STRYKERFLOW (MISCELLANEOUS) IMPLANT
IRRIGATOR STRYKERFLOW (MISCELLANEOUS)
IV LACTATED RINGERS 1000ML (IV SOLUTION) ×3 IMPLANT
KIT PINK PAD W/HEAD ARE REST (MISCELLANEOUS) ×3
KIT PINK PAD W/HEAD ARM REST (MISCELLANEOUS) ×2 IMPLANT
LABEL OR SOLS (LABEL) ×3 IMPLANT
NEEDLE VERESS 14GA 120MM (NEEDLE) ×3 IMPLANT
NOVASURE ENDOMETRIAL ABLATION (MISCELLANEOUS) ×2 IMPLANT
NS IRRIG 500ML POUR BTL (IV SOLUTION) ×3 IMPLANT
PACK DNC HYST (MISCELLANEOUS) ×3 IMPLANT
PACK GYN LAPAROSCOPIC (MISCELLANEOUS) ×3 IMPLANT
PAD OB MATERNITY 4.3X12.25 (PERSONAL CARE ITEMS) ×3 IMPLANT
PAD PREP 24X41 OB/GYN DISP (PERSONAL CARE ITEMS) ×3 IMPLANT
POUCH SPECIMEN RETRIEVAL 10MM (ENDOMECHANICALS) ×1 IMPLANT
SCISSORS METZENBAUM CVD 33 (INSTRUMENTS) ×2 IMPLANT
SET CYSTO W/LG BORE CLAMP LF (SET/KITS/TRAYS/PACK) ×1 IMPLANT
SHEARS HARMONIC ACE PLUS 36CM (ENDOMECHANICALS) ×1 IMPLANT
SLEEVE ENDOPATH XCEL 5M (ENDOMECHANICALS) IMPLANT
SPONGE VERSALON 2X2 STRL (MISCELLANEOUS)
STRAP SAFETY BODY (MISCELLANEOUS) ×3 IMPLANT
SUT VIC AB 0 CT1 36 (SUTURE) ×3 IMPLANT
SUT VIC AB 2-0 UR6 27 (SUTURE) IMPLANT
SUT VIC AB 4-0 PS2 18 (SUTURE) IMPLANT
SYRINGE 10CC LL (SYRINGE) ×3 IMPLANT
TROCAR ENDO BLADELESS 11MM (ENDOMECHANICALS) IMPLANT
TROCAR XCEL NON-BLD 5MMX100MML (ENDOMECHANICALS) ×3 IMPLANT
TUBING CONNECTING 10 (TUBING) ×3 IMPLANT
TUBING INSUFFLATOR HI FLOW (MISCELLANEOUS) ×3 IMPLANT

## 2016-12-23 NOTE — Op Note (Signed)
Operative Note   12/23/2016  PRE-OP DIAGNOSIS: Menorrhagia, Left Lower Quadrant Pain (Chronic pelvic pain)   POST-OP DIAGNOSIS: same   SURGEON: Barnett Applebaum, MD, FACOG   PROCEDURE: Procedure(s): LAPAROSCOPIC with LEFTSALPINGO OOPHORECTOMY DILATATION & CURETTAGE/HYSTEROSCOPY ENDOMETRIAL ABLATION  ANESTHESIA: General   ESTIMATED BLOOD LOSS: min   SPECIMENS: Left tube and ovary   FLUID DEFICIT: min   COMPLICATIONS: none   DISPOSITION: PACU - hemodynamically stable.   CONDITION: stable   FINDINGS: Exam under anesthesia revealed small, mobile small uterus with no masses and bilateral adnexa without masses or fullness. Hysteroscopy revealed no polyps or submucosal fibroids, otherwise grossly normal appearing uterine cavity with bilateral tubal ostia and normal appearing endocervical canal. Laparoscopy revealed normal tubes, ovaries, uterus.  Hypervascular left adnexa.  Evidence for essure on right tube, evidence for fallope rings on each tube; adhesions of omentum and colon to LLQ sidewall.   PROCEDURE IN DETAIL: After informed consent was obtained, the patient was taken to the operating room where anesthesia was obtained without difficulty. The patient was positioned in the dorsal lithotomy position in [place]. The patient's bladder was catheterized with an in and out foley catheter. The patient was examined under anesthesia, with the above noted findings. The weighted speculum was placed inside the patient's vagina, and the the anterior lip of the cervix was seen and grasped with the tenaculum.  The uterine cavity was sounded to 9cm and a Hulka Tenaculum was placed.  Attention is then turned to the abdomen where a veress needle is placed through an infraumbilical incision after Marcaine is used to anesthesize the skin.  Veress needle placement is confirmed using the hanging drop technique and the abdomen is then insufflated w CO2 gas.  A 27mm trocar is then placed under direct  visualization with the laparoscope and the above mentioned findings seen.  An additional 38mm  trocar is placed RLQ.  A suprapubic 11 mm trocar also placed.  The left fallopian tube and ovary is grasped and using the Harmonic scapel the Infundibulo Ligament with ovarian blood vessels are coagulated and transected, also the uterine ovarian blood vessels,  followed by dissection free of the tube and ovary.  It is then removed through an endopouch.  Hemostasis assured and ureter observed to be free and clear.    Gas is expelled and trocars removed with skin closure using Dermabond.  Return to vaginal approach.  The cervix was progressively dilated to 1mm . The 30 degree hysteroscope was introduced, with LR fluid used to distend the intrauterine cavity, with the above noted findings. Hysteroscope removed.    The endometrial ablation device was then placed without difficulty. Measurements were obtained. Patient was noted to have a uterine length of 9cm, a cervical length of 5 cm. The device is first tested and after confirmation the procedures performed. Length of procedure was 120 seconds.  The  device is then removed and repeat hysteroscopy reveals an appropriate lining of the uterus and no perforation or injury. Hysteroscope is removed with minimal discrepancy of fluid.    Tenaculum was removed with excellent hemostasis noted. She was then taken out of dorsal lithotomy. Minimal discrepancy in fluid was noted. No bleeding.  The patient tolerated the procedure well. Sponge, lap and needle counts were correct x2. The patient was taken to recovery room in excellent condition.  Barnett Applebaum, MD, [facility], [facility] 12/23/2016  11:51 AM

## 2016-12-23 NOTE — Anesthesia Post-op Follow-up Note (Signed)
Anesthesia QCDR form completed.        

## 2016-12-23 NOTE — Discharge Instructions (Signed)
AMBULATORY SURGERY  DISCHARGE INSTRUCTIONS   1) The drugs that you were given will stay in your system until tomorrow so for the next 24 hours you should not:  A) Drive an automobile B) Make any legal decisions C) Drink any alcoholic beverage   2) You may resume regular meals tomorrow.  Today it is better to start with liquids and gradually work up to solid foods.  You may eat anything you prefer, but it is better to start with liquids, then soup and crackers, and gradually work up to solid foods.   3) Please notify your doctor immediately if you have any unusual bleeding, trouble breathing, redness and pain at the surgery site, drainage, fever, or pain not relieved by medication. 4)   5) Your post-operative visit with Dr.                                     is: Date:                        Time:    Please call to schedule your post-operative visit.  6) Additional Instructions:      General Gynecological Post-Operative Instructions You may expect to feel dizzy, weak, and drowsy for as long as 24 hours after receiving the medicine that made you sleep (anesthetic).  Do not drive a car, ride a bicycle, participate in physical activities, or take public transportation until you are done taking narcotic pain medicines or as directed by your doctor.  Do not drink alcohol or take tranquilizers.  Do not take medicine that has not been prescribed by your doctor.  Do not sign important papers or make important decisions while on narcotic pain medicines.  Have a responsible person with you.  CARE OF INCISION  Keep incision clean and dry. Take showers instead of baths until your doctor gives you permission to take baths.  Avoid heavy lifting (more than 10 pounds/4.5 kilograms), pushing, or pulling.  Avoid activities that may risk injury to your surgical site.  No sexual intercourse or placement of anything in the vagina for 2 weeks or as instructed by your doctor. If you have tubes  coming from the wound site, check with your doctor regarding appropriate care of the tubes. Only take prescription or over-the-counter medicines  for pain, discomfort, or fever as directed by your doctor. Do not take aspirin. It can make you bleed. Take medicines (antibiotics) that kill germs if they are prescribed for you.  Call the office or go to the ER if:  You feel sick to your stomach (nauseous) and you start to throw up (vomit).  You have trouble eating or drinking.  You have an oral temperature above 101.  You have constipation that is not helped by adjusting diet or increasing fluid intake. Pain medicines are a common cause of constipation.  You have any other concerns. SEEK IMMEDIATE MEDICAL CARE IF:  You have persistent dizziness.  You have difficulty breathing or a congested sounding (croupy) cough.  You have an oral temperature above 102.5, not controlled by medicine.  There is increasing pain or tenderness near or in the surgical site.

## 2016-12-23 NOTE — Transfer of Care (Signed)
Immediate Anesthesia Transfer of Care Note  Patient: Alyssa Warren  Procedure(s) Performed: Procedure(s): LAPAROSCOPIC SALPINGO OOPHORECTOMY (Left) DILATATION & CURETTAGE/HYSTEROSCOPY ENDOMETRIAL ABLATION-MINERVA (N/A)  Patient Location: PACU  Anesthesia Type:General  Level of Consciousness: awake, alert , oriented and patient cooperative  Airway & Oxygen Therapy: Patient Spontanous Breathing and Patient connected to nasal cannula oxygen  Post-op Assessment: Report given to RN, Post -op Vital signs reviewed and stable and Patient moving all extremities  Post vital signs: Reviewed and stable  Last Vitals:  Vitals:   12/23/16 0854  BP: (!) 144/86  Pulse: 81  Resp: 16  Temp: 36.6 C  SpO2: 97%    Last Pain:  Vitals:   12/23/16 0854  TempSrc: Oral  PainSc: 5       Patients Stated Pain Goal: 2 (32/54/98 2641)  Complications: No apparent anesthesia complications

## 2016-12-23 NOTE — H&P (Signed)
History and Physical Interval Note:  12/23/2016 9:19 AM  Alyssa Warren  has presented today for surgery, with the diagnosis of LEFT LOWER QUADRANT PAIN,MENORRHAGIA  The various methods of treatment have been discussed with the patient and family. After consideration of risks, benefits and other options for treatment, the patient has consented to  Procedure(s): LAPAROSCOPIC SALPINGO OOPHORECTOMY (Left) DILATATION & CURETTAGE/HYSTEROSCOPY ENDOMETRIAL ABLATION-MINERVA (N/A) as a surgical intervention .  The patient's history has been reviewed, patient examined, no change in status, stable for surgery.  Pt has the following beta blocker history-  Not taking Beta Blocker.  I have reviewed the patient's chart and labs.  Questions were answered to the patient's satisfaction.       Hoyt Koch

## 2016-12-23 NOTE — Anesthesia Preprocedure Evaluation (Signed)
Anesthesia Evaluation  Patient identified by MRN, date of birth, ID band Patient awake    Reviewed: Allergy & Precautions, H&P , NPO status , Patient's Chart, lab work & pertinent test results, reviewed documented beta blocker date and time   History of Anesthesia Complications Negative for: history of anesthetic complications  Airway Mallampati: I  TM Distance: >3 FB Neck ROM: full    Dental  (+) Caps, Dental Advidsory Given, Teeth Intact   Pulmonary neg pulmonary ROS, former smoker,           Cardiovascular Exercise Tolerance: Good hypertension, (-) angina(-) CAD, (-) Past MI, (-) Cardiac Stents and (-) CABG (-) dysrhythmias + Valvular Problems/Murmurs AS      Neuro/Psych PSYCHIATRIC DISORDERS (Depression) negative neurological ROS     GI/Hepatic negative GI ROS, Neg liver ROS,   Endo/Other  diabetes, Type 2, Oral Hypoglycemic Agents  Renal/GU negative Renal ROS  negative genitourinary   Musculoskeletal   Abdominal   Peds  Hematology negative hematology ROS (+)   Anesthesia Other Findings Past Medical History: No date: Allergy No date: Diabetes mellitus without complication (HCC) No date: Hypertension   Reproductive/Obstetrics negative OB ROS                             Anesthesia Physical Anesthesia Plan  ASA: II  Anesthesia Plan: General   Post-op Pain Management:    Induction: Intravenous  PONV Risk Score and Plan: 3 and Ondansetron, Dexamethasone and Midazolam  Airway Management Planned: Oral ETT  Additional Equipment:   Intra-op Plan:   Post-operative Plan: Extubation in OR  Informed Consent: I have reviewed the patients History and Physical, chart, labs and discussed the procedure including the risks, benefits and alternatives for the proposed anesthesia with the patient or authorized representative who has indicated his/her understanding and acceptance.   Dental  Advisory Given  Plan Discussed with: Anesthesiologist, CRNA and Surgeon  Anesthesia Plan Comments:         Anesthesia Quick Evaluation

## 2016-12-23 NOTE — Anesthesia Procedure Notes (Signed)
Procedure Name: Intubation Performed by: Demetrius Charity Pre-anesthesia Checklist: Patient identified, Patient being monitored, Timeout performed, Emergency Drugs available and Suction available Patient Re-evaluated:Patient Re-evaluated prior to induction Oxygen Delivery Method: Circle system utilized Preoxygenation: Pre-oxygenation with 100% oxygen Induction Type: IV induction Ventilation: Mask ventilation without difficulty Laryngoscope Size: Mac and 3 Grade View: Grade I Tube type: Oral Tube size: 6.5 mm Number of attempts: 1 Airway Equipment and Method: Stylet Placement Confirmation: ETT inserted through vocal cords under direct vision,  positive ETCO2 and breath sounds checked- equal and bilateral Secured at: 21 cm Tube secured with: Tape Dental Injury: Teeth and Oropharynx as per pre-operative assessment

## 2016-12-23 NOTE — Progress Notes (Signed)
On arrival to pacu, patient kicking covers off of herself And stating she was hot and nauseated.  Reassured Patient and placed cool sheet over here.  Medcidine To be given for nausea.

## 2016-12-24 ENCOUNTER — Telehealth: Payer: Self-pay | Admitting: *Deleted

## 2016-12-24 LAB — SURGICAL PATHOLOGY

## 2016-12-24 MED ORDER — VENLAFAXINE HCL ER 37.5 MG PO CP24: 37.5000 mg | ORAL_CAPSULE | Freq: Every day | ORAL | 0 refills | Status: DC

## 2016-12-24 NOTE — Telephone Encounter (Signed)
Okay to refill to local pharmacy for 2 weeks supply. Directions as pt taking.

## 2016-12-24 NOTE — Telephone Encounter (Signed)
Rx sent in to [facility] [address] in [place], as instructed by Dr. Diona Browner.  [name] notified by telephone.

## 2016-12-24 NOTE — Telephone Encounter (Signed)
Patient left a voicemail stating that she forgot to order her Effexor from her mail order pharmacy and only has one left for today. Patient requested that you send maybe a weeks worth to her local pharmacy to last her until she gets the one from her mail order. See note that patient is taking differently.  [facility]- [facility] [address]

## 2016-12-24 NOTE — Anesthesia Postprocedure Evaluation (Signed)
Anesthesia Post Note  Patient: Alyssa Warren  Procedure(s) Performed: Procedure(s) (LRB): LAPAROSCOPIC SALPINGO OOPHORECTOMY (Left) DILATATION & CURETTAGE/HYSTEROSCOPY ENDOMETRIAL ABLATION-MINERVA (N/A)  Patient location during evaluation: PACU Anesthesia Type: General Level of consciousness: awake and alert Pain management: pain level controlled Vital Signs Assessment: post-procedure vital signs reviewed and stable Respiratory status: spontaneous breathing, nonlabored ventilation, respiratory function stable and patient connected to nasal cannula oxygen Cardiovascular status: blood pressure returned to baseline and stable Postop Assessment: no signs of nausea or vomiting Anesthetic complications: no     Last Vitals:  Vitals:   12/23/16 1446 12/23/16 1501  BP: 116/81 120/85  Pulse: 83 78  Resp:    Temp:  36.9 C  SpO2: 95% 98%    Last Pain:  Vitals:   12/24/16 0847  TempSrc:   PainSc: 5                  Martha Clan

## 2017-01-06 ENCOUNTER — Encounter: Payer: Self-pay | Admitting: Obstetrics & Gynecology

## 2017-01-06 ENCOUNTER — Ambulatory Visit (INDEPENDENT_AMBULATORY_CARE_PROVIDER_SITE_OTHER): Payer: 59 | Admitting: Obstetrics & Gynecology

## 2017-01-06 VITALS — BP 130/90 | HR 96 | Ht 66.0 in | Wt 173.0 lb

## 2017-01-06 DIAGNOSIS — N920 Excessive and frequent menstruation with regular cycle: Secondary | ICD-10-CM

## 2017-01-06 DIAGNOSIS — R109 Unspecified abdominal pain: Secondary | ICD-10-CM

## 2017-01-06 NOTE — Progress Notes (Signed)
  Postoperative Follow-up Patient presents post op from Lap LSO and Hyst Ablation for abnormal uterine bleeding and pelvic pain, 2 weeks ago.  Subjective: Patient reports marked improvement in her preop symptoms. Eating a regular diet without difficulty. The patient is not having any pain.  Activity: normal activities of daily living. Patient reports vaginal sx's of Vaginal discharge  Objective: BP 130/90   Pulse 96   Ht 5\' 6"  (1.676 m)   Wt 173 lb (78.5 kg)   BMI 27.92 kg/m  Physical Exam  Constitutional: She is oriented to person, place, and time. She appears well-developed and well-nourished. No distress.  Cardiovascular: Normal rate.   Pulmonary/Chest: Effort normal.  Abdominal: Soft. She exhibits no distension. There is no tenderness.  Incision Healing Well   Musculoskeletal: Normal range of motion.  Neurological: She is alert and oriented to person, place, and time. No cranial nerve deficit.  Skin: Skin is warm and dry.  Psychiatric: She has a normal mood and affect.    Assessment: s/p :  left oophorectomy, operative hysteroscopy and endometrial ablation stable  Plan: Patient has done well after surgery with no apparent complications.  I have discussed the post-operative course to date, and the expected progress moving forward.  The patient understands what complications to be concerned about.  I will see the patient in routine follow up, or sooner if needed.    Activity plan: No restriction.  Hoyt Koch 01/06/2017, 8:22 AM

## 2017-01-14 ENCOUNTER — Encounter: Payer: Self-pay | Admitting: Obstetrics & Gynecology

## 2017-01-14 ENCOUNTER — Ambulatory Visit (INDEPENDENT_AMBULATORY_CARE_PROVIDER_SITE_OTHER): Payer: 59 | Admitting: Obstetrics & Gynecology

## 2017-01-14 VITALS — BP 120/90 | HR 100 | Ht 66.0 in | Wt 175.0 lb

## 2017-01-14 DIAGNOSIS — L0390 Cellulitis, unspecified: Secondary | ICD-10-CM | POA: Diagnosis not present

## 2017-01-14 MED ORDER — SULFAMETHOXAZOLE-TRIMETHOPRIM 800-160 MG PO TABS: 1.0000 | ORAL_TABLET | Freq: Two times a day (BID) | ORAL | 1 refills | Status: DC

## 2017-01-14 NOTE — Progress Notes (Signed)
  Redness at prior scar Patient presents to the clinic several weeks status post laparoscopy for adnexal mass. She was doing well for several weeks but 2 days ago noticed redness at lower incision site.  No drainage. Mildly warm and T.  PMHx: She  has a past medical history of Allergy; Diabetes mellitus without complication ([place]); and Hypertension. Also,  has a past surgical history that includes Tubal ligation; plantar fasciitis release (Bilateral); Knee surgery (Left); cyst removed from ovary; Breast biopsy (Right, 02/2012); Laparoscopic salpingo oophorectomy (Left, 12/23/2016); and Dilatation & currettage/hysteroscopy with novasure ablation (N/A, 12/23/2016)., family history includes Breast cancer in her maternal aunt and paternal aunt.,  reports that she quit smoking about 3 weeks ago. Her smoking use included Cigarettes. She smoked 0.30 packs per day. She has never used smokeless tobacco. She reports that she does not drink alcohol or use drugs.  She has a current medication list which includes the following prescription(s): one touch ultra system kit, glipizide, glucose blood, ibuprofen, losartan-hydrochlorothiazide, metformin, omeprazole, ondansetron, ondansetron, onetouch delica lancets 23N, oxycodone-acetaminophen, sulfamethoxazole-trimethoprim, valacyclovir, and venlafaxine xr. Also, is allergic to penicillins; keflex [cephalexin]; and methyldopa.  Review of Systems  Constitutional: Negative for chills, fever and malaise/fatigue.  HENT: Negative for congestion, sinus pain and sore throat.   Eyes: Negative for blurred vision and pain.  Respiratory: Negative for cough and wheezing.   Cardiovascular: Negative for chest pain and leg swelling.  Gastrointestinal: Negative for abdominal pain, constipation, diarrhea, heartburn, nausea and vomiting.  Genitourinary: Negative for dysuria, frequency, hematuria and urgency.  Musculoskeletal: Negative for back pain, joint pain, myalgias and neck pain.    Skin: Negative for itching and rash.  Neurological: Negative for dizziness, tremors and weakness.  Endo/Heme/Allergies: Does not bruise/bleed easily.  Psychiatric/Behavioral: Negative for depression. The patient is not nervous/anxious and does not have insomnia.     Objective: BP 120/90   Pulse 100   Ht 5' 6" (1.676 m)   Wt 175 lb (79.4 kg)   BMI 28.25 kg/m  Physical Exam  Constitutional: She is oriented to person, place, and time. She appears well-developed and well-nourished. No distress.  Cardiovascular: Normal rate.   Pulmonary/Chest: Effort normal.  Abdominal: Soft. She exhibits no distension. There is no tenderness.  Pink erythema surrounding suprapubic incision, no drainage, mildly T   Musculoskeletal: Normal range of motion.  Neurological: She is alert and oriented to person, place, and time. No cranial nerve deficit.  Skin: Skin is warm and dry.  Psychiatric: She has a normal mood and affect.   ASSESSMENT/PLAN:    Problem List Items Addressed This Visit      Musculoskeletal and Integument   Cellulitis of skin - Primary    Bactrim ABX Monitor for fever or drainage  Barnett Applebaum, MD, [facility], [facility] 01/14/2017  2:59 PM

## 2017-01-14 NOTE — Patient Instructions (Signed)
Sulfamethoxazole; Trimethoprim, SMX-TMP tablets What is this medicine? SULFAMETHOXAZOLE; TRIMETHOPRIM or SMX-TMP (suhl fuh meth OK suh zohl; trye METH oh prim) is a combination of a sulfonamide antibiotic and a second antibiotic, trimethoprim. It is used to treat or prevent certain kinds of bacterial infections. It will not work for colds, flu, or other viral infections. This medicine may be used for other purposes; ask your health care provider or pharmacist if you have questions. COMMON BRAND NAME(S): Bacter-Aid DS, Bactrim, Bactrim DS, Septra, Septra DS What should I tell my health care provider before I take this medicine? They need to know if you have any of these conditions: -anemia -asthma -being treated with anticonvulsants -if you frequently drink alcohol containing drinks -kidney disease -liver disease -low level of folic acid or glucose-6-phosphate dehydrogenase -poor nutrition or malabsorption -porphyria -severe allergies -thyroid disorder -an unusual or allergic reaction to sulfamethoxazole, trimethoprim, sulfa drugs, other medicines, foods, dyes, or preservatives -pregnant or trying to get pregnant -breast-feeding How should I use this medicine? Take this medicine by mouth with a full glass of water. Follow the directions on the prescription label. Take your medicine at regular intervals. Do not take it more often than directed. Do not skip doses or stop your medicine early. Talk to your pediatrician regarding the use of this medicine in children. Special care may be needed. This medicine has been used in children as young as 2 months of age. Overdosage: If you think you have taken too much of this medicine contact a poison control center or emergency room at once. NOTE: This medicine is only for you. Do not share this medicine with others. What if I miss a dose? If you miss a dose, take it as soon as you can. If it is almost time for your next dose, take only that dose. Do  not take double or extra doses. What may interact with this medicine? Do not take this medicine with any of the following medications: -aminobenzoate potassium -dofetilide -metronidazole This medicine may also interact with the following medications: -ACE inhibitors like benazepril, enalapril, lisinopril, and ramipril -birth control pills -cyclosporine -digoxin -diuretics -indomethacin -medicines for diabetes -methenamine -methotrexate -phenytoin -potassium supplements -pyrimethamine -sulfinpyrazone -tricyclic antidepressants -warfarin This list may not describe all possible interactions. Give your health care provider a list of all the medicines, herbs, non-prescription drugs, or dietary supplements you use. Also tell them if you smoke, drink alcohol, or use illegal drugs. Some items may interact with your medicine. What should I watch for while using this medicine? Tell your doctor or health care professional if your symptoms do not improve. Drink several glasses of water a day to reduce the risk of kidney problems. Do not treat diarrhea with over the counter products. Contact your doctor if you have diarrhea that lasts more than 2 days or if it is severe and watery. This medicine can make you more sensitive to the sun. Keep out of the sun. If you cannot avoid being in the sun, wear protective clothing and use a sunscreen. Do not use sun lamps or tanning beds/booths. What side effects may I notice from receiving this medicine? Side effects that you should report to your doctor or health care professional as soon as possible: -allergic reactions like skin rash or hives, swelling of the face, lips, or tongue -breathing problems -fever or chills, sore throat -irregular heartbeat, chest pain -joint or muscle pain -pain or difficulty passing urine -red pinpoint spots on skin -redness, blistering, peeling or loosening of   the skin, including inside the mouth -unusual bleeding or  bruising -unusually weak or tired -yellowing of the eyes or skin Side effects that usually do not require medical attention (report to your doctor or health care professional if they continue or are bothersome): -diarrhea -dizziness -headache -loss of appetite -nausea, vomiting -nervousness This list may not describe all possible side effects. Call your doctor for medical advice about side effects. You may report side effects to FDA at 1-800-FDA-1088. Where should I keep my medicine? Keep out of the reach of children. Store at room temperature between 20 to 25 degrees C (68 to 77 degrees F). Protect from light. Throw away any unused medicine after the expiration date. NOTE: This sheet is a summary. It may not cover all possible information. If you have questions about this medicine, talk to your doctor, pharmacist, or health care provider.  2018 Elsevier/Gold Standard (2012-11-17 14:38:26)  

## 2017-01-14 NOTE — Telephone Encounter (Signed)
Needs to be seen briefly to check this out. Please call and bring in 150

## 2017-01-15 ENCOUNTER — Encounter: Payer: Self-pay | Admitting: Obstetrics & Gynecology

## 2017-03-07 ENCOUNTER — Encounter: Payer: Self-pay | Admitting: Internal Medicine

## 2017-03-07 ENCOUNTER — Telehealth: Payer: Self-pay

## 2017-03-07 ENCOUNTER — Ambulatory Visit: Payer: 59 | Admitting: Internal Medicine

## 2017-03-07 VITALS — BP 128/80 | HR 91 | Temp 98.0°F | Wt 174.0 lb

## 2017-03-07 DIAGNOSIS — Z23 Encounter for immunization: Secondary | ICD-10-CM

## 2017-03-07 DIAGNOSIS — M792 Neuralgia and neuritis, unspecified: Secondary | ICD-10-CM | POA: Diagnosis not present

## 2017-03-07 MED ORDER — VALACYCLOVIR HCL 1 G PO TABS: 1000.0000 mg | ORAL_TABLET | Freq: Two times a day (BID) | ORAL | 1 refills | Status: DC

## 2017-03-07 NOTE — Assessment & Plan Note (Signed)
Recurrent neuralgia like her shingles--but now in the 3rd location Very atypical No other herpes infections in past Probably worth a trial of valcyclovir since it helped in the past She is taking the gabapentin and that helps

## 2017-03-07 NOTE — Addendum Note (Signed)
Addended by: Pilar Grammes on: 03/07/2017 05:45 PM   Modules accepted: Orders

## 2017-03-07 NOTE — Progress Notes (Signed)
Subjective:    Patient ID: Alyssa Warren, female    DOB: 08/15/1970, 46 y.o.   MRN: [id number]  HPI  Thinks she has shingles Has similar pain to what she has had before with recurrent  Had shingles with rash when pregnant along right flank (~9 years ago) Then had pain in left V2 without rash -- ~3 years ago Saw ID-- titers indicated zoster  Rx with valtrex and gabapentin Resolved but has some skin changes chronically  Has used the valtrex again once since then---resolved the problem  Now having pain in left hip/pelvis Same burning/itching pain Started upper lateral left hip--- very sensitive  Into groin and upper buttock  Current Outpatient Medications on File Prior to Visit  Medication Sig Dispense Refill  . Blood Glucose Monitoring Suppl (ONE TOUCH ULTRA SYSTEM KIT) w/Device KIT Use to check blood sugar one to two times daily.  Dx: E11.9 1 each 0  . glipiZIDE (GLUCOTROL XL) 10 MG 24 hr tablet Take 1 tablet (10 mg total) by mouth daily with breakfast. (Patient taking differently: Take 10 mg by mouth at bedtime. ) 90 tablet 3  . glucose blood (ONE TOUCH ULTRA TEST) test strip Use to check blood sugar one to two times daily.  Dx: E11.9 200 each 3  . ibuprofen (ADVIL,MOTRIN) 200 MG tablet Take 200 mg by mouth every 8 (eight) hours as needed for mild pain or moderate pain.    Marland Kitchen losartan-hydrochlorothiazide (HYZAAR) 50-12.5 MG tablet Take 1 tablet by mouth daily. 90 tablet 3  . metFORMIN (GLUCOPHAGE) 1000 MG tablet Take 1 tablet (1,000 mg total) by mouth 2 (two) times daily with a meal. 180 tablet 1  . omeprazole (PRILOSEC) 40 MG capsule Take 1 capsule (40 mg total) by mouth daily. (Patient taking differently: Take 40 mg by mouth daily as needed (heartburn). ) 30 capsule 3  . ONETOUCH DELICA LANCETS 40J MISC Use to check blood sugar one to two times daily.  Dx: E11.9 200 each 3  . venlafaxine XR (EFFEXOR-XR) 37.5 MG 24 hr capsule Take 1 capsule (37.5 mg total) by mouth daily at  12 noon. 14 capsule 0   No current facility-administered medications on file prior to visit.     Allergies  Allergen Reactions  . Penicillins Hives and Itching    Has patient had a PCN reaction causing immediate rash, facial/tongue/throat swelling, SOB or lightheadedness with hypotension: Yes Has patient had a PCN reaction causing severe rash involving mucus membranes or skin necrosis: No Has patient had a PCN reaction that required hospitalization: No Has patient had a PCN reaction occurring within the last 10 years: No If all of the above answers are "NO", then may proceed with Cephalosporin use.   Marland Kitchen Keflex [Cephalexin] Other (See Comments)    Makes loopy and feels like passing out  . Methyldopa Other (See Comments)    unknown    Past Medical History:  Diagnosis Date  . Allergy   . Diabetes mellitus without complication ([place])   . Hypertension     Past Surgical History:  Procedure Laterality Date  . BREAST BIOPSY Right 02/2012   x2. benign  . cyst removed from ovary    . KNEE SURGERY Left   . plantar fasciitis release Bilateral   . TUBAL LIGATION      Family History  Problem Relation Age of Onset  . Breast cancer Maternal Aunt        great Aunts   . Breast cancer Paternal Aunt  Social History   Socioeconomic History  . Marital status: Married    Spouse name: Not on file  . Number of children: Not on file  . Years of education: Not on file  . Highest education level: Not on file  Social Needs  . Financial resource strain: Not on file  . Food insecurity - worry: Not on file  . Food insecurity - inability: Not on file  . Transportation needs - medical: Not on file  . Transportation needs - non-medical: Not on file  Occupational History  . Not on file  Tobacco Use  . Smoking status: Former Smoker    Packs/day: 0.30    Types: Cigarettes    Last attempt to quit: 12/19/2016    Years since quitting: 0.2  . Smokeless tobacco: Never Used  Substance and  Sexual Activity  . Alcohol use: No    Alcohol/week: 0.0 oz  . Drug use: No  . Sexual activity: No  Other Topics Concern  . Not on file  Social History Narrative  . Not on file   Review of Systems  No fever Some nausea last week--but still eating No cold sores or genital herpes     Objective:   Physical Exam  Musculoskeletal:  No hip or spine tenderness Normal ROM of left hip  Skin:  No rash          Assessment & Plan:

## 2017-03-07 NOTE — Telephone Encounter (Signed)
Call pt.. Has she developed a blistering rash in area of tingling? Any other symptoms. Does she need appt?

## 2017-03-07 NOTE — Telephone Encounter (Signed)
PLEASE NOTE: All timestamps contained within this report are represented as [place] Standard Time. CONFIDENTIALTY NOTICE: This fax transmission is intended only for the addressee. It contains information that is legally privileged, confidential or otherwise protected from use or disclosure. If you are not the intended recipient, you are strictly prohibited from reviewing, disclosing, copying using or disseminating any of this information or taking any action in reliance on or regarding this information. If you have received this fax in error, please notify us immediately by telephone so that we can arrange for its return to [place]. Phone: [phone], Toll-Free: [phone], Fax: [phone] Page: 1 of 2 Call Id: [id number] [place] Patient Name: Alyssa Warren Gender: Female DOB: 12/07/1970 Age: 46 Y 61 M 18 D Return Phone Number: [phone] (Primary) Address: City/State/Zip: [place] [address] Client [facility] Night - Client Client Site [place] Physician Eliezer Lofts - MD Contact Type Call Who Is Calling Patient / Member / Family / Caregiver Call Type Triage / Clinical Relationship To Patient Self Return Phone Number [phone] (Primary) Chief Complaint Rash - Widespread Reason for Call Symptomatic / Request for Health Information Initial Comment Caller states c/o shingles, requests Rx Valtrex. [facility] # [phone] Translation No Nurse Assessment Nurse: Melina Modena, RN, [place] Date/Time Eilene Ghazi Time): 03/05/2017 8:04:17 AM Confirm and document reason for call. If symptomatic, describe symptoms. ---Caller states tingling/burning sensation on left hip going upwards. Symptoms started yesterday afternoon. No rash, her body doesn't ever get a rash. Does the patient have any new or worsening symptoms? ---Yes Will a triage be completed?  ---Yes Related visit to physician within the last 2 weeks? ---No Does the PT have any chronic conditions? (i.e. diabetes, asthma, etc.) ---Yes List chronic conditions. ---type 2 DM Is the patient pregnant or possibly pregnant? (Ask all females between the ages of 77-55) ---No Is this a behavioral health or substance abuse call? ---No Guidelines Guideline Title Affirmed Question Affirmed Notes Nurse Date/Time (Eastern Time) Shingles Shingles, questions about [place], RN, Debe Coder 03/05/2017 8:05:42 AM Disp. Time Eilene Ghazi Time) Disposition Final User 03/05/2017 8:13:17 AM Home Care Yes [place], RN, [employer] Disagree/Comply Comply Caller Understands Yes PLEASE NOTE: All timestamps contained within this report are represented as [place] Standard Time. CONFIDENTIALTY NOTICE: This fax transmission is intended only for the addressee. It contains information that is legally privileged, confidential or otherwise protected from use or disclosure. If you are not the intended recipient, you are strictly prohibited from reviewing, disclosing, copying using or disseminating any of this information or taking any action in reliance on or regarding this information. If you have received this fax in error, please notify us immediately by telephone so that we can arrange for its return to [place]. Phone: [phone], Toll-Free: [phone], Fax: [phone] Page: 2 of 2 Call Id: [id number] PreDisposition InappropriateToAsk Care Advice Given Per Guideline HOME CARE: You should be able to treat this at home. SHINGLES - DEFINITION: * Shingles occurs in individuals who have previously had chickenpox. After the chickenpox has resolved, the chickenpox virus remains hidden in nerve roots (dorsal root ganglia) SHINGLES - SYMPTOMS: * Pain usually starts 3-5 days before the rash. * The typical rash appears as multiple small red bumps or blisters grouped together in a band or stripe (i.e., dermatomal distribution). * It  is unilateral, that is, it is present on only one side of the body. * The rash progresses  over 2-3 weeks through the following stages: 1) small red bumps, 2) cloudy blisters, and 3) dry brown crusts. SHINGLES - CONTAGIOUSNESS: * Avoid contact with pregnant women. * Avoid contact with anyone who has a weakened immune system (immunocompromised) (e.g., HIV positive, cancer chemotherapy, chronic steroid treatment, splenectomy, organ transplant). * Avoid contact with anyone who has never had the chickenpox disease or vaccine. SHINGLES - EXPECTED COURSE: * For most individuals the pain and rash last 3-5 weeks. * There is usually no scarring from the rash. CALL BACK IF: CARE ADVICE given per Shingles (Adult) guideline. * There are three antiviral medications that have been proven to be effective: acyclovir (Zovirax), valacyclovir (Valtrex), and famciclovir (Famvir). * There are antiviral medications that can help reduce the duration and severity of shingles. To be effective, the medication should be started within 72 hours of onset of the shingles rash. * You have more questions. Comments User: Melvenia Beam, RN Date/Time Eilene Ghazi Time): 03/05/2017 8:12:52 AM per drugs.com gabapentin is used to treat nerve pain caused by shingles, caller asked me if she should take that, she was given that before when she had the shingles nerve pain. Informed caller Yes I would try it.

## 2017-03-07 NOTE — Telephone Encounter (Signed)
Maycel is scheduled to see Dr. Silvio Pate today 03/07/2017 at 4:30 pm.

## 2017-04-17 ENCOUNTER — Other Ambulatory Visit: Payer: Self-pay | Admitting: Family Medicine

## 2017-05-31 ENCOUNTER — Encounter: Payer: Self-pay | Admitting: *Deleted

## 2017-05-31 ENCOUNTER — Other Ambulatory Visit: Payer: Self-pay | Admitting: Family Medicine

## 2017-06-01 ENCOUNTER — Other Ambulatory Visit: Payer: Self-pay | Admitting: Family Medicine

## 2017-06-13 DIAGNOSIS — H524 Presbyopia: Secondary | ICD-10-CM | POA: Diagnosis not present

## 2017-06-13 LAB — HM DIABETES EYE EXAM

## 2017-06-22 ENCOUNTER — Encounter: Payer: Self-pay | Admitting: Obstetrics & Gynecology

## 2017-06-22 ENCOUNTER — Ambulatory Visit (INDEPENDENT_AMBULATORY_CARE_PROVIDER_SITE_OTHER): Payer: 59 | Admitting: Obstetrics & Gynecology

## 2017-06-22 VITALS — BP 140/98 | HR 104 | Ht 66.0 in | Wt 175.0 lb

## 2017-06-22 DIAGNOSIS — N907 Vulvar cyst: Secondary | ICD-10-CM

## 2017-06-22 MED ORDER — MELOXICAM 15 MG PO TABS: 15.0000 mg | ORAL_TABLET | Freq: Two times a day (BID) | ORAL | 3 refills | Status: AC

## 2017-06-22 MED ORDER — CLINDAMYCIN HCL 300 MG PO CAPS: 300.0000 mg | ORAL_CAPSULE | Freq: Two times a day (BID) | ORAL | 0 refills | Status: AC

## 2017-06-22 NOTE — Progress Notes (Signed)
HPI:      Alyssa Warren is a 47 y.o. [phone], No LMP recorded. after ablation last year, presents today for a problem visit.  She complains of:  Vulvar concern:   This is a 47 y.o. old Caucasian/White female who presents for the evaluation of vulvar lesion(s). She describes the vulvar lesion(s) as asymmetrical, hard, painful mass in left labia; first noted pea sizes mass in Nov and NT then but over last week has grown in size and pain.  She admits to symptoms of pain.  The following aggravating factors are identified: sexual intercourse, physical activity and wearing tight clothing. The following alleviating factors are identified: none.  No prior h/o cyst or lesion or mass.  PMHx: She  has a past medical history of Allergy, Diabetes mellitus without complication ([place]), and Hypertension. Also,  has a past surgical history that includes Tubal ligation; plantar fasciitis release (Bilateral); Knee surgery (Left); cyst removed from ovary; Breast biopsy (Right, 02/2012); Laparoscopic salpingo oophorectomy (Left, 12/23/2016); and Dilatation & currettage/hysteroscopy with novasure ablation (N/A, 12/23/2016)., family history includes Breast cancer in her maternal aunt and paternal aunt.,  reports that she quit smoking about 6 months ago. Her smoking use included cigarettes. She smoked 0.30 packs per day. she has never used smokeless tobacco. She reports that she does not drink alcohol or use drugs.  She has a current medication list which includes the following prescription(s): one touch ultra system kit, clindamycin, glipizide, glucose blood, ibuprofen, losartan-hydrochlorothiazide, meloxicam, metformin, omeprazole, onetouch delica lancets 83T, valacyclovir, and venlafaxine xr. Also, is allergic to penicillins; keflex [cephalexin]; and methyldopa.  Review of Systems  Constitutional: Negative for chills, fever and malaise/fatigue.  HENT: Negative for congestion, sinus pain and sore throat.     Eyes: Negative for blurred vision and pain.  Respiratory: Negative for cough and wheezing.   Cardiovascular: Negative for chest pain and leg swelling.  Gastrointestinal: Negative for abdominal pain, constipation, diarrhea, heartburn, nausea and vomiting.  Genitourinary: Negative for dysuria, frequency, hematuria and urgency.  Musculoskeletal: Negative for back pain, joint pain, myalgias and neck pain.  Skin: Negative for itching and rash.  Neurological: Negative for dizziness, tremors and weakness.  Endo/Heme/Allergies: Does not bruise/bleed easily.  Psychiatric/Behavioral: Negative for depression. The patient is not nervous/anxious and does not have insomnia.     Objective: BP (!) 140/98   Pulse (!) 104   Ht '5\' 6"'  (1.676 m)   Wt 175 lb (79.4 kg)   BMI 28.25 kg/m  Physical Exam  Constitutional: She is oriented to person, place, and time. She appears well-developed and well-nourished. No distress.  Genitourinary: Rectum normal, vagina normal and uterus normal. Pelvic exam was performed with patient supine. There is no rash or lesion on the right labia. There is no rash or lesion on the left labia. Vagina exhibits no lesion. No bleeding in the vagina. Right adnexum does not display mass and does not display tenderness. Left adnexum does not display mass and does not display tenderness. Cervix does not exhibit motion tenderness, lesion, friability or polyp.   Uterus is mobile and midaxial. Uterus is not enlarged or exhibiting a mass.  Genitourinary Comments: Left LABIA MAJORA asymmetry and hypertrophy with palpable tender 1 cm mass within.  No drainage.  Min erythema  HENT:  Head: Normocephalic and atraumatic. Head is without laceration.  Right Ear: Hearing normal.  Left Ear: Hearing normal.  Nose: No epistaxis.  No foreign bodies.  Mouth/Throat: Uvula is midline, oropharynx is clear and moist and  mucous membranes are normal.  Eyes: Pupils are equal, round, and reactive to light.   Neck: Normal range of motion. Neck supple. No thyromegaly present.  Cardiovascular: Normal rate and regular rhythm. Exam reveals no gallop and no friction rub.  No murmur heard. Pulmonary/Chest: Effort normal and breath sounds normal. No respiratory distress. She has no wheezes. Right breast exhibits no mass, no skin change and no tenderness. Left breast exhibits no mass, no skin change and no tenderness.  Abdominal: Soft. Bowel sounds are normal. She exhibits no distension. There is no tenderness. There is no rebound.  Musculoskeletal: Normal range of motion.  Neurological: She is alert and oriented to person, place, and time. No cranial nerve deficit.  Skin: Skin is warm and dry.  Psychiatric: She has a normal mood and affect. Judgment normal.  Vitals reviewed.  ASSESSMENT/PLAN:   Visit Diagnoses    Labial cyst and asymmetry with inflammation and pain  -  Primary    Plan ABX and NSAID, followed by excision w labialplasty if persists and after swelling is alleviated  I have had a careful discussion with this patient about all the options available and the risk/benefits of each. I have fully informed this patient that surgery may subject her to a variety of discomforts and risks: She understands that most patients have surgery with little difficulty, but problems can happen ranging from minor to fatal. These include nausea, vomiting, pain, bleeding, infection, poor healing, hernia, or formation of adhesions. Unexpected reactions may occur from any drug or anesthetic given. Unintended injury may occur to other pelvic or abdominal structures such as Fallopian tubes, ovaries, bladder, ureter (tube from kidney to bladder), or bowel. Nerves going from the pelvis to the legs may be injured. Any such injury may require immediate or later additional surgery to correct the problem. Excessive blood loss requiring transfusion is very unlikely but possible. Dangerous blood clots may form in the legs or lungs.  Physical and sexual activity will be restricted in varying degrees for an indeterminate period of time but most often 2-6 weeks.  Finally, she understands that it is impossible to list every possible undesirable effect and that the condition for which surgery is done is not always cured or significantly improved, and in rare cases may be even worse.Ample time was given to answer all questions.  Barnett Applebaum, MD, [facility], [facility] 06/22/2017  4:36 PM

## 2017-06-23 ENCOUNTER — Telehealth: Payer: Self-pay | Admitting: Obstetrics & Gynecology

## 2017-06-23 NOTE — Addendum Note (Signed)
Addended by: Gae Dry on: 06/23/2017 08:37 PM   Modules accepted: Orders, SmartSet

## 2017-06-23 NOTE — Telephone Encounter (Signed)
-----   Message from Gae Dry, MD sent at 06/22/2017  4:28 PM EST ----- Regarding: surgery Surgery Booking Request Patient Full Name:  Alyssa Warren  MRN: [id number]  DOB: Dec 11, 1970  Surgeon: Hoyt Koch, MD  Requested Surgery Date and Time: 07/05/17 Primary Diagnosis AND Code: Labial mass Secondary Diagnosis and Code:  Surgical Procedure: Labial excision, Labialplasty L&D Notification: No Admission Status: same day surgery Length of Surgery: 30 min Special Case Needs: no H&P: done Phone Interview???: yes Interpreter: Language:  Medical Clearance: no Special Scheduling Instructions: no

## 2017-06-23 NOTE — Telephone Encounter (Signed)
Patient is aware of Pre-admit Testing phone interview on 06/28/17 9am-1pm, and OR on 07/05/17. Patient is aware she may receive calls from the [place] and [facility]. Ext given.

## 2017-06-24 ENCOUNTER — Encounter: Payer: Self-pay | Admitting: Obstetrics & Gynecology

## 2017-06-24 NOTE — Telephone Encounter (Signed)
Please advise 

## 2017-06-25 ENCOUNTER — Encounter: Payer: Self-pay | Admitting: Obstetrics & Gynecology

## 2017-06-28 ENCOUNTER — Other Ambulatory Visit: Payer: Self-pay

## 2017-06-28 ENCOUNTER — Encounter
Admission: RE | Admit: 2017-06-28 | Discharge: 2017-06-28 | Disposition: A | Discharge status: Home / Self Care | Payer: 59 | Source: Ambulatory Visit | Attending: Obstetrics & Gynecology | Admitting: Obstetrics & Gynecology

## 2017-06-28 HISTORY — DX: Gastro-esophageal reflux disease without esophagitis: K21.9

## 2017-06-28 HISTORY — DX: Personal history of urinary calculi: Z87.442

## 2017-06-28 HISTORY — DX: Unspecified osteoarthritis, unspecified site: M19.90

## 2017-06-28 HISTORY — DX: Nonrheumatic mitral (valve) prolapse: I34.1

## 2017-06-28 NOTE — Patient Instructions (Addendum)
Your procedure is scheduled on: 07-05-17 TUESDAY Report to Same Day Surgery 2nd floor medical mall [facility] Entrance-take elevator on left to 2nd floor.  Check in with surgery information desk.) To find out your arrival time please call [phone] between 1PM - 3PM on 07-04-17 MONDAY  Remember: Instructions that are not followed completely may result in serious medical risk, up to and including death, or upon the discretion of your surgeon and anesthesiologist your surgery may need to be rescheduled.    _x___ 1. Do not eat food after midnight the night before your procedure. NO GUM OR CANDY AFTER MIDNIGHT.  You may drink WATER up to 2 hours before you are scheduled to arrive at the hospital for your procedure.  Do not drink WATER within 2 hours of your scheduled arrival to the hospital.  Clear liquids include  --Water or Apple juice without pulp  --Clear carbohydrate beverage such as ClearFast or Gatorade  --Black Coffee or Clear Tea (No milk, no creamers, do not add anything to the coffee or Tea Type 1 and type 2 diabetics should only drink water.    __x__ 2. No Alcohol for 24 hours before or after surgery.   __x__3. No Smoking or e-cigarettes for 24 prior to surgery.  Do not use any chewable tobacco products for at least 6 hour prior to surgery   ____  4. Bring all medications with you on the day of surgery if instructed.    __x__ 5. Notify your doctor if there is any change in your medical condition     (cold, fever, infections).    x___6. On the morning of surgery brush your teeth with toothpaste and water.  You may rinse your mouth with mouth wash if you wish.  Do not swallow any toothpaste or mouthwash.   Do not wear jewelry, make-up, hairpins, clips or nail polish.  Do not wear lotions, powders, or perfumes. You may wear deodorant.  Do not shave 48 hours prior to surgery. Men may shave face and neck.  Do not bring valuables to the hospital.    [facility] is not  responsible for any belongings or valuables.               Contacts, dentures or bridgework may not be worn into surgery.  Leave your suitcase in the car. After surgery it may be brought to your room.  For patients admitted to the hospital, discharge time is determined by your  treatment team.  _  Patients discharged the day of surgery will not be allowed to drive home.  You will need someone to drive you home and stay with you the night of your procedure.    Please read over the following fact sheets that you were given:   [facility] Preparing for Surgery and or MRSA Information   _x___ TAKE THE FOLLOWING MEDICATION THE MORNING OF SURGERY WITH A SMALL SIP OF WATER. These include:  1. EFFEXOR  2.  3.  4.  5.  6.  ____Fleets enema or Magnesium Citrate as directed.   ____ Use CHG Soap or sage wipes as directed on instruction sheet   ____ Use inhalers on the day of surgery and bring to hospital day of surgery  _X___ Stop Metformin 2 days prior to surgery-LAST DOSE ON Saturday, MARCH 9TH   ____ Take 1/2 of usual insulin dose the night before surgery and none on the morning surgery.   ____ Follow recommendations from Cardiologist, Pulmonologist or PCP  regarding stopping Aspirin, Coumadin, Plavix ,Eliquis, Effient, or Pradaxa, and Pletal.  X____Stop Anti-inflammatories such as Advil, Aleve, Ibuprofen, Motrin, Naproxen, MOBIC, Naprosyn, Goodies powders or aspirin products NOW- OK to take Tylenol    ____ Stop supplements until after surgery.     ____ Bring C-Pap to the hospital.

## 2017-06-30 ENCOUNTER — Encounter
Admission: RE | Admit: 2017-06-30 | Discharge: 2017-06-30 | Disposition: A | Discharge status: Home / Self Care | Payer: Commercial Managed Care - HMO | Source: Ambulatory Visit | Attending: Obstetrics & Gynecology | Admitting: Obstetrics & Gynecology

## 2017-06-30 DIAGNOSIS — Z01812 Encounter for preprocedural laboratory examination: Secondary | ICD-10-CM | POA: Insufficient documentation

## 2017-06-30 LAB — BASIC METABOLIC PANEL
ANION GAP: 13 (ref 5–15)
BUN: 16 mg/dL (ref 6–20)
CALCIUM: 9 mg/dL (ref 8.9–10.3)
CO2: 22 mmol/L (ref 22–32)
Chloride: 102 mmol/L (ref 101–111)
Creatinine, Ser: 0.62 mg/dL (ref 0.44–1.00)
GFR calc Af Amer: >60 mL/min (ref >60)
GFR calc non Af Amer: >60 mL/min (ref >60)
GLUCOSE: 266 mg/dL — AB (ref 65–99)
POTASSIUM: 3.8 mmol/L (ref 3.5–5.1)
Sodium: 137 mmol/L (ref 135–145)

## 2017-07-04 ENCOUNTER — Other Ambulatory Visit: Payer: Self-pay | Admitting: Family Medicine

## 2017-07-12 ENCOUNTER — Encounter: Payer: Self-pay | Admitting: *Deleted

## 2017-07-12 ENCOUNTER — Ambulatory Visit
Admission: RE | Admit: 2017-07-12 | Discharge: 2017-07-12 | Disposition: A | Discharge status: Home / Self Care | Payer: Commercial Managed Care - HMO | Source: Ambulatory Visit | Attending: Obstetrics & Gynecology | Admitting: Obstetrics & Gynecology

## 2017-07-12 ENCOUNTER — Ambulatory Visit: Payer: Commercial Managed Care - HMO | Admitting: Certified Registered Nurse Anesthetist

## 2017-07-12 ENCOUNTER — Encounter
Admission: RE | Disposition: A | Discharge status: Home / Self Care | Payer: Self-pay | Source: Ambulatory Visit | Attending: Obstetrics & Gynecology

## 2017-07-12 DIAGNOSIS — R229 Localized swelling, mass and lump, unspecified: Secondary | ICD-10-CM | POA: Diagnosis not present

## 2017-07-12 DIAGNOSIS — Z79899 Other long term (current) drug therapy: Secondary | ICD-10-CM | POA: Diagnosis not present

## 2017-07-12 DIAGNOSIS — E119 Type 2 diabetes mellitus without complications: Secondary | ICD-10-CM | POA: Insufficient documentation

## 2017-07-12 DIAGNOSIS — I10 Essential (primary) hypertension: Secondary | ICD-10-CM | POA: Diagnosis not present

## 2017-07-12 DIAGNOSIS — Z791 Long term (current) use of non-steroidal anti-inflammatories (NSAID): Secondary | ICD-10-CM | POA: Diagnosis not present

## 2017-07-12 DIAGNOSIS — Z87891 Personal history of nicotine dependence: Secondary | ICD-10-CM | POA: Insufficient documentation

## 2017-07-12 DIAGNOSIS — N9089 Other specified noninflammatory disorders of vulva and perineum: Secondary | ICD-10-CM | POA: Diagnosis present

## 2017-07-12 DIAGNOSIS — N907 Vulvar cyst: Secondary | ICD-10-CM | POA: Diagnosis present

## 2017-07-12 DIAGNOSIS — Z7984 Long term (current) use of oral hypoglycemic drugs: Secondary | ICD-10-CM | POA: Insufficient documentation

## 2017-07-12 DIAGNOSIS — N9489 Other specified conditions associated with female genital organs and menstrual cycle: Secondary | ICD-10-CM | POA: Diagnosis not present

## 2017-07-12 HISTORY — PX: LABIOPLASTY: SHX1900

## 2017-07-12 LAB — POCT PREGNANCY, URINE: Preg Test, Ur: NEGATIVE

## 2017-07-12 LAB — GLUCOSE, CAPILLARY
Glucose-Capillary: 232 mg/dL — ABNORMAL HIGH (ref 65–99)
Glucose-Capillary: 141 mg/dL — ABNORMAL HIGH (ref 65–99)

## 2017-07-12 SURGERY — LABIAPLASTY, VULVA
Anesthesia: General

## 2017-07-12 MED ORDER — GLYCOPYRROLATE 0.2 MG/ML IJ SOLN
INTRAMUSCULAR | Status: DC | PRN
  Administered 2017-07-12: 0.1 mg via INTRAVENOUS

## 2017-07-12 MED ORDER — FAMOTIDINE 20 MG PO TABS
ORAL_TABLET | ORAL | Status: AC
  Administered 2017-07-12: 20 mg via ORAL
  Filled 2017-07-12: qty 1

## 2017-07-12 MED ORDER — MELOXICAM 15 MG PO TABS: 15.0000 mg | ORAL_TABLET | Freq: Two times a day (BID) | ORAL | 1 refills | Status: DC | PRN

## 2017-07-12 MED ORDER — SODIUM CHLORIDE 0.9 % IV SOLN
INTRAVENOUS | Status: DC
  Administered 2017-07-12: 11:00:00 via INTRAVENOUS

## 2017-07-12 MED ORDER — LIDOCAINE-EPINEPHRINE 2 %-1:100000 IJ SOLN
INTRAMUSCULAR | Status: AC
  Filled 2017-07-12: qty 1

## 2017-07-12 MED ORDER — ONDANSETRON HCL 4 MG/2ML IJ SOLN
INTRAMUSCULAR | Status: AC
  Filled 2017-07-12: qty 2

## 2017-07-12 MED ORDER — LIDOCAINE HCL (CARDIAC) 20 MG/ML IV SOLN
INTRAVENOUS | Status: DC | PRN
  Administered 2017-07-12: 50 mg via INTRAVENOUS

## 2017-07-12 MED ORDER — SCOPOLAMINE 1 MG/3DAYS TD PT72
MEDICATED_PATCH | TRANSDERMAL | Status: AC
  Filled 2017-07-12: qty 1

## 2017-07-12 MED ORDER — LACTATED RINGERS IV SOLN
INTRAVENOUS | Status: DC | PRN
  Administered 2017-07-12 (×2): via INTRAVENOUS

## 2017-07-12 MED ORDER — DEXAMETHASONE SODIUM PHOSPHATE 10 MG/ML IJ SOLN
INTRAMUSCULAR | Status: DC | PRN
  Administered 2017-07-12: 5 mg via INTRAVENOUS

## 2017-07-12 MED ORDER — MIDAZOLAM HCL 5 MG/5ML IJ SOLN
INTRAMUSCULAR | Status: AC
  Filled 2017-07-12: qty 5

## 2017-07-12 MED ORDER — LIDOCAINE-EPINEPHRINE 2 %-1:100000 IJ SOLN
INTRAMUSCULAR | Status: DC | PRN
  Administered 2017-07-12: 2 mL

## 2017-07-12 MED ORDER — PROPOFOL 10 MG/ML IV BOLUS
INTRAVENOUS | Status: AC
  Filled 2017-07-12: qty 20

## 2017-07-12 MED ORDER — KETOROLAC TROMETHAMINE 30 MG/ML IJ SOLN
30.0000 mg | Freq: Four times a day (QID) | INTRAMUSCULAR | Status: DC
  Filled 2017-07-12 (×5): qty 1

## 2017-07-12 MED ORDER — ACETAMINOPHEN NICU IV SYRINGE 10 MG/ML
INTRAVENOUS | Status: AC
  Filled 2017-07-12: qty 1

## 2017-07-12 MED ORDER — ACETAMINOPHEN 325 MG PO TABS: 650.0000 mg | ORAL_TABLET | ORAL | Status: DC | PRN

## 2017-07-12 MED ORDER — ACETAMINOPHEN 650 MG RE SUPP
650.0000 mg | RECTAL | Status: DC | PRN
  Filled 2017-07-12: qty 1

## 2017-07-12 MED ORDER — OXYCODONE HCL 5 MG PO TABS: 5.0000 mg | ORAL_TABLET | ORAL | Status: DC | PRN

## 2017-07-12 MED ORDER — MIDAZOLAM HCL 2 MG/2ML IJ SOLN
INTRAMUSCULAR | Status: DC | PRN
  Administered 2017-07-12: 2 mg via INTRAVENOUS
  Administered 2017-07-12: 3 mg via INTRAVENOUS

## 2017-07-12 MED ORDER — GLYCOPYRROLATE 0.2 MG/ML IJ SOLN
INTRAMUSCULAR | Status: AC
  Filled 2017-07-12: qty 1

## 2017-07-12 MED ORDER — HYDROMORPHONE HCL 1 MG/ML IJ SOLN
INTRAMUSCULAR | Status: DC | PRN
  Administered 2017-07-12: 1 mg via INTRAVENOUS

## 2017-07-12 MED ORDER — PROMETHAZINE HCL 25 MG/ML IJ SOLN: 6.2500 mg | INTRAMUSCULAR | Status: DC | PRN

## 2017-07-12 MED ORDER — ONDANSETRON HCL 4 MG/2ML IJ SOLN
INTRAMUSCULAR | Status: DC | PRN
  Administered 2017-07-12: 4 mg via INTRAVENOUS

## 2017-07-12 MED ORDER — DEXAMETHASONE SODIUM PHOSPHATE 10 MG/ML IJ SOLN
INTRAMUSCULAR | Status: AC
  Filled 2017-07-12: qty 1

## 2017-07-12 MED ORDER — FENTANYL CITRATE (PF) 100 MCG/2ML IJ SOLN: 25.0000 ug | INTRAMUSCULAR | Status: DC | PRN

## 2017-07-12 MED ORDER — HYDROMORPHONE HCL 1 MG/ML IJ SOLN
INTRAMUSCULAR | Status: AC
  Filled 2017-07-12: qty 1

## 2017-07-12 MED ORDER — KETOROLAC TROMETHAMINE 30 MG/ML IJ SOLN
INTRAMUSCULAR | Status: DC | PRN
  Administered 2017-07-12: 30 mg via INTRAVENOUS

## 2017-07-12 MED ORDER — LACTATED RINGERS IV SOLN: INTRAVENOUS | Status: DC

## 2017-07-12 MED ORDER — ACETAMINOPHEN 10 MG/ML IV SOLN
INTRAVENOUS | Status: DC | PRN
  Administered 2017-07-12: 1000 mg via INTRAVENOUS

## 2017-07-12 MED ORDER — LIDOCAINE HCL (PF) 2 % IJ SOLN
INTRAMUSCULAR | Status: AC
  Filled 2017-07-12: qty 10

## 2017-07-12 MED ORDER — PROPOFOL 10 MG/ML IV BOLUS
INTRAVENOUS | Status: DC | PRN
  Administered 2017-07-12: 150 mg via INTRAVENOUS
  Administered 2017-07-12: 50 mg via INTRAVENOUS

## 2017-07-12 MED ORDER — SCOPOLAMINE 1 MG/3DAYS TD PT72
1.0000 | MEDICATED_PATCH | Freq: Once | TRANSDERMAL | Status: DC
  Administered 2017-07-12: 1.5 mg via TRANSDERMAL

## 2017-07-12 MED ORDER — FAMOTIDINE 20 MG PO TABS
20.0000 mg | ORAL_TABLET | Freq: Once | ORAL | Status: AC
  Administered 2017-07-12: 20 mg via ORAL

## 2017-07-12 MED ORDER — KETOROLAC TROMETHAMINE 30 MG/ML IJ SOLN
INTRAMUSCULAR | Status: AC
  Filled 2017-07-12: qty 1

## 2017-07-12 MED ORDER — SODIUM CHLORIDE 0.9 % IJ SOLN
INTRAMUSCULAR | Status: AC
  Filled 2017-07-12: qty 10

## 2017-07-12 SURGICAL SUPPLY — 26 items
BLADE SURG 15 STRL LF DISP TIS (BLADE) ×1 IMPLANT
BLADE SURG 15 STRL SS (BLADE) ×2
BLADE SURG SZ10 CARB STEEL (BLADE) ×2 IMPLANT
CATH ROBINSON RED A/P 16FR (CATHETERS) ×2 IMPLANT
DRAPE PERI LITHO V/GYN (MISCELLANEOUS) ×2 IMPLANT
DRAPE UNDER BUTTOCK W/FLU (DRAPES) ×2 IMPLANT
ELECT REM PT RETURN 9FT ADLT (ELECTROSURGICAL) ×2
ELECTRODE REM PT RTRN 9FT ADLT (ELECTROSURGICAL) ×1 IMPLANT
GLOVE BIO SURGEON STRL SZ8 (GLOVE) ×4 IMPLANT
GLOVE INDICATOR 8.0 STRL GRN (GLOVE) ×4 IMPLANT
GOWN STRL REUS W/ TWL LRG LVL3 (GOWN DISPOSABLE) ×1 IMPLANT
GOWN STRL REUS W/ TWL XL LVL3 (GOWN DISPOSABLE) ×1 IMPLANT
GOWN STRL REUS W/TWL LRG LVL3 (GOWN DISPOSABLE) ×2
GOWN STRL REUS W/TWL XL LVL3 (GOWN DISPOSABLE) ×2
KIT TURNOVER CYSTO (KITS) ×2 IMPLANT
NEEDLE HYPO 22GX1.5 SAFETY (NEEDLE) ×2 IMPLANT
NS IRRIG 500ML POUR BTL (IV SOLUTION) ×2 IMPLANT
PACK BASIN MINOR ARMC (MISCELLANEOUS) ×2 IMPLANT
PAD OB MATERNITY 4.3X12.25 (PERSONAL CARE ITEMS) ×2 IMPLANT
PAD PREP 24X41 OB/GYN DISP (PERSONAL CARE ITEMS) ×2 IMPLANT
SPONGE XRAY 4X4 16PLY STRL (MISCELLANEOUS) ×2 IMPLANT
SUT VIC AB 2-0 CT1 27 (SUTURE) ×2
SUT VIC AB 2-0 CT1 TAPERPNT 27 (SUTURE) ×1 IMPLANT
SUT VIC AB 4-0 FS2 27 (SUTURE) ×1 IMPLANT
SYR CONTROL 10ML (SYRINGE) ×2 IMPLANT
TAPE TRANSPORE STRL 2 31045 (GAUZE/BANDAGES/DRESSINGS) ×1 IMPLANT

## 2017-07-12 NOTE — Discharge Instructions (Signed)
Cyst or Abscess A labial cyst is a fluid-filled sac that forms on a gland or follicle. These glands are small glands that are found in the folds of skin (labia). This type of cyst causes a bulge on the side of the vagina. A cyst that is not large or infected may not cause problems. However, if the fluid in the cyst becomes infected, the cyst can turn into an abscess. An abscess may cause discomfort or pain. Follow these instructions at home:  Take medicines only as told by your doctor.  If you were prescribed an antibiotic medicine, finish all of it even if you start to feel better.  Apply warm, wet compresses to the area or take warm, shallow baths that cover your pelvic area (sitz baths). Do this many times each day or as told by your doctor.  Do not squeeze the cyst. Do not apply heavy pressure to it.  Do not have sex until the cyst has gone away.  If your cyst or abscess was opened by your doctor, a small piece of gauze or a drain may have been placed in the area. That lets the cyst drain. Do not remove the gauze or the drain until your doctor tells you it is okay to do that.  Do not wear tampons. Wear feminine pads as needed for any fluid or blood.  Keep all follow-up visits as told by your doctor. This is important. Contact a doctor if:  Your pain, puffiness (swelling), or redness in the area of the cyst gets worse.  You have fluid or pus pus coming from the cyst.  You have a fever. This information is not intended to replace advice given to you by your health care provider. Make sure you discuss any questions you have with your health care provider. Document Released: 07/09/2008 Document Revised: 09/18/2015 Document Reviewed: 11/26/2013 Elsevier Interactive Patient Education  2018 [place]   1) The drugs that you were given will stay in your system until tomorrow so for the next 24 hours you should not:  A) Drive an  automobile B) Make any legal decisions C) Drink any alcoholic beverage   2) You may resume regular meals tomorrow.  Today it is better to start with liquids and gradually work up to solid foods.  You may eat anything you prefer, but it is better to start with liquids, then soup and crackers, and gradually work up to solid foods.   3) Please notify your doctor immediately if you have any unusual bleeding, trouble breathing, redness and pain at the surgery site, drainage, fever, or pain not relieved by medication.    4) Additional Instructions:        Please contact your physician with any problems or Same Day Surgery at [phone], Monday through Friday 6 am to 4 pm, or [place] at [facility] number at [phone].

## 2017-07-12 NOTE — Anesthesia Post-op Follow-up Note (Signed)
Anesthesia QCDR form completed.        

## 2017-07-12 NOTE — Op Note (Signed)
Alyssa Warren PROCEDURE DATE: 07/12/2017  PREOPERATIVE DIAGNOSIS: Left Labial Cyst POSTOPERATIVE DIAGNOSIS: The same PROCEDURE: Excision of Left Labial Cyst SURGEON: Barnett Applebaum, MD, FACOG FINDINGS:  1 cm left labial cyst ANESTHESIA:    General LMA ESTIMATED BLOOD LOSS: min SPECIMENS: Cyst COMPLICATIONS: None immediate  PROCEDURE IN DETAIL:  The patient was taken to the operating room where general anesthesia was administered and was found to be adequate.  She was placed in the dorsal lithotomy position, and was prepped and draped in a sterile manner. After an adequate timeout was performed, attention was turned to the left side of her vulva where the cyst was palpated. An incision was made after grasping the skin overlying the cyst and an ellipse of skin excised along with the cyst beneath the skin.  There is minimal need for excision of labial skin, so no labioplasty is warranted.  A deep suture is placed of Vicryl to close the space and then the subcuticular closure is performed with a 4-0 Vicryl suture. Dermabond is applied over the incision.  Patient goes to PACU in good condition; all sponges, sharps, and instruments accounted for.  Barnett Applebaum, MD, [facility], [facility] 07/12/2017  1:29 PM

## 2017-07-12 NOTE — Anesthesia Procedure Notes (Signed)
Procedure Name: LMA Insertion Date/Time: 07/12/2017 12:34 PM Performed by: Nile Riggs, CRNA Pre-anesthesia Checklist: Patient identified, Emergency Drugs available, Suction available, Patient being monitored and Timeout performed Patient Re-evaluated:Patient Re-evaluated prior to induction Oxygen Delivery Method: Circle system utilized Preoxygenation: Pre-oxygenation with 100% oxygen Induction Type: IV induction Ventilation: Mask ventilation without difficulty LMA: LMA inserted LMA Size: 3.5 Number of attempts: 1 Placement Confirmation: positive ETCO2,  CO2 detector and breath sounds checked- equal and bilateral Tube secured with: Tape Dental Injury: Teeth and Oropharynx as per pre-operative assessment

## 2017-07-12 NOTE — Transfer of Care (Signed)
Immediate Anesthesia Transfer of Care Note  Patient: Alyssa Warren  Procedure(s) Performed: EXCISION OF LABIAL CYST (N/A )  Patient Location: PACU  Anesthesia Type:General  Level of Consciousness: awake, alert , oriented and patient cooperative  Airway & Oxygen Therapy: Patient Spontanous Breathing and Patient connected to face mask oxygen  Post-op Assessment: Report given to RN, Post -op Vital signs reviewed and stable and Patient moving all extremities X 4  Post vital signs: Reviewed and stable  Last Vitals:  Vitals:   07/12/17 1025 07/12/17 1334  BP: 138/75 (!) 129/92  Pulse: 78 89  Resp: 20 11  Temp: 36.7 C 37.3 C  SpO2: 99% 92%    Last Pain:  Vitals:   07/12/17 1025  TempSrc: Tympanic         Complications: No apparent anesthesia complications

## 2017-07-12 NOTE — H&P (Signed)
History and Physical Interval Note:  07/12/2017 12:01 PM  Alyssa Warren  has presented today for surgery, with the diagnosis of LABIAL MASS  The various methods of treatment have been discussed with the patient and family. After consideration of risks, benefits and other options for treatment, the patient has consented to  Procedure(s): LABIAPLASTY, LABIAL EXCISION (N/A) as a surgical intervention .  The patient's history has been reviewed, patient examined, no change in status, stable for surgery.  Pt has the following beta blocker history-  Not taking Beta Blocker.  I have reviewed the patient's chart and labs.  Questions were answered to the patient's satisfaction.    Barnett Applebaum, MD, [facility], [facility] 07/12/2017  12:01 PM

## 2017-07-12 NOTE — Anesthesia Preprocedure Evaluation (Signed)
Anesthesia Evaluation  Patient identified by MRN, date of birth, ID band Patient awake    Reviewed: Allergy & Precautions, H&P , NPO status , Patient's Chart, lab work & pertinent test results, reviewed documented beta blocker date and time   History of Anesthesia Complications (+) PONV and history of anesthetic complications  Airway Mallampati: I  TM Distance: >3 FB Neck ROM: full    Dental  (+) Caps, Dental Advidsory Given, Teeth Intact   Pulmonary neg pulmonary ROS, Current Smoker, former smoker,           Cardiovascular Exercise Tolerance: Good hypertension, (-) angina(-) CAD, (-) Past MI, (-) Cardiac Stents and (-) CABG (-) dysrhythmias + Valvular Problems/Murmurs AS      Neuro/Psych PSYCHIATRIC DISORDERS (Depression) negative neurological ROS     GI/Hepatic negative GI ROS, Neg liver ROS,   Endo/Other  diabetes, Type 2, Oral Hypoglycemic Agents  Renal/GU negative Renal ROS  negative genitourinary   Musculoskeletal   Abdominal   Peds  Hematology negative hematology ROS (+)   Anesthesia Other Findings Past Medical History: No date: Allergy No date: Diabetes mellitus without complication (HCC) No date: Hypertension   Reproductive/Obstetrics negative OB ROS                             Anesthesia Physical  Anesthesia Plan  ASA: II  Anesthesia Plan: General   Post-op Pain Management:    Induction: Intravenous  PONV Risk Score and Plan: 4 or greater and Ondansetron, Dexamethasone, Midazolam and Scopolamine patch - Pre-op  Airway Management Planned: LMA  Additional Equipment:   Intra-op Plan:   Post-operative Plan: Extubation in OR  Informed Consent: I have reviewed the patients History and Physical, chart, labs and discussed the procedure including the risks, benefits and alternatives for the proposed anesthesia with the patient or authorized representative who has  indicated his/her understanding and acceptance.   Dental Advisory Given  Plan Discussed with: Anesthesiologist, CRNA and Surgeon  Anesthesia Plan Comments:         Anesthesia Quick Evaluation

## 2017-07-13 ENCOUNTER — Encounter: Payer: Self-pay | Admitting: Obstetrics & Gynecology

## 2017-07-13 NOTE — Anesthesia Postprocedure Evaluation (Signed)
Anesthesia Post Note  Patient: Alyssa Warren  Procedure(s) Performed: EXCISION OF LABIAL CYST (N/A )  Patient location during evaluation: PACU Anesthesia Type: General Level of consciousness: awake and alert Pain management: pain level controlled Vital Signs Assessment: post-procedure vital signs reviewed and stable Respiratory status: spontaneous breathing, nonlabored ventilation, respiratory function stable and patient connected to nasal cannula oxygen Cardiovascular status: blood pressure returned to baseline and stable Postop Assessment: no apparent nausea or vomiting Anesthetic complications: no     Last Vitals:  Vitals:   07/12/17 1458 07/12/17 1530  BP: (!) 128/91 (!) 132/94  Pulse: 91 89  Resp: 16 16  Temp: 36.9 C 36.8 C  SpO2: 93% 96%    Last Pain:  Vitals:   07/12/17 1530  TempSrc: Tympanic  PainSc:                  Martha Clan

## 2017-07-14 LAB — SURGICAL PATHOLOGY

## 2017-07-20 ENCOUNTER — Telehealth: Payer: Self-pay | Admitting: Family Medicine

## 2017-07-20 DIAGNOSIS — E1165 Type 2 diabetes mellitus with hyperglycemia: Secondary | ICD-10-CM

## 2017-07-20 DIAGNOSIS — E785 Hyperlipidemia, unspecified: Principal | ICD-10-CM

## 2017-07-20 DIAGNOSIS — E1169 Type 2 diabetes mellitus with other specified complication: Secondary | ICD-10-CM

## 2017-07-20 NOTE — Telephone Encounter (Signed)
-----   Message from Ellamae Sia sent at 07/13/2017 11:36 AM EDT ----- Regarding: Lab orders for Thursday, 3.28.19 Patient is scheduled for CPX labs, please order future labs, Thanks , Karna Christmas

## 2017-07-21 ENCOUNTER — Other Ambulatory Visit (INDEPENDENT_AMBULATORY_CARE_PROVIDER_SITE_OTHER): Payer: 59

## 2017-07-21 ENCOUNTER — Encounter: Payer: Self-pay | Admitting: Radiology

## 2017-07-21 DIAGNOSIS — E1165 Type 2 diabetes mellitus with hyperglycemia: Secondary | ICD-10-CM

## 2017-07-21 LAB — COMPREHENSIVE METABOLIC PANEL
ALT: 34 U/L (ref 0–35)
AST: 24 U/L (ref 0–37)
Albumin: 4.2 g/dL (ref 3.5–5.2)
Alkaline Phosphatase: 64 U/L (ref 39–117)
BUN: 19 mg/dL (ref 6–23)
CALCIUM: 10 mg/dL (ref 8.4–10.5)
CHLORIDE: 100 meq/L (ref 96–112)
CO2: 27 mEq/L (ref 19–32)
Creatinine, Ser: 0.79 mg/dL (ref 0.40–1.20)
GFR: 82.73 mL/min (ref >60.00)
Glucose, Bld: 208 mg/dL — ABNORMAL HIGH (ref 70–99)
POTASSIUM: 4.2 meq/L (ref 3.5–5.1)
Sodium: 137 mEq/L (ref 135–145)
Total Bilirubin: 0.6 mg/dL (ref 0.2–1.2)
Total Protein: 7.2 g/dL (ref 6.0–8.3)

## 2017-07-21 LAB — LIPID PANEL
CHOL/HDL RATIO: 8
CHOLESTEROL: 313 mg/dL — AB (ref 0–200)
HDL: 37.8 mg/dL — AB (ref >39.00)

## 2017-07-21 LAB — HEMOGLOBIN A1C: HEMOGLOBIN A1C: 9.1 % — AB (ref 4.6–6.5)

## 2017-07-21 LAB — LDL CHOLESTEROL, DIRECT: Direct LDL: 95 mg/dL

## 2017-07-21 NOTE — Progress Notes (Signed)
Opened in error

## 2017-07-22 ENCOUNTER — Telehealth: Payer: Self-pay | Admitting: Family Medicine

## 2017-07-22 ENCOUNTER — Other Ambulatory Visit: Payer: Self-pay | Admitting: Family Medicine

## 2017-07-22 MED ORDER — FENOFIBRATE 160 MG PO TABS: 160.0000 mg | ORAL_TABLET | Freq: Every day | ORAL | 11 refills | Status: DC

## 2017-07-22 NOTE — Telephone Encounter (Signed)
Close note

## 2017-07-27 ENCOUNTER — Encounter: Payer: Self-pay | Admitting: *Deleted

## 2017-07-28 ENCOUNTER — Encounter: Payer: Self-pay | Admitting: Obstetrics & Gynecology

## 2017-07-28 ENCOUNTER — Encounter: Payer: Self-pay | Admitting: Family Medicine

## 2017-07-28 ENCOUNTER — Ambulatory Visit (INDEPENDENT_AMBULATORY_CARE_PROVIDER_SITE_OTHER): Payer: 59 | Admitting: Obstetrics & Gynecology

## 2017-07-28 ENCOUNTER — Ambulatory Visit (INDEPENDENT_AMBULATORY_CARE_PROVIDER_SITE_OTHER): Payer: 59 | Admitting: Family Medicine

## 2017-07-28 VITALS — BP 140/80 | HR 112 | Ht 66.0 in | Wt 174.0 lb

## 2017-07-28 VITALS — BP 134/86 | HR 107 | Temp 98.5°F | Ht 65.75 in | Wt 172.5 lb

## 2017-07-28 DIAGNOSIS — E785 Hyperlipidemia, unspecified: Secondary | ICD-10-CM

## 2017-07-28 DIAGNOSIS — E1169 Type 2 diabetes mellitus with other specified complication: Secondary | ICD-10-CM

## 2017-07-28 DIAGNOSIS — N907 Vulvar cyst: Secondary | ICD-10-CM

## 2017-07-28 DIAGNOSIS — E781 Pure hyperglyceridemia: Secondary | ICD-10-CM

## 2017-07-28 DIAGNOSIS — Z0000 Encounter for general adult medical examination without abnormal findings: Secondary | ICD-10-CM

## 2017-07-28 DIAGNOSIS — E1165 Type 2 diabetes mellitus with hyperglycemia: Secondary | ICD-10-CM | POA: Diagnosis not present

## 2017-07-28 DIAGNOSIS — I10 Essential (primary) hypertension: Secondary | ICD-10-CM | POA: Diagnosis not present

## 2017-07-28 DIAGNOSIS — F320 Major depressive disorder, single episode, mild: Secondary | ICD-10-CM

## 2017-07-28 LAB — HM DIABETES FOOT EXAM

## 2017-07-28 MED ORDER — VENLAFAXINE HCL ER 37.5 MG PO CP24: ORAL_CAPSULE | ORAL | 1 refills | Status: DC

## 2017-07-28 MED ORDER — "NEEDLE (DISP) 27G X 1/2"" MISC": 3 refills | Status: DC

## 2017-07-28 MED ORDER — LOSARTAN POTASSIUM-HCTZ 50-12.5 MG PO TABS: 1.0000 | ORAL_TABLET | Freq: Every day | ORAL | 3 refills | Status: DC

## 2017-07-28 MED ORDER — LIRAGLUTIDE 18 MG/3ML ~~LOC~~ SOPN: 0.6000 mg | PEN_INJECTOR | Freq: Every day | SUBCUTANEOUS | 3 refills | Status: DC

## 2017-07-28 MED ORDER — METFORMIN HCL 1000 MG PO TABS: ORAL_TABLET | ORAL | 3 refills | Status: DC

## 2017-07-28 MED ORDER — GLIPIZIDE ER 10 MG PO TB24: 10.0000 mg | ORAL_TABLET | Freq: Every day | ORAL | 3 refills | Status: DC

## 2017-07-28 MED ORDER — INSULIN PEN NEEDLE 31G X 8 MM MISC: 3 refills | Status: DC

## 2017-07-28 NOTE — Assessment & Plan Note (Signed)
Stable control on venlafaxine low dose.

## 2017-07-28 NOTE — Assessment & Plan Note (Signed)
LDL at goal with lifestyle.

## 2017-07-28 NOTE — Progress Notes (Signed)
  Postoperative Follow-up Patient presents post op from left labial cyst excision for painful left labial cyst, 2 weeks ago.  Subjective: Patient reports marked improvement in her preop symptoms. Eating a regular diet without difficulty. The patient is not having any pain.  Activity: normal activities of daily living. Patient reports vaginal sx's of None  Objective: BP 140/80 (BP Location: Right Arm, Patient Position: Sitting, Cuff Size: Normal)   Pulse (!) 112   Ht 5\' 6"  (1.676 m)   Wt 174 lb (78.9 kg)   BMI 28.08 kg/m  Physical Exam  Constitutional: She is oriented to person, place, and time. She appears well-developed and well-nourished. No distress.  Genitourinary: Vagina normal. Pelvic exam was performed with patient supine. There is no rash, tenderness or lesion on the right labia. There is no rash, tenderness or lesion on the left labia. No erythema or bleeding in the vagina.  Genitourinary Comments: Left labial incision healing well, no erythema or mass  Cardiovascular: Normal rate.  Pulmonary/Chest: Effort normal.  Abdominal: Soft. She exhibits no distension. There is no tenderness.  Musculoskeletal: Normal range of motion.  Neurological: She is alert and oriented to person, place, and time. No cranial nerve deficit.  Skin: Skin is warm and dry.  Psychiatric: She has a normal mood and affect.   Assessment: s/p :  Labial Cyst Excision progressing well  Plan: Patient has done well after surgery with no apparent complications.  I have discussed the post-operative course to date, and the expected progress moving forward.  The patient understands what complications to be concerned about.  I will see the patient in routine follow up, or sooner if needed.    Activity plan: No restriction.  Hoyt Koch 07/28/2017, 11:00 AM

## 2017-07-28 NOTE — Assessment & Plan Note (Signed)
Start victoza low dose.. Follow up in 2-3 weeks. Encouraged exercise, weight loss, healthy eating habits.

## 2017-07-28 NOTE — Assessment & Plan Note (Signed)
Well controlled. Continue current medication.  

## 2017-07-28 NOTE — Assessment & Plan Note (Signed)
Now on fenofibrate.. recheck trigs in 1-2 months

## 2017-07-28 NOTE — Progress Notes (Signed)
Subjective:    Patient ID: Alyssa Warren, female    DOB: 08-30-1970, 47 y.o.   MRN: [id number]  HPI The patient is here for annual wellness exam and preventative care.     recent labia excision... recovering well. HAs appt with GYN today.  Depression, anxiety, PTSD: followed by counselor. On venlafaxine daily and uses alprazolam prn. She would like to wean off if able.  Hypertension: At goal on losartan HCTZ  now that pain is resolved. Using medication without problems or lightheadedness:None  Chest pain with exertion:None  Edema:None  Short of breath:none   Well controlled at other MDs.  Elevated Cholesterol:  Started on fenofibrate the day labs returned. Lab Results  Component Value Date   CHOL 313 (H) 07/21/2017   HDL 37.80 (L) 07/21/2017   LDLCALC 26 06/29/2013   LDLDIRECT 95.0 07/21/2017   TRIG (H) 07/21/2017    1430.0 Triglyceride is over 400; calculations on Lipids are invalid.   CHOLHDL 8 07/21/2017  Using medications without problems: Muscle aches:  Diet compliance: Exercise: Other complaints:  Diabetes:   She has been under stress lately.. Eating poorly.  Taking metformin and glipizide at max. Lab Results  Component Value Date   HGBA1C 9.1 (H) 07/21/2017  Using medications without difficulties: Hypoglycemic episodes: Hyperglycemic episodes: Feet problems: Blood Sugars averaging: eye exam within last year:    Body mass index is 28.05 kg/m.  Wt Readings from Last 3 Encounters:  07/28/17 172 lb 8 oz (78.2 kg)  06/22/17 175 lb (79.4 kg)  03/07/17 174 lb (78.9 kg)     Social History /Family History/Past Medical History reviewed in detail and updated in EMR if needed. Blood pressure 134/86, pulse (!) 107, temperature 98.5 F (36.9 C), temperature source Oral, height 5' 5.75" (1.67 m), weight 172 lb 8 oz (78.2 kg), SpO2 97 %.  Review of Systems  Constitutional: Negative for fatigue and fever.  HENT: Negative for congestion.   Eyes:  Negative for pain.  Respiratory: Negative for cough and shortness of breath.   Cardiovascular: Negative for chest pain, palpitations and leg swelling.  Gastrointestinal: Negative for abdominal pain.  Genitourinary: Negative for dysuria and vaginal bleeding.  Musculoskeletal: Negative for back pain.  Neurological: Negative for syncope, light-headedness and headaches.  Psychiatric/Behavioral: Negative for dysphoric mood.       Objective:   Physical Exam  Constitutional: Vital signs are normal. She appears well-developed and well-nourished. She is cooperative.  Non-toxic appearance. She does not appear ill. No distress.  HENT:  Head: Normocephalic.  Right Ear: Hearing, tympanic membrane, external ear and ear canal normal.  Left Ear: Hearing, tympanic membrane, external ear and ear canal normal.  Nose: Nose normal.  Eyes: Pupils are equal, round, and reactive to light. Conjunctivae, EOM and lids are normal. Lids are everted and swept, no foreign bodies found.  Neck: Trachea normal and normal range of motion. Neck supple. Carotid bruit is not present. No thyroid mass and no thyromegaly present.  Cardiovascular: Normal rate, regular rhythm, S1 normal, S2 normal, normal heart sounds and intact distal pulses. Exam reveals no gallop.  No murmur heard. Pulmonary/Chest: Effort normal and breath sounds normal. No respiratory distress. She has no wheezes. She has no rhonchi. She has no rales.  Abdominal: Soft. Normal appearance and bowel sounds are normal. She exhibits no distension, no fluid wave, no abdominal bruit and no mass. There is no hepatosplenomegaly. There is no tenderness. There is no rebound, no guarding and no CVA tenderness. No hernia.  Lymphadenopathy:    She has no cervical adenopathy.    She has no axillary adenopathy.  Neurological: She is alert. She has normal strength. No cranial nerve deficit or sensory deficit.  Skin: Skin is warm, dry and intact. No rash noted.  Psychiatric:  Her speech is normal and behavior is normal. Judgment normal. Her mood appears not anxious. Cognition and memory are normal. She does not exhibit a depressed mood.      Diabetic foot exam: Normal inspection No skin breakdown No calluses  Normal DP pulses Normal sensation to light touch and monofilament Nails normal     Assessment & Plan:  The patient's preventative maintenance and recommended screening tests for an annual wellness exam were reviewed in full today. Brought up to date unless services declined.  Counselled on the importance of diet, exercise, and its role in overall health and mortality. The patient's FH and SH was reviewed, including their home life, tobacco status, and drug and alcohol status.   Vaccine: uptodate with Tdap, PNA Mammo:08/2016 PAP/DVE: nml pap 2015, has had 3 normals in a row, on q3 year schedule.  Smoker, refused med to help with cessation... Has cut down to 5-6 a day.      HIV: refused

## 2017-07-28 NOTE — Patient Instructions (Addendum)
Quit smoking.  work on low carb diet, decreasing stress, increase activity.  Start  victoza Check blood sugar at home daily.. Bring in measurement to next OV.  Have eye DM send [place] a note.

## 2017-08-01 ENCOUNTER — Encounter: Payer: Self-pay | Admitting: Family Medicine

## 2017-08-16 ENCOUNTER — Ambulatory Visit: Payer: 59 | Admitting: Family Medicine

## 2017-08-26 ENCOUNTER — Other Ambulatory Visit: Payer: Self-pay

## 2017-08-26 ENCOUNTER — Encounter: Payer: Self-pay | Admitting: Family Medicine

## 2017-08-26 ENCOUNTER — Ambulatory Visit (INDEPENDENT_AMBULATORY_CARE_PROVIDER_SITE_OTHER): Payer: 59 | Admitting: Family Medicine

## 2017-08-26 VITALS — BP 130/90 | HR 101 | Temp 98.3°F | Ht 65.75 in | Wt 173.2 lb

## 2017-08-26 DIAGNOSIS — E781 Pure hyperglyceridemia: Secondary | ICD-10-CM | POA: Diagnosis not present

## 2017-08-26 DIAGNOSIS — E1165 Type 2 diabetes mellitus with hyperglycemia: Secondary | ICD-10-CM

## 2017-08-26 NOTE — Assessment & Plan Note (Signed)
Improving control with addition of victoza. COntinue current dose but if not losing weight or FBS > 120.. Will plan to increase. Repeat A1C in 3 months Encouraged exercise, weight loss, healthy eating habits.

## 2017-08-26 NOTE — Progress Notes (Signed)
   Subjective:    Patient ID: Alyssa Warren, female    DOB: 1970/07/06, 47 y.o.   MRN: [id number]  HPI  47 year old female pt presents for 2-3 week follow up victoza start for DM.  Diabetes:   Lab Results  Component Value Date   HGBA1C 9.1 (H) 07/21/2017  Using medications without difficulties: Hypoglycemic episodes:none Hyperglycemic episodes: none Blood Sugars averaging: FBS 130-159, 2 hours after meal 119-155  She has been eating better overall.   No abd pain, no emesis past nausea in first week.  She feels she has decreased appetite, craving sugar less.   Wt Readings from Last 3 Encounters:  08/26/17 173 lb 4 oz (78.6 kg)  07/28/17 174 lb (78.9 kg)  07/28/17 172 lb 8 oz (78.2 kg)      Also now on fenofibrate x 1 month.. Due to recheck trigs.   Review of Systems  Constitutional: Negative for fatigue and fever.  HENT: Negative for congestion and ear pain.   Eyes: Negative for pain.  Respiratory: Negative for cough, chest tightness and shortness of breath.   Cardiovascular: Negative for chest pain, palpitations and leg swelling.  Gastrointestinal: Negative for abdominal pain.  Genitourinary: Negative for dysuria and vaginal bleeding.  Musculoskeletal: Negative for back pain.  Neurological: Negative for syncope, light-headedness and headaches.  Psychiatric/Behavioral: Negative for dysphoric mood.       Objective:   Physical Exam  Constitutional: Vital signs are normal. She appears well-developed and well-nourished. She is cooperative.  Non-toxic appearance. She does not appear ill. No distress.  HENT:  Head: Normocephalic.  Right Ear: Hearing, tympanic membrane, external ear and ear canal normal. Tympanic membrane is not erythematous, not retracted and not bulging.  Left Ear: Hearing, tympanic membrane, external ear and ear canal normal. Tympanic membrane is not erythematous, not retracted and not bulging.  Nose: No mucosal edema or rhinorrhea. Right sinus  exhibits no maxillary sinus tenderness and no frontal sinus tenderness. Left sinus exhibits no maxillary sinus tenderness and no frontal sinus tenderness.  Mouth/Throat: Uvula is midline, oropharynx is clear and moist and mucous membranes are normal.  Eyes: Pupils are equal, round, and reactive to light. Conjunctivae, EOM and lids are normal. Lids are everted and swept, no foreign bodies found.  Neck: Trachea normal and normal range of motion. Neck supple. Carotid bruit is not present. No thyroid mass and no thyromegaly present.  Cardiovascular: Normal rate, regular rhythm, S1 normal, S2 normal, normal heart sounds, intact distal pulses and normal pulses. Exam reveals no gallop and no friction rub.  No murmur heard. Pulmonary/Chest: Effort normal and breath sounds normal. No tachypnea. No respiratory distress. She has no decreased breath sounds. She has no wheezes. She has no rhonchi. She has no rales.  Abdominal: Soft. Normal appearance and bowel sounds are normal. There is no tenderness.  Neurological: She is alert.  Skin: Skin is warm, dry and intact. No rash noted.  Psychiatric: Her speech is normal and behavior is normal. Judgment and thought content normal. Her mood appears not anxious. Cognition and memory are normal. She does not exhibit a depressed mood.          Assessment & Plan:

## 2017-08-26 NOTE — Patient Instructions (Addendum)
Continue current dose of victoza. Continue working on [employer] , low carb and increase in activity.

## 2017-08-26 NOTE — Assessment & Plan Note (Signed)
Very high.. Due for re-eval on fenofibrate x 1 month.

## 2017-10-12 ENCOUNTER — Encounter: Payer: Self-pay | Admitting: Family Medicine

## 2017-10-12 ENCOUNTER — Other Ambulatory Visit: Payer: Self-pay | Admitting: Obstetrics and Gynecology

## 2017-10-12 DIAGNOSIS — N3090 Cystitis, unspecified without hematuria: Secondary | ICD-10-CM

## 2017-10-12 MED ORDER — CIPROFLOXACIN HCL 500 MG PO TABS: 500.0000 mg | ORAL_TABLET | Freq: Every day | ORAL | 0 refills | Status: AC

## 2017-10-12 NOTE — Progress Notes (Signed)
Patient called nurse triage line with UTI and vaginal bleeding. She reported that her vaginal bleeding was sudden onset. She has not had vaginal bleeding since her ablation in August of 2018. She said that she was not filling a pad an hour or passing large clots. She would like to avoid an ER visit at this time. I sent a prescription to her pharmacy for the UTI and asked her to come to the office tomorrow to be evaluated. She will come to the ER tonight if her bleeding becomes heavier.   Adrian Prows MD [facility], [facility] 10/12/17 9:16 PM

## 2017-10-13 ENCOUNTER — Encounter: Payer: Self-pay | Admitting: Obstetrics & Gynecology

## 2017-10-13 ENCOUNTER — Other Ambulatory Visit (INDEPENDENT_AMBULATORY_CARE_PROVIDER_SITE_OTHER): Payer: 59

## 2017-10-13 ENCOUNTER — Ambulatory Visit: Payer: 59 | Admitting: Obstetrics and Gynecology

## 2017-10-13 ENCOUNTER — Ambulatory Visit (INDEPENDENT_AMBULATORY_CARE_PROVIDER_SITE_OTHER): Payer: 59 | Admitting: Obstetrics & Gynecology

## 2017-10-13 ENCOUNTER — Other Ambulatory Visit: Payer: Self-pay | Admitting: Obstetrics & Gynecology

## 2017-10-13 ENCOUNTER — Other Ambulatory Visit: Payer: Self-pay | Admitting: Family Medicine

## 2017-10-13 ENCOUNTER — Telehealth: Payer: Self-pay | Admitting: Family Medicine

## 2017-10-13 ENCOUNTER — Telehealth: Payer: Self-pay | Admitting: Obstetrics and Gynecology

## 2017-10-13 VITALS — BP 130/90 | Ht 66.0 in | Wt 169.0 lb

## 2017-10-13 DIAGNOSIS — N939 Abnormal uterine and vaginal bleeding, unspecified: Secondary | ICD-10-CM | POA: Diagnosis not present

## 2017-10-13 DIAGNOSIS — R300 Dysuria: Secondary | ICD-10-CM

## 2017-10-13 DIAGNOSIS — N938 Other specified abnormal uterine and vaginal bleeding: Secondary | ICD-10-CM | POA: Diagnosis not present

## 2017-10-13 DIAGNOSIS — N83201 Unspecified ovarian cyst, right side: Secondary | ICD-10-CM | POA: Diagnosis not present

## 2017-10-13 LAB — POCT URINALYSIS DIPSTICK
BILIRUBIN UA: NEGATIVE
Blood, UA: POSITIVE
Glucose, UA: NEGATIVE
Ketones, UA: NEGATIVE
Leukocytes, UA: NEGATIVE
Nitrite, UA: NEGATIVE
Protein, UA: NEGATIVE
Spec Grav, UA: 1.01 (ref 1.010–1.025)
Urobilinogen, UA: 0.2 E.U./dL
pH, UA: 5 (ref 5.0–8.0)

## 2017-10-13 MED ORDER — SULFAMETHOXAZOLE-TRIMETHOPRIM 800-160 MG PO TABS
1.0000 | ORAL_TABLET | Freq: Two times a day (BID) | ORAL | 0 refills | Status: DC
Start: 2017-10-13 — End: 2017-10-13

## 2017-10-13 NOTE — Telephone Encounter (Signed)
Called and left voice mail for patient to call back to be schedule °

## 2017-10-13 NOTE — Telephone Encounter (Signed)
-----   Message from Homero Fellers, MD sent at 10/12/2017  9:16 PM EDT ----- Please call this patient and have her added on to the clinic schedule so she can be seen for heavy vaginal bleeding new onset since her uterine ablation and a UTI.  Thank you,  Dr. Gilman Schmidt

## 2017-10-13 NOTE — Telephone Encounter (Signed)
Called and spoke with patient advised to see Dr. Kenton Kingfisher and follow up with ultrasound. Patient is schedule at 3:00

## 2017-10-13 NOTE — Telephone Encounter (Signed)
Thank you Sara!

## 2017-10-13 NOTE — Progress Notes (Signed)
HPI:      [name] is a 47 y.o. [phone] who LMP was No LMP recorded. Patient has had an ablation., presents today for a problem visit.  She complains of one episode of heavy bleeding after almost a year of no bleeding following ablation procedure, this was preceeded for 2 days by severe crampy abdominal pains.  Previous evaluation: treated for menorrhagia last year with work up followed by ablation.  Prior Diagnosis: menorrhagia. Previous Treatment: Endometrial ablation last year.  PMHx: She  has a past medical history of Allergy, Arthritis, Diabetes mellitus without complication ([place]), GERD (gastroesophageal reflux disease), History of kidney stones, Hypertension, and MVP (mitral valve prolapse). Also,  has a past surgical history that includes Tubal ligation; plantar fasciitis release (Bilateral); Knee surgery (Left); cyst removed from ovary; Breast biopsy (Right, 02/2012); Laparoscopic salpingo oophorectomy (Left, 12/23/2016); Dilatation & currettage/hysteroscopy with novasure ablation (N/A, 12/23/2016); and Labioplasty (N/A, 07/12/2017)., family history includes Breast cancer in her maternal aunt and paternal aunt.,  reports that she has been smoking cigarettes.  She has a 4.50 pack-year smoking history. She has never used smokeless tobacco. She reports that she does not drink alcohol or use drugs.  She  Current Outpatient Medications:  .  Blood Glucose Monitoring Suppl (ONE TOUCH ULTRA SYSTEM KIT) w/Device KIT, Use to check blood sugar one to two times daily.  Dx: E11.9, Disp: 1 each, Rfl: 0 .  calcium carbonate (TUMS - DOSED IN MG ELEMENTAL CALCIUM) 500 MG chewable tablet, Chew 1 tablet by mouth as needed for indigestion or heartburn., Disp: , Rfl:  .  ciprofloxacin (CIPRO) 500 MG tablet, Take 1 tablet (500 mg total) by mouth daily for 3 days., Disp: 3 tablet, Rfl: 0 .  fenofibrate 160 MG tablet, Take 1 tablet (160 mg total) by mouth daily., Disp: 30 tablet, Rfl: 11 .  glipiZIDE  (GLUCOTROL XL) 10 MG 24 hr tablet, Take 1 tablet (10 mg total) by mouth daily with breakfast., Disp: 90 tablet, Rfl: 3 .  glucose blood (ONE TOUCH ULTRA TEST) test strip, Use to check blood sugar one to two times daily.  Dx: E11.9, Disp: 200 each, Rfl: 3 .  ibuprofen (ADVIL,MOTRIN) 200 MG tablet, Take 400 mg by mouth every 8 (eight) hours as needed for mild pain or moderate pain. , Disp: , Rfl:  .  Insulin Pen Needle 31G X 8 MM MISC, Use to inject Victoza daily.  Dx: E11.65, Disp: 90 each, Rfl: 3 .  liraglutide (VICTOZA) 18 MG/3ML SOPN, Inject 0.1 mLs (0.6 mg total) into the skin daily., Disp: 3 mL, Rfl: 3 .  losartan-hydrochlorothiazide (HYZAAR) 50-12.5 MG tablet, Take 1 tablet by mouth daily., Disp: 90 tablet, Rfl: 3 .  meloxicam (MOBIC) 15 MG tablet, Take 1 tablet (15 mg total) by mouth 2 (two) times daily as needed for pain., Disp: 20 tablet, Rfl: 1 .  metFORMIN (GLUCOPHAGE) 1000 MG tablet, TAKE 1 TABLET BY MOUTH TWO  TIMES DAILY WITH A MEAL, Disp: 180 tablet, Rfl: 3 .  Multiple Vitamin (MULTIVITAMIN WITH MINERALS) TABS tablet, Take 2 tablets by mouth daily with lunch., Disp: , Rfl:  .  ONETOUCH DELICA LANCETS 33A MISC, Use to check blood sugar one to two times daily.  Dx: E11.9, Disp: 200 each, Rfl: 3 .  valACYclovir (VALTREX) 1000 MG tablet, Take 1 tablet (1,000 mg total) 2 (two) times daily by mouth., Disp: 14 tablet, Rfl: 1 .  venlafaxine XR (EFFEXOR-XR) 37.5 MG 24 hr capsule, 1 tab po at  bedtime, Disp: 90 capsule, Rfl: 1  Also, is allergic to penicillins; keflex [cephalexin]; and methyldopa.  ROS  Objective: BP 130/90   Ht _0  (1.676 m)   Wt 169 lb (76.7 kg)   BMI 27.28 kg/m  OBGyn Exam  Results for orders placed or performed in visit on 10/13/17  POCT urinalysis dipstick  Result Value Ref Range   Color, UA     Clarity, UA     Glucose, UA Negative Negative   Bilirubin, UA neg    Ketones, UA neg    Spec Grav, UA 1.010 1.010 - 1.025   Blood, UA positive    pH, UA 5.0 5.0 -  8.0   Protein, UA Negative Negative   Urobilinogen, UA 0.2 0.2 or 1.0 E.U./dL   Nitrite, UA neg    Leukocytes, UA Negative Negative   Appearance     Odor     ASSESSMENT/PLAN:  dysfunctional uterine bleeding  Problem List Items Addressed This Visit      Other   Dysfunctional uterine bleeding   Relevant Orders   US PELVIS TRANSVANGINAL NON-OB (TV ONLY)    Other Visit Diagnoses    Dysuria    -  Primary   Relevant Orders   POCT urinalysis dipstick (Completed)   Right ovarian cyst       Relevant Orders   US PELVIS TRANSVANGINAL NON-OB (TV ONLY)    Patient has abnormal uterine bleeding . She has a normal exam today, with no evidence of lesions.  Evaluation includes the following: exam, labs such as hormonal testing, and pelvic ultrasound to evaluate for any structural gynecologic abnormalities.  Patient to follow up after testing.  Treatment option for menorrhagia or menometrorrhagia discussed in great detail with the patient.  Options include hormonal therapy, IUD therapy such as Mirena, D&C, Ablation, and Hysterectomy.  The pros and cons of each option discussed with patient.  Already has had ablation.  COnsidering hysterectomy, unless this is a one time symptom of breakthru bleeding and pain.  Monitor ovarian cyst as it has risk for torsion, discussed.  [place] 6 weeks to assess size and nature.  Barnett Applebaum, MD, [facility], [facility] 10/13/2017  5:13 PM

## 2017-10-13 NOTE — Telephone Encounter (Signed)
-----   Message from Gae Dry, MD sent at 10/13/2017  7:35 AM EDT ----- Regarding: APpt Please work in with me today [place] first if possible, otherwise not  ----- Message ----- From: Homero Fellers, MD Sent: 10/12/2017   9:16 PM To: Gae Dry, MD, [facility]  Please call this patient and have her added on to the clinic schedule so she can be seen for heavy vaginal bleeding new onset since her uterine ablation and a UTI.  Thank you,  Dr. Gilman Schmidt

## 2017-10-13 NOTE — Telephone Encounter (Signed)
Left message at [facility] on [address] asking them to cancel the Bactrim Rx just sent in by Dr. Diona Browner.

## 2017-10-13 NOTE — Telephone Encounter (Signed)
Butch Penny.. Please call  [facility] [address] and cancel the order for the bactrim... I sent it in before I saw that she was seen by GYN.

## 2017-10-26 ENCOUNTER — Other Ambulatory Visit: Payer: Self-pay

## 2017-10-26 ENCOUNTER — Emergency Department
Admission: EM | Admit: 2017-10-26 | Discharge: 2017-10-26 | Disposition: A | Discharge status: Home / Self Care | Payer: Commercial Managed Care - HMO | Attending: Student in an Organized Health Care Education/Training Program | Admitting: Student in an Organized Health Care Education/Training Program

## 2017-10-26 ENCOUNTER — Encounter: Payer: Self-pay | Admitting: Emergency Medicine

## 2017-10-26 ENCOUNTER — Telehealth: Payer: Self-pay

## 2017-10-26 ENCOUNTER — Emergency Department: Payer: Commercial Managed Care - HMO

## 2017-10-26 DIAGNOSIS — E785 Hyperlipidemia, unspecified: Secondary | ICD-10-CM | POA: Insufficient documentation

## 2017-10-26 DIAGNOSIS — R102 Pelvic and perineal pain: Secondary | ICD-10-CM | POA: Insufficient documentation

## 2017-10-26 DIAGNOSIS — E1169 Type 2 diabetes mellitus with other specified complication: Secondary | ICD-10-CM | POA: Diagnosis not present

## 2017-10-26 DIAGNOSIS — I10 Essential (primary) hypertension: Secondary | ICD-10-CM | POA: Insufficient documentation

## 2017-10-26 DIAGNOSIS — R935 Abnormal findings on diagnostic imaging of other abdominal regions, including retroperitoneum: Secondary | ICD-10-CM | POA: Diagnosis not present

## 2017-10-26 DIAGNOSIS — F17210 Nicotine dependence, cigarettes, uncomplicated: Secondary | ICD-10-CM | POA: Insufficient documentation

## 2017-10-26 DIAGNOSIS — Z794 Long term (current) use of insulin: Secondary | ICD-10-CM | POA: Insufficient documentation

## 2017-10-26 DIAGNOSIS — Z79899 Other long term (current) drug therapy: Secondary | ICD-10-CM | POA: Insufficient documentation

## 2017-10-26 LAB — COMPREHENSIVE METABOLIC PANEL
ALT: 28 U/L (ref 0–44)
AST: 33 U/L (ref 15–41)
Albumin: 4.5 g/dL (ref 3.5–5.0)
Alkaline Phosphatase: 49 U/L (ref 38–126)
Anion gap: 10 (ref 5–15)
BILIRUBIN TOTAL: 1 mg/dL (ref 0.3–1.2)
BUN: 14 mg/dL (ref 6–20)
CALCIUM: 9.8 mg/dL (ref 8.9–10.3)
CO2: 26 mmol/L (ref 22–32)
Chloride: 104 mmol/L (ref 98–111)
Creatinine, Ser: 0.67 mg/dL (ref 0.44–1.00)
Glucose, Bld: 122 mg/dL — ABNORMAL HIGH (ref 70–99)
Potassium: 3.4 mmol/L — ABNORMAL LOW (ref 3.5–5.1)
Sodium: 140 mmol/L (ref 135–145)
TOTAL PROTEIN: 7.5 g/dL (ref 6.5–8.1)

## 2017-10-26 LAB — CBC WITH DIFFERENTIAL/PLATELET
BASOS ABS: 0 10*3/uL (ref 0–0.1)
Basophils Relative: 0 %
EOS PCT: 2 %
Eosinophils Absolute: 0.1 10*3/uL (ref 0–0.7)
HEMATOCRIT: 42.9 % (ref 35.0–47.0)
Hemoglobin: 14.6 g/dL (ref 12.0–16.0)
LYMPHS ABS: 2.4 10*3/uL (ref 1.0–3.6)
LYMPHS PCT: 39 %
MCH: 31.3 pg (ref 26.0–34.0)
MCHC: 33.9 g/dL (ref 32.0–36.0)
MCV: 92.3 fL (ref 80.0–100.0)
MONO ABS: 0.5 10*3/uL (ref 0.2–0.9)
Monocytes Relative: 8 %
NEUTROS ABS: 3.1 10*3/uL (ref 1.4–6.5)
Neutrophils Relative %: 51 %
Platelets: 283 10*3/uL (ref 150–440)
RBC: 4.65 MIL/uL (ref 3.80–5.20)
RDW: 13.4 % (ref 11.5–14.5)
WBC: 6.1 10*3/uL (ref 3.6–11.0)

## 2017-10-26 MED ORDER — HYDROCODONE-ACETAMINOPHEN 5-325 MG PO TABS
1.0000 | ORAL_TABLET | Freq: Once | ORAL | Status: AC
  Administered 2017-10-26: 1 via ORAL
  Filled 2017-10-26: qty 1

## 2017-10-26 MED ORDER — KETOROLAC TROMETHAMINE 30 MG/ML IJ SOLN
15.0000 mg | Freq: Once | INTRAMUSCULAR | Status: AC
  Administered 2017-10-26: 15 mg via INTRAVENOUS
  Filled 2017-10-26: qty 1

## 2017-10-26 MED ORDER — HYDROCODONE-ACETAMINOPHEN 5-325 MG PO TABS: 1.0000 | ORAL_TABLET | ORAL | 0 refills | Status: DC | PRN

## 2017-10-26 MED ORDER — FENTANYL CITRATE (PF) 100 MCG/2ML IJ SOLN
50.0000 ug | INTRAMUSCULAR | Status: DC | PRN
Start: 2017-10-26 — End: 2017-10-27
  Administered 2017-10-26: 50 ug via INTRAVENOUS
  Filled 2017-10-26: qty 2

## 2017-10-26 NOTE — Telephone Encounter (Signed)
D/w pt. To ER for evl and [place]

## 2017-10-26 NOTE — ED Provider Notes (Signed)
[facility] Emergency Department Provider Note    First MD Initiated Contact with Patient 10/26/17 1911     (approximate)  I have reviewed the triage vital signs and the nursing notes.   HISTORY  Chief Complaint Abdominal Pain    HPI Alyssa Warren is a 47 y.o. female with a history of known ovarian cysts with currently a known large 5.5 cm right ovarian cyst presents with acute onset of left-sided abdominal pain radiating low back.  No fevers.  Has had cyst surgically excised previously.  Denies any fevers.  No nausea or vomiting the pain is moderate to severe not improving with home medications.  Denies any diarrhea or constipation.  No blood in her stools.  No vaginal bleeding or discharge.    Past Medical History:  Diagnosis Date  . Allergy   . Arthritis    LEFT KNEE  . Diabetes mellitus without complication ([place])   . GERD (gastroesophageal reflux disease)    OCC  . History of kidney stones    H/O  . Hypertension   . MVP (mitral valve prolapse)    Family History  Problem Relation Age of Onset  . Breast cancer Maternal Aunt        great Aunts   . Breast cancer Paternal Aunt    Past Surgical History:  Procedure Laterality Date  . BREAST BIOPSY Right 02/2012   x2. benign  . cyst removed from ovary    . DILITATION & CURRETTAGE/HYSTROSCOPY WITH NOVASURE ABLATION N/A 12/23/2016   Procedure: DILATATION & CURETTAGE/HYSTEROSCOPY ENDOMETRIAL ABLATION-MINERVA;  Surgeon: Gae Dry, MD;  Location: [facility] ORS;  Service: Gynecology;  Laterality: N/A;  . KNEE SURGERY Left   . LABIOPLASTY N/A 07/12/2017   Procedure: EXCISION OF LABIAL CYST;  Surgeon: Gae Dry, MD;  Location: [facility] ORS;  Service: Gynecology;  Laterality: N/A;  . LAPAROSCOPIC SALPINGO OOPHERECTOMY Left 12/23/2016   Procedure: LAPAROSCOPIC SALPINGO OOPHORECTOMY;  Surgeon: Gae Dry, MD;  Location: [facility] ORS;  Service: Gynecology;  Laterality: Left;  . plantar  fasciitis release Bilateral   . TUBAL LIGATION     Patient Active Problem List   Diagnosis Date Noted  . Dysfunctional uterine bleeding 10/13/2017  . Neuralgia 03/07/2017  . Menorrhagia 12/23/2016  . Cervical radiculopathy 07/06/2016  . Left sided abdominal pain 03/30/2016  . Cystic acne vulgaris 08/12/2015  . Hyperlipidemia associated with type 2 diabetes mellitus ([place]) 12/13/2014  . Trigeminal nerve disorder 12/06/2014  . Family history of breast cancer-remote 07/27/2013  . Poorly controlled type 2 diabetes mellitus ([place]) 07/06/2013  . History of breast lump/mass excision 09/26/2012  . Breast screening 09/26/2012  . Major depressive disorder, single episode, mild ([place]) 03/02/2012  . High triglycerides 11/18/2010  . Essential hypertension, benign 11/20/2008  . ALLERGIC RHINITIS 03/06/2007      Prior to Admission medications   Medication Sig Start Date End Date Taking? Authorizing Provider  Blood Glucose Monitoring Suppl (ONE TOUCH ULTRA SYSTEM KIT) w/Device KIT Use to check blood sugar one to two times daily.  Dx: E11.9 08/13/16   Jinny Sanders, MD  calcium carbonate (TUMS - DOSED IN MG ELEMENTAL CALCIUM) 500 MG chewable tablet Chew 1 tablet by mouth as needed for indigestion or heartburn.    [provider]  fenofibrate 160 MG tablet Take 1 tablet (160 mg total) by mouth daily. 07/22/17   Bedsole, Amy E, MD  glipiZIDE (GLUCOTROL XL) 10 MG 24 hr tablet Take 1 tablet (10 mg total)  by mouth daily with breakfast. 07/28/17   Bedsole, Amy E, MD  glucose blood (ONE TOUCH ULTRA TEST) test strip Use to check blood sugar one to two times daily.  Dx: E11.9 08/13/16   Jinny Sanders, MD  ibuprofen (ADVIL,MOTRIN) 200 MG tablet Take 400 mg by mouth every 8 (eight) hours as needed for mild pain or moderate pain.     [provider]  Insulin Pen Needle 31G X 8 MM MISC Use to inject Victoza daily.  Dx: E11.65 07/28/17   Jinny Sanders, MD  liraglutide (VICTOZA) 18 MG/3ML SOPN Inject  0.1 mLs (0.6 mg total) into the skin daily. 07/28/17   Bedsole, Amy E, MD  losartan-hydrochlorothiazide (HYZAAR) 50-12.5 MG tablet Take 1 tablet by mouth daily. 07/28/17   Bedsole, Amy E, MD  meloxicam (MOBIC) 15 MG tablet Take 1 tablet (15 mg total) by mouth 2 (two) times daily as needed for pain. 07/12/17   Gae Dry, MD  metFORMIN (GLUCOPHAGE) 1000 MG tablet TAKE 1 TABLET BY MOUTH TWO  TIMES DAILY WITH A MEAL 07/28/17   Bedsole, Amy E, MD  Multiple Vitamin (MULTIVITAMIN WITH MINERALS) TABS tablet Take 2 tablets by mouth daily with lunch.    [provider]  [facility] DELICA LANCETS 60F MISC Use to check blood sugar one to two times daily.  Dx: E11.9 08/13/16   Jinny Sanders, MD  valACYclovir (VALTREX) 1000 MG tablet Take 1 tablet (1,000 mg total) 2 (two) times daily by mouth. 03/07/17   Venia Carbon, MD  venlafaxine XR (EFFEXOR-XR) 37.5 MG 24 hr capsule 1 tab po at bedtime 07/28/17   Jinny Sanders, MD    Allergies Penicillins; Keflex [cephalexin]; and Methyldopa    Social History Social History   Tobacco Use  . Smoking status: Current Some Day Smoker    Packs/day: 0.30    Years: 15.00    Pack years: 4.50    Types: Cigarettes    Last attempt to quit: 12/19/2016    Years since quitting: 0.8  . Smokeless tobacco: Never Used  Substance Use Topics  . Alcohol use: No    Alcohol/week: 0.0 oz  . Drug use: No    Review of Systems Patient denies headaches, rhinorrhea, blurry vision, numbness, shortness of breath, chest pain, edema, cough, abdominal pain, nausea, vomiting, diarrhea, dysuria, fevers, rashes or hallucinations unless otherwise stated above in HPI. ____________________________________________   PHYSICAL EXAM:  VITAL SIGNS: Vitals:   10/26/17 1847  BP: (!) 162/104  Pulse: 99  Temp: 98.4 F (36.9 C)  SpO2: 97%    Constitutional: Alert and oriented.  Eyes: Conjunctivae are normal.  Head: Atraumatic. Nose: No congestion/rhinnorhea. Mouth/Throat:  Mucous membranes are moist.   Neck: No stridor. Painless ROM.  Cardiovascular: Normal rate, regular rhythm. Grossly normal heart sounds.  Good peripheral circulation. Respiratory: Normal respiratory effort.  No retractions. Lungs CTAB. Gastrointestinal: Soft and with mild ttp in llqNo distention. No abdominal bruits. No CVA tenderness. Genitourinary: deferred Musculoskeletal: No lower extremity tenderness nor edema.  No joint effusions. Neurologic:  Normal speech and language. No gross focal neurologic deficits are appreciated. No facial droop Skin:  Skin is warm, dry and intact. No rash noted. Psychiatric: Mood and affect are normal. Speech and behavior are normal.  ____________________________________________   LABS (all labs ordered are listed, but only abnormal results are displayed)  No results found for this or any previous visit (from the past 24 hour(s)). __________________________________________________________________________________  RADIOLOGY  I personally reviewed all  radiographic images ordered to evaluate for the above acute complaints and reviewed radiology reports and findings.  These findings were personally discussed with the patient.  Please see medical record for radiology report.  ____________________________________________   PROCEDURES  Procedure(s) performed:  Procedures    Critical Care performed: no ____________________________________________   INITIAL IMPRESSION / ASSESSMENT AND PLAN / ED COURSE  Pertinent labs & imaging results that were available during my care of the patient were reviewed by me and considered in my medical decision making (see chart for details).   DDX: Torsion, cyst, mass, UTI, Pilo, colitis  Aithana Kushner is a 47 y.o. who presents to the ED with pain as described above.  Blood work sent for the above differential was reassuring.  Patient presentation pelvic ultrasound ordered to evaluate for torsion, mass complex  cyst.  Discussed ultrasound results with Dr. Nechama Guard of OB/GYN particularly increasing endometrial thickness. Concerning for malignancy/polyp and will need further workup in clinic. Patient does not have any active bleeding at this time I do believe that she stable and appropriate for outpatient follow-up.  She is established with Dr. Kenton Kingfisher.  Do not feel based on her presentation or exam that she requires any additional diagnostic testing at this time.      As part of my medical decision making, I reviewed the following data within the [facility] notes reviewed and incorporated, Labs reviewed, notes from prior ED visits.  ____________________________________________   FINAL CLINICAL IMPRESSION(S) / ED DIAGNOSES  Final diagnoses:  Pelvic pain  Abnormal ultrasound of uterus      NEW MEDICATIONS STARTED DURING THIS VISIT:  New Prescriptions   No medications on file     Note:  This document was prepared using Dragon voice recognition software and may include unintentional dictation errors.    Merlyn Lot, MD 10/26/17 2229

## 2017-10-26 NOTE — Telephone Encounter (Signed)
Pt saw [facility] 10/13/17 & has a cyst. She is in severe pain today & feeling like she did on 6/19 when she bleed a lot. Thinks she is about to have another episode. Uncertain what to do. Cb#[phone]

## 2017-10-26 NOTE — Discharge Instructions (Addendum)

## 2017-10-26 NOTE — Telephone Encounter (Signed)
Should she go to the ER?  Pt states pain is really bad she had to leave work. Pt aware either you or I would call her back today. Please advise.

## 2017-10-26 NOTE — ED Triage Notes (Signed)
Pt with 9/10 abd pain 9/10 that started approx. 1200. Pt with known 5.5 cm ovarian cyst and has had pain on and off ever since. Pt called [facility] and they said come here.

## 2017-10-28 ENCOUNTER — Telehealth: Payer: Self-pay | Admitting: Obstetrics & Gynecology

## 2017-10-28 NOTE — Telephone Encounter (Signed)
Patient is aware of H&P at [facility] on 10/31/17 @ 1:15pm w/ Dr. Kenton Kingfisher, Pre-admit Testing phone interview to be scheduled, and OR on 11/01/17. Patient is aware she may receive calls from the [facility] Patient confirmed [employer], and no secondary policies. Patient is aware I have already submitted an authorization request, but must wait for the request to be assigned and to be contacted for clinicals, and there is a possibility we will not have authorization by Tuesday. Pending auth# [id number].

## 2017-10-28 NOTE — Telephone Encounter (Signed)
Pre-admit Testing cannot accommodate the request for a phone interview on Monday, and patient is scheduled for an office appointment at 2:30pm. Lmtrc.

## 2017-10-28 NOTE — Telephone Encounter (Signed)
Patient is aware of Pre-admit Testing appointment.

## 2017-10-28 NOTE — Telephone Encounter (Signed)
Auth# [id number] has been approved and patient is aware.

## 2017-10-28 NOTE — Telephone Encounter (Signed)
-----   Message from Gae Dry, MD sent at 10/27/2017  7:40 AM EDT ----- Regarding: Surgery Surgery Booking Request Patient Full Name:  Alyssa Warren  MRN: [id number]  DOB: 06/18/1970  Surgeon: Hoyt Koch, MD  Requested Surgery Date and Time: 11/01/17 Primary Diagnosis AND Code: Pelvic pain Secondary Diagnosis and Code:  Surgical Procedure: TLH/BS L&D Notification: No Admission Status: same day surgery Length of Surgery: 1 hr Special Case Needs: no H&P: yes- Monday July 8th at 115 - plz schedule Phone Interview???: yes Interpreter: Language:  Medical Clearance: no Special Scheduling Instructions: no

## 2017-10-31 ENCOUNTER — Ambulatory Visit (INDEPENDENT_AMBULATORY_CARE_PROVIDER_SITE_OTHER): Payer: 59 | Admitting: Obstetrics & Gynecology

## 2017-10-31 ENCOUNTER — Other Ambulatory Visit: Payer: Self-pay

## 2017-10-31 ENCOUNTER — Encounter: Payer: Self-pay | Admitting: Obstetrics & Gynecology

## 2017-10-31 ENCOUNTER — Encounter
Admission: RE | Admit: 2017-10-31 | Discharge: 2017-10-31 | Disposition: A | Discharge status: Home / Self Care | Payer: Commercial Managed Care - HMO | Source: Ambulatory Visit | Attending: Obstetrics & Gynecology | Admitting: Obstetrics & Gynecology

## 2017-10-31 VITALS — BP 132/90 | Ht 66.0 in | Wt 166.0 lb

## 2017-10-31 DIAGNOSIS — I341 Nonrheumatic mitral (valve) prolapse: Secondary | ICD-10-CM | POA: Diagnosis not present

## 2017-10-31 DIAGNOSIS — N83201 Unspecified ovarian cyst, right side: Secondary | ICD-10-CM

## 2017-10-31 DIAGNOSIS — N938 Other specified abnormal uterine and vaginal bleeding: Secondary | ICD-10-CM

## 2017-10-31 DIAGNOSIS — Z79899 Other long term (current) drug therapy: Secondary | ICD-10-CM | POA: Diagnosis not present

## 2017-10-31 DIAGNOSIS — Z87442 Personal history of urinary calculi: Secondary | ICD-10-CM | POA: Diagnosis not present

## 2017-10-31 DIAGNOSIS — N939 Abnormal uterine and vaginal bleeding, unspecified: Secondary | ICD-10-CM | POA: Diagnosis present

## 2017-10-31 DIAGNOSIS — I10 Essential (primary) hypertension: Secondary | ICD-10-CM | POA: Diagnosis not present

## 2017-10-31 DIAGNOSIS — K219 Gastro-esophageal reflux disease without esophagitis: Secondary | ICD-10-CM | POA: Diagnosis not present

## 2017-10-31 DIAGNOSIS — R102 Pelvic and perineal pain: Secondary | ICD-10-CM

## 2017-10-31 DIAGNOSIS — R109 Unspecified abdominal pain: Secondary | ICD-10-CM

## 2017-10-31 DIAGNOSIS — E119 Type 2 diabetes mellitus without complications: Secondary | ICD-10-CM | POA: Diagnosis not present

## 2017-10-31 DIAGNOSIS — Z794 Long term (current) use of insulin: Secondary | ICD-10-CM | POA: Diagnosis not present

## 2017-10-31 DIAGNOSIS — D251 Intramural leiomyoma of uterus: Secondary | ICD-10-CM | POA: Diagnosis not present

## 2017-10-31 DIAGNOSIS — Z881 Allergy status to other antibiotic agents status: Secondary | ICD-10-CM | POA: Diagnosis not present

## 2017-10-31 DIAGNOSIS — Z880 Allergy status to penicillin: Secondary | ICD-10-CM | POA: Diagnosis not present

## 2017-10-31 DIAGNOSIS — F17210 Nicotine dependence, cigarettes, uncomplicated: Secondary | ICD-10-CM | POA: Diagnosis not present

## 2017-10-31 HISTORY — DX: Polycystic ovarian syndrome: E28.2

## 2017-10-31 LAB — TYPE AND SCREEN
ABO/RH(D): A POS
Antibody Screen: NEGATIVE

## 2017-10-31 NOTE — Patient Instructions (Signed)
Your procedure is scheduled on: Tomorrow Report to Day Surgery.10:00.  Remember: Instructions that are not followed completely may result in serious medical risk,  up to and including death, or upon the discretion of your surgeon and anesthesiologist your  surgery may need to be rescheduled.     _X__ 1. Do not eat food after midnight the night before your procedure.                 No gum chewing or hard candies. You may drink clear liquids up to 2 hours                 before you are scheduled to arrive for your surgery- DO not drink clear                 liquids within 2 hours of the start of your surgery.                 Clear Liquids include:  water, apple juice without pulp, clear carbohydrate                 drink such as Clearfast of Gatorade, Black Coffee or Tea (Do not add                 anything to coffee or tea).  __X__2.  On the morning of surgery brush your teeth with toothpaste and water, you                may rinse your mouth with mouthwash if you wish.  Do not swallow any toothpaste of mouthwash.     _X__ 3.  No Alcohol for 24 hours before or after surgery.   _X__ 4.  Do Not Smoke or use e-cigarettes For 24 Hours Prior to Your Surgery.                 Do not use any chewable tobacco products for at least 6 hours prior to                 surgery.  ____  5.  Bring all medications with you on the day of surgery if instructed.   ____  6.  Notify your doctor if there is any change in your medical condition      (cold, fever, infections).     Do not wear jewelry, make-up, hairpins, clips or nail polish. Do not wear lotions, powders, or perfumes. You may wear deodorant. Do not shave 48 hours prior to surgery. Men may shave face and neck. Do not bring valuables to the hospital.    [facility] is not responsible for any belongings or valuables.  Contacts, dentures or bridgework may not be worn into surgery. Leave your suitcase in the car. After  surgery it may be brought to your room. For patients admitted to the hospital, discharge time is determined by your treatment team.   Patients discharged the day of surgery will not be allowed to drive home.   Please read over the following fact sheets that you were given:    __x__ Take these medicines the morning of surgery with A SIP OF WATER:    1. HYDROcodone-acetaminophen (NORCO) 5-325 MG tablet if needed  2. fenofibrate 160 MG tablet  3.   4.  5.  6.  ____ Fleet Enema (as directed)   __x__ Use CHG Soap as directed  ____ Use inhalers on the day of surgery  _x___ Stop metformin 2 days prior to  surgery stopped yesterday    ____ Take 1/2 of usual insulin dose the night before surgery. No insulin the morning          of surgery.   ____ Stop Coumadin/Plavix/aspirin on   ____ Stop Anti-inflammatories on    ____ Stop supplements until after surgery.    ____ Bring C-Pap to the hospital.

## 2017-10-31 NOTE — Patient Instructions (Signed)
Total Laparoscopic Hysterectomy °A total laparoscopic hysterectomy is a minimally invasive surgery to remove your uterus and cervix. This surgery is performed by making several small cuts (incisions) in your abdomen. It can also be done with a thin, lighted tube (laparoscope) inserted into two small incisions in your lower abdomen. Your fallopian tubes and ovaries can be removed (bilateral salpingo-oophorectomy) during this surgery as well. Benefits of minimally invasive surgery include: °· Less pain. °· Less risk of blood loss. °· Less risk of infection. °· Quicker return to normal activities. ° °Tell a health care provider about: °· Any allergies you have. °· All medicines you are taking, including vitamins, herbs, eye drops, creams, and over-the-counter medicines. °· Any problems you or family members have had with anesthetic medicines. °· Any blood disorders you have. °· Any surgeries you have had. °· Any medical conditions you have. °What are the risks? °Generally, this is a safe procedure. However, as with any procedure, complications can occur. Possible complications include: °· Bleeding. °· Blood clots in the legs or lung. °· Infection. °· Injury to surrounding organs. °· Problems with anesthesia. °· Early menopause symptoms (hot flashes, night sweats, insomnia). °· Risk of conversion to an open abdominal incision. ° °What happens before the procedure? °· Ask your health care provider about changing or stopping your regular medicines. °· Do not take aspirin or blood thinners (anticoagulants) for 1 week before the surgery or as told by your health care provider. °· Do not eat or drink anything for 8 hours before the surgery or as told by your health care provider. °· Quit smoking if you smoke. °· Arrange for a ride home after surgery and for someone to help you at home during recovery. °What happens during the procedure? °· You will be given antibiotic medicine. °· An IV tube will be placed in your arm. You  will be given medicine to make you sleep (general anesthetic). °· A gas (carbon dioxide) will be used to inflate your abdomen. This will allow your surgeon to look inside your abdomen, perform your surgery, and treat any other problems found if necessary. °· Three or four small incisions (often less than 1/2 inch) will be made in your abdomen. One of these incisions will be made in the area of your belly button (navel). The laparoscope will be inserted into the incision. Your surgeon will look through the laparoscope while doing your procedure. °· Other surgical instruments will be inserted through the other incisions. °· Your uterus may be removed through your vagina or cut into small pieces and removed through the small incisions. °· Your incisions will be closed. °What happens after the procedure? °· The gas will be released from inside your abdomen. °· You will be taken to the recovery area where a nurse will watch and check your progress. Once you are awake, stable, and taking fluids well, without other problems, you will return to your room or be allowed to go home. °· There is usually minimal discomfort following the surgery because the incisions are so small. °· You will be given pain medicine while you are in the hospital and for when you go home. °This information is not intended to replace advice given to you by your health care provider. Make sure you discuss any questions you have with your health care provider. °Document Released: 02/07/2007 Document Revised: 09/18/2015 Document Reviewed: 10/31/2012 °[employer] Interactive Patient Education © 2017 [employer] ° °

## 2017-10-31 NOTE — H&P (View-Only) (Signed)
PRE-OPERATIVE HISTORY AND PHYSICAL EXAM  HPI:  Alyssa Warren is a 47 y.o. [phone] No LMP recorded. Patient has had an ablation.; she is being admitted for surgery related to pelvic pain.  Pt has had prior ablation and had done well w no bleeding until recent heavy bleeding episode but more concerning to her was severe lower abdominal pains assoc w nausea and no radiation and no modifiers, for the last few weeks.  Started up again a few days ago in severe range again.  [place] and labs done.  Small cyst on right ovary but pain is more central and left sided.  Fluid in endometrium [place] consistant w prior ablation and possible hematocolpos to contribute to pain.  PMHx: Past Medical History:  Diagnosis Date  . Allergy   . Arthritis    LEFT KNEE  . Diabetes mellitus without complication ([place])   . GERD (gastroesophageal reflux disease)    OCC  . History of kidney stones    H/O  . Hypertension   . MVP (mitral valve prolapse)    Past Surgical History:  Procedure Laterality Date  . BREAST BIOPSY Right 02/2012   x2. benign  . cyst removed from ovary    . DILITATION & CURRETTAGE/HYSTROSCOPY WITH NOVASURE ABLATION N/A 12/23/2016   Procedure: DILATATION & CURETTAGE/HYSTEROSCOPY ENDOMETRIAL ABLATION-MINERVA;  Surgeon: Gae Dry, MD;  Location: [facility] ORS;  Service: Gynecology;  Laterality: N/A;  . KNEE SURGERY Left   . LABIOPLASTY N/A 07/12/2017   Procedure: EXCISION OF LABIAL CYST;  Surgeon: Gae Dry, MD;  Location: [facility] ORS;  Service: Gynecology;  Laterality: N/A;  . LAPAROSCOPIC SALPINGO OOPHERECTOMY Left 12/23/2016   Procedure: LAPAROSCOPIC SALPINGO OOPHORECTOMY;  Surgeon: Gae Dry, MD;  Location: [facility] ORS;  Service: Gynecology;  Laterality: Left;  . plantar fasciitis release Bilateral   . TUBAL LIGATION     Family History  Problem Relation Age of Onset  . Breast cancer Maternal Aunt        great Aunts   . Breast cancer Paternal Aunt    Social History   Tobacco  Use  . Smoking status: Current Some Day Smoker    Packs/day: 0.30    Years: 15.00    Pack years: 4.50    Types: Cigarettes    Last attempt to quit: 12/19/2016    Years since quitting: 0.8  . Smokeless tobacco: Never Used  Substance Use Topics  . Alcohol use: No    Alcohol/week: 0.0 oz  . Drug use: No    Current Outpatient Medications:  .  acetaminophen (TYLENOL) 500 MG tablet, Take 1,000 mg by mouth 2 (two) times daily as needed for moderate pain., Disp: , Rfl:  .  Blood Glucose Monitoring Suppl (ONE TOUCH ULTRA SYSTEM KIT) w/Device KIT, Use to check blood sugar one to two times daily.  Dx: E11.9, Disp: 1 each, Rfl: 0 .  fenofibrate 160 MG tablet, Take 1 tablet (160 mg total) by mouth daily., Disp: 30 tablet, Rfl: 11 .  glipiZIDE (GLUCOTROL XL) 10 MG 24 hr tablet, Take 1 tablet (10 mg total) by mouth daily with breakfast. (Patient taking differently: Take 10 mg by mouth at bedtime. ), Disp: 90 tablet, Rfl: 3 .  glucose blood (ONE TOUCH ULTRA TEST) test strip, Use to check blood sugar one to two times daily.  Dx: E11.9, Disp: 200 each, Rfl: 3 .  HYDROcodone-acetaminophen (NORCO) 5-325 MG tablet, Take 1 tablet by mouth every 4 (four) hours as needed  for moderate pain., Disp: 6 tablet, Rfl: 0 .  Insulin Pen Needle 31G X 8 MM MISC, Use to inject Victoza daily.  Dx: E11.65, Disp: 90 each, Rfl: 3 .  liraglutide (VICTOZA) 18 MG/3ML SOPN, Inject 0.1 mLs (0.6 mg total) into the skin daily. (Patient taking differently: Inject 0.6 mg into the skin at bedtime. ), Disp: 3 mL, Rfl: 3 .  losartan-hydrochlorothiazide (HYZAAR) 50-12.5 MG tablet, Take 1 tablet by mouth daily. (Patient taking differently: Take 1 tablet by mouth daily with lunch. ), Disp: 90 tablet, Rfl: 3 .  meloxicam (MOBIC) 15 MG tablet, Take 1 tablet (15 mg total) by mouth 2 (two) times daily as needed for pain. (Patient not taking: Reported on 10/28/2017), Disp: 20 tablet, Rfl: 1 .  metFORMIN (GLUCOPHAGE) 1000 MG tablet, TAKE 1 TABLET BY  MOUTH TWO  TIMES DAILY WITH A MEAL, Disp: 180 tablet, Rfl: 3 .  Multiple Vitamin (MULTIVITAMIN WITH MINERALS) TABS tablet, Take 2 tablets by mouth daily with lunch., Disp: , Rfl:  .  ONETOUCH DELICA LANCETS 70B MISC, Use to check blood sugar one to two times daily.  Dx: E11.9, Disp: 200 each, Rfl: 3 .  valACYclovir (VALTREX) 1000 MG tablet, Take 1 tablet (1,000 mg total) 2 (two) times daily by mouth. (Patient not taking: Reported on 10/28/2017), Disp: 14 tablet, Rfl: 1 .  venlafaxine XR (EFFEXOR-XR) 37.5 MG 24 hr capsule, 1 tab po at bedtime (Patient taking differently: Take 37.5 mg by mouth daily with lunch. ), Disp: 90 capsule, Rfl: 1 Allergies: Penicillins; Keflex [cephalexin]; and Methyldopa  Review of Systems  Constitutional: Negative for chills, fever and malaise/fatigue.  HENT: Negative for congestion, sinus pain and sore throat.   Eyes: Negative for blurred vision and pain.  Respiratory: Negative for cough and wheezing.   Cardiovascular: Negative for chest pain and leg swelling.  Gastrointestinal: Negative for abdominal pain, constipation, diarrhea, heartburn, nausea and vomiting.  Genitourinary: Negative for dysuria, frequency, hematuria and urgency.  Musculoskeletal: Negative for back pain, joint pain, myalgias and neck pain.  Skin: Negative for itching and rash.  Neurological: Negative for dizziness, tremors and weakness.  Endo/Heme/Allergies: Does not bruise/bleed easily.  Psychiatric/Behavioral: Negative for depression. The patient is not nervous/anxious and does not have insomnia.    Objective: BP 132/90   Ht '5\' 6"'  (1.676 m)   Wt 166 lb (75.3 kg)   BMI 26.79 kg/m   Filed Weights   10/31/17 1303  Weight: 166 lb (75.3 kg)   Physical Exam  Constitutional: She is oriented to person, place, and time. She appears well-developed and well-nourished. No distress.  HENT:  Head: Normocephalic and atraumatic. Head is without laceration.  Right Ear: Hearing normal.  Left Ear:  Hearing normal.  Nose: No epistaxis.  No foreign bodies.  Mouth/Throat: Uvula is midline, oropharynx is clear and moist and mucous membranes are normal.  Eyes: Pupils are equal, round, and reactive to light.  Neck: Normal range of motion. Neck supple. No thyromegaly present.  Cardiovascular: Normal rate and regular rhythm. Exam reveals no gallop and no friction rub.  No murmur heard. Pulmonary/Chest: Effort normal and breath sounds normal. No respiratory distress. She has no wheezes. Right breast exhibits no mass, no skin change and no tenderness. Left breast exhibits no mass, no skin change and no tenderness.  Abdominal: Soft. Bowel sounds are normal. She exhibits no distension. There is no tenderness. There is no rebound.  Musculoskeletal: Normal range of motion.  Neurological: She is alert and oriented to person,  place, and time. No cranial nerve deficit.  Skin: Skin is warm and dry.  Psychiatric: She has a normal mood and affect. Judgment normal.  Vitals reviewed.  Assessment: 1. Dysfunctional uterine bleeding   2. Left sided abdominal pain   3. Pelvic pain   4. Right ovarian cyst   Plan TLH, BS, Right cystectomy, Cysto All options discussed, alternatives such as dx lap, observation, pain mgt.  I have had a careful discussion with this patient about all the options available and the risk/benefits of each. I have fully informed this patient that surgery may subject her to a variety of discomforts and risks: She understands that most patients have surgery with little difficulty, but problems can happen ranging from minor to fatal. These include nausea, vomiting, pain, bleeding, infection, poor healing, hernia, or formation of adhesions. Unexpected reactions may occur from any drug or anesthetic given. Unintended injury may occur to other pelvic or abdominal structures such as Fallopian tubes, ovaries, bladder, ureter (tube from kidney to bladder), or bowel. Nerves going from the pelvis to  the legs may be injured. Any such injury may require immediate or later additional surgery to correct the problem. Excessive blood loss requiring transfusion is very unlikely but possible. Dangerous blood clots may form in the legs or lungs. Physical and sexual activity will be restricted in varying degrees for an indeterminate period of time but most often 2-6 weeks.  Finally, she understands that it is impossible to list every possible undesirable effect and that the condition for which surgery is done is not always cured or significantly improved, and in rare cases may be even worse.Ample time was given to answer all questions.  Barnett Applebaum, MD, [facility], [facility] 10/31/2017  1:11 PM

## 2017-10-31 NOTE — Progress Notes (Signed)
PRE-OPERATIVE HISTORY AND PHYSICAL EXAM  HPI:  Alyssa Warren is a 47 y.o. [phone] No LMP recorded. Patient has had an ablation.; she is being admitted for surgery related to pelvic pain.  Pt has had prior ablation and had done well w no bleeding until recent heavy bleeding episode but more concerning to her was severe lower abdominal pains assoc w nausea and no radiation and no modifiers, for the last few weeks.  Started up again a few days ago in severe range again.  [place] and labs done.  Small cyst on right ovary but pain is more central and left sided.  Fluid in endometrium [place] consistant w prior ablation and possible hematocolpos to contribute to pain.  PMHx: Past Medical History:  Diagnosis Date  . Allergy   . Arthritis    LEFT KNEE  . Diabetes mellitus without complication ([place])   . GERD (gastroesophageal reflux disease)    OCC  . History of kidney stones    H/O  . Hypertension   . MVP (mitral valve prolapse)    Past Surgical History:  Procedure Laterality Date  . BREAST BIOPSY Right 02/2012   x2. benign  . cyst removed from ovary    . DILITATION & CURRETTAGE/HYSTROSCOPY WITH NOVASURE ABLATION N/A 12/23/2016   Procedure: DILATATION & CURETTAGE/HYSTEROSCOPY ENDOMETRIAL ABLATION-MINERVA;  Surgeon: Gae Dry, MD;  Location: [facility] OR[facility];  Service: Gynecology;  Laterality: N/A;  . KNEE SURGERY Left   . LABIOPLASTY N/A 07/12/2017   Procedure: EXCISION OF LABIAL CYST;  Surgeon: Gae Dry, MD;  Location: [facility] OR[facility];  Service: Gynecology;  Laterality: N/A;  . LAPAROSCOPIC SALPINGO OOPHERECTOMY Left 12/23/2016   Procedure: LAPAROSCOPIC SALPINGO OOPHORECTOMY;  Surgeon: Gae Dry, MD;  Location: [facility] OR[facility];  Service: Gynecology;  Laterality: Left;  . plantar fasciitis release Bilateral   . TUBAL LIGATION     Family History  Problem Relation Age of Onset  . Breast cancer Maternal Aunt        great Aunts   . Breast cancer Paternal Aunt    Social History   Tobacco  Use  . Smoking status: Current Some Day Smoker    Packs/day: 0.30    Years: 15.00    Pack years: 4.50    Types: Cigarettes    Last attempt to quit: 12/19/2016    Years since quitting: 0.8  . Smokeless tobacco: Never Used  Substance Use Topics  . Alcohol use: No    Alcohol/week: 0.0 oz  . Drug use: No    Current Outpatient Medications:  .  acetaminophen (TYLENOL) 500 MG tablet, Take 1,000 mg by mouth 2 (two) times daily as needed for moderate pain., Disp: , Rfl:  .  Blood Glucose Monitoring Suppl (ONE TOUCH ULTRA SYSTEM KIT) w/Device KIT, Use to check blood sugar one to two times daily.  Dx: E11.9, Disp: 1 each, Rfl: 0 .  fenofibrate 160 MG tablet, Take 1 tablet (160 mg total) by mouth daily., Disp: 30 tablet, Rfl: 11 .  glipiZIDE (GLUCOTROL XL) 10 MG 24 hr tablet, Take 1 tablet (10 mg total) by mouth daily with breakfast. (Patient taking differently: Take 10 mg by mouth at bedtime. ), Disp: 90 tablet, Rfl: 3 .  glucose blood (ONE TOUCH ULTRA TEST) test strip, Use to check blood sugar one to two times daily.  Dx: E11.9, Disp: 200 each, Rfl: 3 .  HYDROcodone-acetaminophen (NORCO) 5-325 MG tablet, Take 1 tablet by mouth every 4 (four) hours as needed  for moderate pain., Disp: 6 tablet, Rfl: 0 .  Insulin Pen Needle 31G X 8 MM MISC, Use to inject Victoza daily.  Dx: E11.65, Disp: 90 each, Rfl: 3 .  liraglutide (VICTOZA) 18 MG/3ML SOPN, Inject 0.1 mLs (0.6 mg total) into the skin daily. (Patient taking differently: Inject 0.6 mg into the skin at bedtime. ), Disp: 3 mL, Rfl: 3 .  losartan-hydrochlorothiazide (HYZAAR) 50-12.5 MG tablet, Take 1 tablet by mouth daily. (Patient taking differently: Take 1 tablet by mouth daily with lunch. ), Disp: 90 tablet, Rfl: 3 .  meloxicam (MOBIC) 15 MG tablet, Take 1 tablet (15 mg total) by mouth 2 (two) times daily as needed for pain. (Patient not taking: Reported on 10/28/2017), Disp: 20 tablet, Rfl: 1 .  metFORMIN (GLUCOPHAGE) 1000 MG tablet, TAKE 1 TABLET BY  MOUTH TWO  TIMES DAILY WITH A MEAL, Disp: 180 tablet, Rfl: 3 .  Multiple Vitamin (MULTIVITAMIN WITH MINERALS) TABS tablet, Take 2 tablets by mouth daily with lunch., Disp: , Rfl:  .  ONETOUCH DELICA LANCETS 22V MISC, Use to check blood sugar one to two times daily.  Dx: E11.9, Disp: 200 each, Rfl: 3 .  valACYclovir (VALTREX) 1000 MG tablet, Take 1 tablet (1,000 mg total) 2 (two) times daily by mouth. (Patient not taking: Reported on 10/28/2017), Disp: 14 tablet, Rfl: 1 .  venlafaxine XR (EFFEXOR-XR) 37.5 MG 24 hr capsule, 1 tab po at bedtime (Patient taking differently: Take 37.5 mg by mouth daily with lunch. ), Disp: 90 capsule, Rfl: 1 Allergies: Penicillins; Keflex [cephalexin]; and Methyldopa  Review of Systems  Constitutional: Negative for chills, fever and malaise/fatigue.  HENT: Negative for congestion, sinus pain and sore throat.   Eyes: Negative for blurred vision and pain.  Respiratory: Negative for cough and wheezing.   Cardiovascular: Negative for chest pain and leg swelling.  Gastrointestinal: Negative for abdominal pain, constipation, diarrhea, heartburn, nausea and vomiting.  Genitourinary: Negative for dysuria, frequency, hematuria and urgency.  Musculoskeletal: Negative for back pain, joint pain, myalgias and neck pain.  Skin: Negative for itching and rash.  Neurological: Negative for dizziness, tremors and weakness.  Endo/Heme/Allergies: Does not bruise/bleed easily.  Psychiatric/Behavioral: Negative for depression. The patient is not nervous/anxious and does not have insomnia.    Objective: BP 132/90   Ht '5\' 6"'  (1.676 m)   Wt 166 lb (75.3 kg)   BMI 26.79 kg/m   Filed Weights   10/31/17 1303  Weight: 166 lb (75.3 kg)   Physical Exam  Constitutional: She is oriented to person, place, and time. She appears well-developed and well-nourished. No distress.  HENT:  Head: Normocephalic and atraumatic. Head is without laceration.  Right Ear: Hearing normal.  Left Ear:  Hearing normal.  Nose: No epistaxis.  No foreign bodies.  Mouth/Throat: Uvula is midline, oropharynx is clear and moist and mucous membranes are normal.  Eyes: Pupils are equal, round, and reactive to light.  Neck: Normal range of motion. Neck supple. No thyromegaly present.  Cardiovascular: Normal rate and regular rhythm. Exam reveals no gallop and no friction rub.  No murmur heard. Pulmonary/Chest: Effort normal and breath sounds normal. No respiratory distress. She has no wheezes. Right breast exhibits no mass, no skin change and no tenderness. Left breast exhibits no mass, no skin change and no tenderness.  Abdominal: Soft. Bowel sounds are normal. She exhibits no distension. There is no tenderness. There is no rebound.  Musculoskeletal: Normal range of motion.  Neurological: She is alert and oriented to person,  place, and time. No cranial nerve deficit.  Skin: Skin is warm and dry.  Psychiatric: She has a normal mood and affect. Judgment normal.  Vitals reviewed.  Assessment: 1. Dysfunctional uterine bleeding   2. Left sided abdominal pain   3. Pelvic pain   4. Right ovarian cyst   Plan TLH, BS, Right cystectomy, Cysto All options discussed, alternatives such as dx lap, observation, pain mgt.  I have had a careful discussion with this patient about all the options available and the risk/benefits of each. I have fully informed this patient that surgery may subject her to a variety of discomforts and risks: She understands that most patients have surgery with little difficulty, but problems can happen ranging from minor to fatal. These include nausea, vomiting, pain, bleeding, infection, poor healing, hernia, or formation of adhesions. Unexpected reactions may occur from any drug or anesthetic given. Unintended injury may occur to other pelvic or abdominal structures such as Fallopian tubes, ovaries, bladder, ureter (tube from kidney to bladder), or bowel. Nerves going from the pelvis to  the legs may be injured. Any such injury may require immediate or later additional surgery to correct the problem. Excessive blood loss requiring transfusion is very unlikely but possible. Dangerous blood clots may form in the legs or lungs. Physical and sexual activity will be restricted in varying degrees for an indeterminate period of time but most often 2-6 weeks.  Finally, she understands that it is impossible to list every possible undesirable effect and that the condition for which surgery is done is not always cured or significantly improved, and in rare cases may be even worse.Ample time was given to answer all questions.  Barnett Applebaum, MD, [facility], [facility] 10/31/2017  1:11 PM

## 2017-11-01 ENCOUNTER — Encounter
Admission: RE | Disposition: A | Discharge status: Home / Self Care | Payer: Self-pay | Source: Ambulatory Visit | Attending: Obstetrics & Gynecology

## 2017-11-01 ENCOUNTER — Encounter: Payer: Self-pay | Admitting: *Deleted

## 2017-11-01 ENCOUNTER — Ambulatory Visit
Admission: RE | Admit: 2017-11-01 | Discharge: 2017-11-01 | Disposition: A | Discharge status: Home / Self Care | Payer: Commercial Managed Care - HMO | Source: Ambulatory Visit | Attending: Obstetrics & Gynecology | Admitting: Obstetrics & Gynecology

## 2017-11-01 ENCOUNTER — Ambulatory Visit: Payer: Commercial Managed Care - HMO | Admitting: Anesthesiology

## 2017-11-01 ENCOUNTER — Other Ambulatory Visit: Payer: Self-pay

## 2017-11-01 DIAGNOSIS — F17210 Nicotine dependence, cigarettes, uncomplicated: Secondary | ICD-10-CM | POA: Insufficient documentation

## 2017-11-01 DIAGNOSIS — N938 Other specified abnormal uterine and vaginal bleeding: Secondary | ICD-10-CM | POA: Diagnosis not present

## 2017-11-01 DIAGNOSIS — Z79899 Other long term (current) drug therapy: Secondary | ICD-10-CM | POA: Insufficient documentation

## 2017-11-01 DIAGNOSIS — Z794 Long term (current) use of insulin: Secondary | ICD-10-CM | POA: Insufficient documentation

## 2017-11-01 DIAGNOSIS — I10 Essential (primary) hypertension: Secondary | ICD-10-CM | POA: Insufficient documentation

## 2017-11-01 DIAGNOSIS — R109 Unspecified abdominal pain: Secondary | ICD-10-CM | POA: Diagnosis not present

## 2017-11-01 DIAGNOSIS — D251 Intramural leiomyoma of uterus: Secondary | ICD-10-CM | POA: Diagnosis not present

## 2017-11-01 DIAGNOSIS — Z881 Allergy status to other antibiotic agents status: Secondary | ICD-10-CM | POA: Insufficient documentation

## 2017-11-01 DIAGNOSIS — N83201 Unspecified ovarian cyst, right side: Secondary | ICD-10-CM | POA: Diagnosis present

## 2017-11-01 DIAGNOSIS — E119 Type 2 diabetes mellitus without complications: Secondary | ICD-10-CM | POA: Insufficient documentation

## 2017-11-01 DIAGNOSIS — R102 Pelvic and perineal pain: Secondary | ICD-10-CM | POA: Diagnosis not present

## 2017-11-01 DIAGNOSIS — Z880 Allergy status to penicillin: Secondary | ICD-10-CM | POA: Insufficient documentation

## 2017-11-01 DIAGNOSIS — Z87442 Personal history of urinary calculi: Secondary | ICD-10-CM | POA: Insufficient documentation

## 2017-11-01 DIAGNOSIS — K219 Gastro-esophageal reflux disease without esophagitis: Secondary | ICD-10-CM | POA: Insufficient documentation

## 2017-11-01 DIAGNOSIS — I341 Nonrheumatic mitral (valve) prolapse: Secondary | ICD-10-CM | POA: Insufficient documentation

## 2017-11-01 HISTORY — PX: LAPAROSCOPIC HYSTERECTOMY: SHX1926

## 2017-11-01 HISTORY — PX: CYSTOSCOPY: SHX5120

## 2017-11-01 LAB — GLUCOSE, CAPILLARY: GLUCOSE-CAPILLARY: 138 mg/dL — AB (ref 70–99)

## 2017-11-01 LAB — POCT PREGNANCY, URINE: Preg Test, Ur: NEGATIVE

## 2017-11-01 SURGERY — HYSTERECTOMY, TOTAL, LAPAROSCOPIC
Anesthesia: General | Laterality: Bilateral | Wound class: Clean Contaminated

## 2017-11-01 MED ORDER — MIDAZOLAM HCL 2 MG/2ML IJ SOLN
INTRAMUSCULAR | Status: AC
  Filled 2017-11-01: qty 2

## 2017-11-01 MED ORDER — LACTATED RINGERS IV SOLN
INTRAVENOUS | Status: DC | PRN
  Administered 2017-11-01: 11:00:00 via INTRAVENOUS

## 2017-11-01 MED ORDER — FENTANYL CITRATE (PF) 100 MCG/2ML IJ SOLN
INTRAMUSCULAR | Status: AC
  Filled 2017-11-01: qty 2

## 2017-11-01 MED ORDER — SCOPOLAMINE 1 MG/3DAYS TD PT72
MEDICATED_PATCH | TRANSDERMAL | Status: AC
  Filled 2017-11-01: qty 1

## 2017-11-01 MED ORDER — FAMOTIDINE 20 MG PO TABS
ORAL_TABLET | ORAL | Status: AC
  Filled 2017-11-01: qty 1

## 2017-11-01 MED ORDER — DEXAMETHASONE SODIUM PHOSPHATE 10 MG/ML IJ SOLN
INTRAMUSCULAR | Status: DC | PRN
  Administered 2017-11-01: 10 mg via INTRAVENOUS

## 2017-11-01 MED ORDER — SUGAMMADEX SODIUM 200 MG/2ML IV SOLN
INTRAVENOUS | Status: AC
  Filled 2017-11-01: qty 2

## 2017-11-01 MED ORDER — FAMOTIDINE 20 MG PO TABS
20.0000 mg | ORAL_TABLET | Freq: Once | ORAL | Status: AC
  Administered 2017-11-01: 20 mg via ORAL

## 2017-11-01 MED ORDER — OXYCODONE-ACETAMINOPHEN 5-325 MG PO TABS
ORAL_TABLET | ORAL | Status: AC
  Filled 2017-11-01: qty 1

## 2017-11-01 MED ORDER — HYDROMORPHONE HCL 1 MG/ML IJ SOLN
INTRAMUSCULAR | Status: AC
  Filled 2017-11-01: qty 1

## 2017-11-01 MED ORDER — OXYCODONE-ACETAMINOPHEN 5-325 MG PO TABS
1.0000 | ORAL_TABLET | Freq: Once | ORAL | Status: AC
  Administered 2017-11-01: 1 via ORAL

## 2017-11-01 MED ORDER — PROPOFOL 10 MG/ML IV BOLUS
INTRAVENOUS | Status: DC | PRN
  Administered 2017-11-01: 170 mg via INTRAVENOUS

## 2017-11-01 MED ORDER — BUPIVACAINE HCL (PF) 0.5 % IJ SOLN
INTRAMUSCULAR | Status: DC | PRN
  Administered 2017-11-01: 17 mL

## 2017-11-01 MED ORDER — PROMETHAZINE HCL 25 MG/ML IJ SOLN: 6.2500 mg | INTRAMUSCULAR | Status: DC | PRN

## 2017-11-01 MED ORDER — CLINDAMYCIN PHOSPHATE 900 MG/50ML IV SOLN
900.0000 mg | Freq: Once | INTRAVENOUS | Status: AC
  Administered 2017-11-01: 900 mg via INTRAVENOUS

## 2017-11-01 MED ORDER — KETOROLAC TROMETHAMINE 30 MG/ML IJ SOLN
30.0000 mg | Freq: Four times a day (QID) | INTRAMUSCULAR | Status: DC
  Filled 2017-11-01: qty 1

## 2017-11-01 MED ORDER — MIDAZOLAM HCL 2 MG/2ML IJ SOLN
INTRAMUSCULAR | Status: DC | PRN
  Administered 2017-11-01: 2 mg via INTRAVENOUS

## 2017-11-01 MED ORDER — PROPOFOL 10 MG/ML IV BOLUS
INTRAVENOUS | Status: AC
  Filled 2017-11-01: qty 20

## 2017-11-01 MED ORDER — OXYCODONE-ACETAMINOPHEN 5-325 MG PO TABS: 1.0000 | ORAL_TABLET | ORAL | Status: DC | PRN

## 2017-11-01 MED ORDER — LACTATED RINGERS IV SOLN: INTRAVENOUS | Status: DC

## 2017-11-01 MED ORDER — ACETAMINOPHEN 325 MG PO TABS: 650.0000 mg | ORAL_TABLET | ORAL | Status: DC | PRN

## 2017-11-01 MED ORDER — CLINDAMYCIN PHOSPHATE 900 MG/50ML IV SOLN
INTRAVENOUS | Status: AC
  Filled 2017-11-01: qty 50

## 2017-11-01 MED ORDER — ACETAMINOPHEN 650 MG RE SUPP
650.0000 mg | RECTAL | Status: DC | PRN
  Filled 2017-11-01: qty 1

## 2017-11-01 MED ORDER — SCOPOLAMINE 1 MG/3DAYS TD PT72
1.0000 | MEDICATED_PATCH | Freq: Once | TRANSDERMAL | Status: DC
  Administered 2017-11-01: 1.5 mg via TRANSDERMAL

## 2017-11-01 MED ORDER — ROCURONIUM BROMIDE 100 MG/10ML IV SOLN
INTRAVENOUS | Status: DC | PRN
  Administered 2017-11-01: 50 mg via INTRAVENOUS

## 2017-11-01 MED ORDER — SUGAMMADEX SODIUM 200 MG/2ML IV SOLN
INTRAVENOUS | Status: DC | PRN
  Administered 2017-11-01: 149.6 mg via INTRAVENOUS

## 2017-11-01 MED ORDER — BUPIVACAINE HCL (PF) 0.5 % IJ SOLN
INTRAMUSCULAR | Status: AC
  Filled 2017-11-01: qty 30

## 2017-11-01 MED ORDER — OXYCODONE-ACETAMINOPHEN 5-325 MG PO TABS: 1.0000 | ORAL_TABLET | ORAL | 0 refills | Status: DC | PRN

## 2017-11-01 MED ORDER — ONDANSETRON HCL 4 MG/2ML IJ SOLN
INTRAMUSCULAR | Status: DC | PRN
  Administered 2017-11-01: 4 mg via INTRAVENOUS

## 2017-11-01 MED ORDER — ACETAMINOPHEN NICU IV SYRINGE 10 MG/ML
INTRAVENOUS | Status: AC
  Filled 2017-11-01: qty 1

## 2017-11-01 MED ORDER — FENTANYL CITRATE (PF) 100 MCG/2ML IJ SOLN
INTRAMUSCULAR | Status: DC | PRN
  Administered 2017-11-01: 25 ug via INTRAVENOUS
  Administered 2017-11-01: 100 ug via INTRAVENOUS

## 2017-11-01 MED ORDER — ROCURONIUM BROMIDE 50 MG/5ML IV SOLN
INTRAVENOUS | Status: AC
  Filled 2017-11-01: qty 1

## 2017-11-01 MED ORDER — SODIUM CHLORIDE 0.9 % IV SOLN
INTRAVENOUS | Status: DC
Start: 2017-11-01 — End: 2017-11-01
  Administered 2017-11-01: 11:00:00 via INTRAVENOUS

## 2017-11-01 MED ORDER — HYDROMORPHONE HCL 1 MG/ML IJ SOLN
0.5000 mg | INTRAMUSCULAR | Status: DC | PRN
  Administered 2017-11-01 (×2): 0.5 mg via INTRAVENOUS

## 2017-11-01 MED ORDER — MORPHINE SULFATE (PF) 4 MG/ML IV SOLN: 1.0000 mg | INTRAVENOUS | Status: DC | PRN

## 2017-11-01 MED ORDER — PHENYLEPHRINE HCL 10 MG/ML IJ SOLN
INTRAMUSCULAR | Status: DC | PRN
  Administered 2017-11-01 (×2): 100 ug via INTRAVENOUS

## 2017-11-01 MED ORDER — FENTANYL CITRATE (PF) 100 MCG/2ML IJ SOLN
25.0000 ug | INTRAMUSCULAR | Status: DC | PRN
  Administered 2017-11-01 (×2): 50 ug via INTRAVENOUS

## 2017-11-01 SURGICAL SUPPLY — 51 items
ADH SKN CLS APL DERMABOND .7 (GAUZE/BANDAGES/DRESSINGS) ×2
BAG URINE DRAINAGE (UROLOGICAL SUPPLIES) ×3 IMPLANT
BLADE SURG SZ11 CARB STEEL (BLADE) ×3 IMPLANT
CANISTER SUCT 1200ML W/VALVE (MISCELLANEOUS) ×3 IMPLANT
CATH FOLEY 2WAY  5CC 16FR (CATHETERS) ×1
CATH FOLEY 2WAY 5CC 16FR (CATHETERS) ×2
CATH URTH 16FR FL 2W BLN LF (CATHETERS) ×2 IMPLANT
CHLORAPREP W/TINT 26ML (MISCELLANEOUS) ×3 IMPLANT
DEFOGGER SCOPE WARMER CLEARIFY (MISCELLANEOUS) ×3 IMPLANT
DERMABOND ADVANCED (GAUZE/BANDAGES/DRESSINGS) ×1
DERMABOND ADVANCED .7 DNX12 (GAUZE/BANDAGES/DRESSINGS) ×2 IMPLANT
DEVICE SUTURE ENDOST 10MM (ENDOMECHANICALS) ×1 IMPLANT
DRAPE CAMERA CLOSED 9X96 (DRAPES) ×3 IMPLANT
DRSG TEGADERM 2-3/8X2-3/4 SM (GAUZE/BANDAGES/DRESSINGS) IMPLANT
DRSG TEGADERM 2X2.25 PEDS (GAUZE/BANDAGES/DRESSINGS) ×1 IMPLANT
GLOVE BIO SURGEON STRL SZ8 (GLOVE) ×15 IMPLANT
GLOVE INDICATOR 8.0 STRL GRN (GLOVE) ×3 IMPLANT
GOWN STRL REUS W/ TWL LRG LVL3 (GOWN DISPOSABLE) ×2 IMPLANT
GOWN STRL REUS W/ TWL XL LVL3 (GOWN DISPOSABLE) ×4 IMPLANT
GOWN STRL REUS W/TWL LRG LVL3 (GOWN DISPOSABLE) ×3
GOWN STRL REUS W/TWL XL LVL3 (GOWN DISPOSABLE) ×6
GRASPER SUT TROCAR 14GX15 (MISCELLANEOUS) ×3 IMPLANT
IRRIGATION STRYKERFLOW (MISCELLANEOUS) ×2 IMPLANT
IRRIGATOR STRYKERFLOW (MISCELLANEOUS) ×3
IV LACTATED RINGERS 1000ML (IV SOLUTION) ×6 IMPLANT
KIT PINK PAD W/HEAD ARE REST (MISCELLANEOUS) ×3
KIT PINK PAD W/HEAD ARM REST (MISCELLANEOUS) ×2 IMPLANT
KIT TURNOVER CYSTO (KITS) ×3 IMPLANT
LABEL OR SOLS (LABEL) ×3 IMPLANT
MANIPULATOR VCARE LG CRV RETR (MISCELLANEOUS) ×1 IMPLANT
MANIPULATOR VCARE SML CRV RETR (MISCELLANEOUS) IMPLANT
MANIPULATOR VCARE STD CRV RETR (MISCELLANEOUS) IMPLANT
NEEDLE VERESS 14GA 120MM (NEEDLE) ×3 IMPLANT
NS IRRIG 500ML POUR BTL (IV SOLUTION) ×3 IMPLANT
OCCLUDER COLPOPNEUMO (BALLOONS) ×3 IMPLANT
PACK GYN LAPAROSCOPIC (MISCELLANEOUS) ×3 IMPLANT
PAD OB MATERNITY 4.3X12.25 (PERSONAL CARE ITEMS) ×3 IMPLANT
PAD PREP 24X41 OB/GYN DISP (PERSONAL CARE ITEMS) ×3 IMPLANT
SCISSORS METZENBAUM CVD 33 (INSTRUMENTS) IMPLANT
SET CYSTO W/LG BORE CLAMP LF (SET/KITS/TRAYS/PACK) ×3 IMPLANT
SHEARS HARMONIC ACE PLUS 36CM (ENDOMECHANICALS) ×3 IMPLANT
SLEEVE ENDOPATH XCEL 5M (ENDOMECHANICALS) ×3 IMPLANT
SPONGE GAUZE 2X2 8PLY STRL LF (GAUZE/BANDAGES/DRESSINGS) ×1 IMPLANT
SUT ENDO VLOC 180-0-8IN (SUTURE) ×1 IMPLANT
SUT VIC AB 0 CT1 36 (SUTURE) ×3 IMPLANT
SUT VIC AB 4-0 FS2 27 (SUTURE) ×3 IMPLANT
SYR 10ML LL (SYRINGE) ×3 IMPLANT
SYR 50ML LL SCALE MARK (SYRINGE) ×3 IMPLANT
TROCAR ENDO BLADELESS 11MM (ENDOMECHANICALS) ×3 IMPLANT
TROCAR XCEL NON-BLD 5MMX100MML (ENDOMECHANICALS) ×3 IMPLANT
TUBING INSUF HEATED (TUBING) ×3 IMPLANT

## 2017-11-01 NOTE — Anesthesia Preprocedure Evaluation (Addendum)
Anesthesia Evaluation  Patient identified by MRN, date of birth, ID band Patient awake    Reviewed: Allergy & Precautions, H&P , NPO status , Patient's Chart, lab work & pertinent test results, reviewed documented beta blocker date and time   History of Anesthesia Complications (+) PONV and history of anesthetic complications  Airway Mallampati: I  TM Distance: >3 FB Neck ROM: full    Dental  (+) Caps, Dental Advidsory Given, Teeth Intact   Pulmonary neg pulmonary ROS, Current Smoker, former smoker,           Cardiovascular Exercise Tolerance: Good hypertension, (-) angina(-) CAD, (-) Past MI, (-) Cardiac Stents and (-) CABG (-) dysrhythmias + Valvular Problems/Murmurs AS      Neuro/Psych PSYCHIATRIC DISORDERS (Depression) negative neurological ROS     GI/Hepatic negative GI ROS, Neg liver ROS,   Endo/Other  diabetes, Type 2, Oral Hypoglycemic Agents  Renal/GU negative Renal ROS  negative genitourinary   Musculoskeletal   Abdominal   Peds  Hematology negative hematology ROS (+)   Anesthesia Other Findings Past Medical History: No date: Allergy No date: Diabetes mellitus without complication ([facility]) No date: Hypertension   Reproductive/Obstetrics negative OB ROS                             Anesthesia Physical  Anesthesia Plan  ASA: II  Anesthesia Plan: General   Post-op Pain Management:    Induction: Intravenous  PONV Risk Score and Plan: 4 or greater and Ondansetron, Dexamethasone, Midazolam and Scopolamine patch - Pre-op  Airway Management Planned: Oral ETT  Additional Equipment:   Intra-op Plan:   Post-operative Plan: Extubation in OR  Informed Consent: I have reviewed the patients History and Physical, chart, labs and discussed the procedure including the risks, benefits and alternatives for the proposed anesthesia with the patient or authorized representative who has  indicated his/her understanding and acceptance.   Dental Advisory Given  Plan Discussed with: Anesthesiologist, CRNA and Surgeon  Anesthesia Plan Comments:        Anesthesia Quick Evaluation

## 2017-11-01 NOTE — Anesthesia Post-op Follow-up Note (Signed)
Anesthesia QCDR form completed.        

## 2017-11-01 NOTE — Anesthesia Procedure Notes (Signed)
Procedure Name: Intubation Date/Time: 11/01/2017 11:15 AM Performed by: Allean Found, CRNA Pre-anesthesia Checklist: Patient identified, Emergency Drugs available, Suction available, Patient being monitored and Timeout performed Patient Re-evaluated:Patient Re-evaluated prior to induction Oxygen Delivery Method: Circle system utilized Preoxygenation: Pre-oxygenation with 100% oxygen Induction Type: IV induction Ventilation: Mask ventilation without difficulty Laryngoscope Size: Mac and 3 Grade View: Grade II Tube type: Oral Tube size: 7.0 mm Number of attempts: 1 Airway Equipment and Method: Stylet Placement Confirmation: positive ETCO2,  ETT inserted through vocal cords under direct vision and breath sounds checked- equal and bilateral Secured at: 21 cm Tube secured with: Tape Dental Injury: Teeth and Oropharynx as per pre-operative assessment

## 2017-11-01 NOTE — Discharge Instructions (Signed)

## 2017-11-01 NOTE — Op Note (Signed)
Operative Report:  PRE-OP DIAGNOSIS: dysfunctional uterine bleeding, left sided abdominal pain, pelvic pain, right ovarian cyst   POST-OP DIAGNOSIS: dysfunctional uterine bleeding, left sided abdominal pain, pelvic pain, right ovarian cyst   PROCEDURE: Procedure(s): HYSTERECTOMY TOTAL LAPAROSCOPIC BILATERAL SALPINGECTOMY RIGHT OVARIAN CYSTECTOMY CYSTOSCOPY  SURGEON: Barnett Applebaum, MD, FACOG  ASSISTANT: Dr Georgianne Fick, No other capable assistant available, in surgery requiring high level assistant.  ANESTHESIA: General endotracheal anesthesia  ESTIMATED BLOOD LOSS: less than 50   SPECIMENS: Uterus, Right tube, Right ovarian Cyst  COMPLICATIONS: None  DISPOSITION: stable to PACU  FINDINGS: Intraabdominal adhesions were not noted. Small right ovarian cyst, clear fluid.  Absent left tube and ovary.  Presence of Essure and Fallope ring on right tube.  PROCEDURE:  The patient was taken to the OR where anesthesia was administed. She was prepped and draped in the normal sterile fashion in the dorsal lithotomy position in the Sturgeon stirrups. A time out was performed. A Graves speculum was inserted, the cervix was grasped with a single tooth tenaculum and the endometrial cavity was sounded. The cervix was progressively dilated to a size 18 [place] with [employer] dilators. A V-Care uterine manipulator was inserted in the usual fashion without incident. Gloves were changed and attention was turned to the abdomen.   An infraumbilical transverse 90mm skin incision was made with the scalpel after local anesthesia applied to the skin. A Veress-step needle was inserted in the usual fashion and confirmed using the hanging drop technique. A pneumoperitoneum was obtained by insufflation of CO2 (opening pressure of 45mmHg) to 81mmHg. A diagnostic laparoscopy was performed yielding the previously described findings. Attention was turned to the left lower quadrant where after visualization of the inferior epigastric  vessels a 71mm skin incision was made with the scalpel. A 5 mm laparoscopic port was inserted. The same procedure was repeated in the right lower quadrant with a 30mm trocar. Attention was turned to the right aspect of the uterus, where after visualization of the ureter, the round ligament was coagulated and transected using the 74mm Harmonic Scapel. The anterior and posterior leafs of the broad ligament were dissected off as the anterior one was coagulated and transected in a caudal direction towards the cuff of the uterine manipulator.  Attention was then turned to the left fallopian tube which was recognized by visualization of the fimbria. The tube is excised to its attachment to the uterus. The uterine-ovarian ligament and its blood vessels were carefully coagulated and transected using the Harmonic scapel.  Attention was turned to the left aspect of the uterus where simialr procedure was performed although left tube and ovary was absent from prior surgery.  The vesicouterine reflection of the peritoneum was dissected with the harmonic scapel and the bladder flap was created bluntly.  The uterine vessels were coagulated and transected bilaterally using first bipolar cautery and then the harmonic scapel. A 360 degree, circumferential colpotomy was done to completely amputate the uterus with cervix and tubes. Once the specimen was amputated it was delivered through the vagina.   The colpotomy was repaired in a simple running fashion using a delayed absorbable suture with an endo-stitch device.  Vaginal exam confirms complete closure.  The cavity was copiously irrigated. A survey of the pelvic cavity revealed adequate hemostasis and no injury to bowel, bladder, or ureter.  The right ovarian cyst is grasped and excised with the Harmonic scapel.  Clear fluid noted within cyst.  Hemostasis assured.   A diagnostic cystoscopy was performed using saline distension  of bladder with no lesions or injuries noted.   Bilateral urine flow from each ureteral orifice is visualized.  At this point the procedure was finalized. Right lower quadrant fascia incision is closed with a vicryl suture using the fascia closure device. All the instruments were removed from the patient's body. Gas was expelled and patient is leveled.  Incisions are closed with skin adhesive.    Patient goes to recovery room in stable condition.  All sponge, instrument, and needle counts are correct x2.     Barnett Applebaum, MD, [facility], [facility] 11/01/2017  12:41 PM

## 2017-11-01 NOTE — Interval H&P Note (Signed)
History and Physical Interval Note:  11/01/2017 10:27 AM  Alyssa Warren  has presented today for surgery, with the diagnosis of n/a  The various methods of treatment have been discussed with the patient and family. After consideration of risks, benefits and other options for treatment, the patient has consented to  Procedure(s): HYSTERECTOMY TOTAL LAPAROSCOPIC BILATERAL SALPINGECTOMY (Bilateral) as a surgical intervention .  The patient's history has been reviewed, patient examined, no change in status, stable for surgery.  I have reviewed the patient's chart and labs.  Questions were answered to the patient's satisfaction.     Hoyt Koch

## 2017-11-01 NOTE — Transfer of Care (Signed)
Immediate Anesthesia Transfer of Care Note  Patient: Alyssa Warren  Procedure(s) Performed: HYSTERECTOMY TOTAL LAPAROSCOPIC BILATERAL SALPINGECTOMY (Bilateral ) CYSTOSCOPY  Patient Location: PACU  Anesthesia Type:General  Level of Consciousness: awake  Airway & Oxygen Therapy: Patient Spontanous Breathing and Patient connected to face mask oxygen  Post-op Assessment: Report given to RN and Post -op Vital signs reviewed and stable  Post vital signs: Reviewed and stable  Last Vitals:  Vitals Value Taken Time  BP 146/99 11/01/2017 12:49 PM  Temp 36.6 C 11/01/2017 12:48 PM  Pulse 79 11/01/2017 12:57 PM  Resp 14 11/01/2017 12:57 PM  SpO2 92 % 11/01/2017 12:57 PM  Vitals shown include unvalidated device data.  Last Pain:  Vitals:   11/01/17 1011  TempSrc: Tympanic  PainSc: 3          Complications: No apparent anesthesia complications

## 2017-11-02 ENCOUNTER — Encounter: Payer: Self-pay | Admitting: Obstetrics & Gynecology

## 2017-11-02 LAB — SURGICAL PATHOLOGY

## 2017-11-05 NOTE — Anesthesia Postprocedure Evaluation (Signed)
Anesthesia Post Note  Patient: Alyssa Warren  Procedure(s) Performed: HYSTERECTOMY TOTAL LAPAROSCOPIC BILATERAL SALPINGECTOMY, RIGHT OVARIAN CYSTECTOMY (Bilateral ) CYSTOSCOPY  Patient location during evaluation: PACU Anesthesia Type: General Level of consciousness: awake and alert Pain management: pain level controlled Vital Signs Assessment: post-procedure vital signs reviewed and stable Respiratory status: spontaneous breathing, nonlabored ventilation, respiratory function stable and patient connected to nasal cannula oxygen Cardiovascular status: blood pressure returned to baseline and stable Postop Assessment: no apparent nausea or vomiting Anesthetic complications: no     Last Vitals:  Vitals:   11/01/17 1401 11/01/17 1530  BP: (!) 141/95 138/87  Pulse: 83 75  Resp: 16 16  Temp: 36.6 C   SpO2: 95% 97%    Last Pain:  Vitals:   11/01/17 1401  TempSrc: Temporal  PainSc: 8                  Martha Clan

## 2017-11-08 ENCOUNTER — Encounter: Payer: Self-pay | Admitting: Family Medicine

## 2017-11-08 MED ORDER — VENLAFAXINE HCL ER 37.5 MG PO CP24: 37.5000 mg | ORAL_CAPSULE | Freq: Every day | ORAL | 0 refills | Status: DC

## 2017-11-14 ENCOUNTER — Ambulatory Visit: Payer: 59 | Admitting: Obstetrics & Gynecology

## 2017-11-14 ENCOUNTER — Encounter: Payer: Self-pay | Admitting: Obstetrics & Gynecology

## 2017-11-14 ENCOUNTER — Ambulatory Visit (INDEPENDENT_AMBULATORY_CARE_PROVIDER_SITE_OTHER): Payer: 59 | Admitting: Obstetrics & Gynecology

## 2017-11-14 VITALS — BP 120/80 | Ht 66.0 in | Wt 162.0 lb

## 2017-11-14 DIAGNOSIS — N83201 Unspecified ovarian cyst, right side: Secondary | ICD-10-CM

## 2017-11-14 DIAGNOSIS — Z9889 Other specified postprocedural states: Secondary | ICD-10-CM

## 2017-11-14 DIAGNOSIS — N938 Other specified abnormal uterine and vaginal bleeding: Secondary | ICD-10-CM

## 2017-11-14 DIAGNOSIS — R109 Unspecified abdominal pain: Secondary | ICD-10-CM

## 2017-11-14 NOTE — Progress Notes (Signed)
  Postoperative Follow-up Patient presents post op from [facility] for pelvic pain, 2 weeks ago. Images:   Pathology: DIAGNOSIS:  A. UTERUS WITH CERVIX; HYSTERECTOMY:  - CERVIX WITHOUT PATHOLOGIC CHANGES.  - PROLIFERATIVE ENDOMETRIUM.  - INTRAMURAL LEIOMYOMA, 5 MM.   RIGHT FALLOPIAN TUBE; SALPINGECTOMY:  - NO PATHOLOGIC CHANGES.  - ESSURE DEVICE AND LIGATION RING PRESENT.   B. OVARY, RIGHT; CYSTECTOMY:  - FOLLICULAR CYST.   Subjective: Patient reports marked improvement in her preop symptoms. Eating a regular diet without difficulty. Mild RLQ pain, improving.  Diarrhea one week.  Activity: normal activities of daily living. Patient reports vaginal sx's of None  Objective: BP 120/80   Ht 5\' 6"  (1.676 m)   Wt 162 lb (73.5 kg)   LMP 10/12/2017   BMI 26.15 kg/m  Physical Exam  Constitutional: She is oriented to person, place, and time. She appears well-developed and well-nourished. No distress.  Cardiovascular: Normal rate.  Pulmonary/Chest: Effort normal.  Abdominal: Soft. She exhibits no distension. There is no tenderness.  Incision Healing Well   Musculoskeletal: Normal range of motion.  Neurological: She is alert and oriented to person, place, and time. No cranial nerve deficit.  Skin: Skin is warm and dry.  Psychiatric: She has a normal mood and affect.    Assessment: s/p :  total laparoscopic hysterectomy with bilateral salpingectomy progressing well  Plan: Patient has done well after surgery with no apparent complications.  I have discussed the post-operative course to date, and the expected progress moving forward.  The patient understands what complications to be concerned about.  I will see the patient in routine follow up, or sooner if needed.    Activity plan: No heavy lifting. Pelvic rest  Hoyt Koch 11/14/2017, 9:51 AM

## 2017-11-16 ENCOUNTER — Ambulatory Visit
Admission: RE | Admit: 2017-11-16 | Discharge: 2017-11-16 | Disposition: A | Discharge status: Home / Self Care | Payer: 59 | Source: Ambulatory Visit | Attending: Family Medicine | Admitting: Family Medicine

## 2017-11-16 ENCOUNTER — Other Ambulatory Visit: Payer: Self-pay | Admitting: Family Medicine

## 2017-11-16 DIAGNOSIS — Z1231 Encounter for screening mammogram for malignant neoplasm of breast: Secondary | ICD-10-CM | POA: Diagnosis not present

## 2017-11-22 ENCOUNTER — Encounter: Payer: Self-pay | Admitting: Family Medicine

## 2017-11-23 ENCOUNTER — Other Ambulatory Visit: Payer: 59

## 2017-11-23 ENCOUNTER — Ambulatory Visit: Payer: 59 | Admitting: Obstetrics & Gynecology

## 2017-11-23 MED ORDER — LOSARTAN POTASSIUM-HCTZ 50-12.5 MG PO TABS: 1.0000 | ORAL_TABLET | Freq: Every day | ORAL | 0 refills | Status: DC

## 2017-11-24 ENCOUNTER — Telehealth: Payer: Self-pay

## 2017-11-24 NOTE — Telephone Encounter (Signed)
FMLA/DISABILITY form for [employer] filled out, signature obtained, and given to [place] for processing.

## 2017-12-12 ENCOUNTER — Encounter: Payer: Self-pay | Admitting: Obstetrics & Gynecology

## 2017-12-12 ENCOUNTER — Ambulatory Visit (INDEPENDENT_AMBULATORY_CARE_PROVIDER_SITE_OTHER): Payer: 59 | Admitting: Obstetrics & Gynecology

## 2017-12-12 VITALS — BP 138/90 | Ht 66.0 in | Wt 163.0 lb

## 2017-12-12 DIAGNOSIS — Z23 Encounter for immunization: Secondary | ICD-10-CM | POA: Diagnosis not present

## 2017-12-12 DIAGNOSIS — N920 Excessive and frequent menstruation with regular cycle: Secondary | ICD-10-CM

## 2017-12-12 DIAGNOSIS — Z9889 Other specified postprocedural states: Secondary | ICD-10-CM

## 2017-12-12 NOTE — Progress Notes (Signed)
  Postoperative Follow-up Patient presents post op from [facility] for pelvic pain, 6 weeks ago.  Subjective: Patient reports marked improvement in her preop symptoms. Eating a regular diet without difficulty. The patient is not having any pain.  Activity: normal activities of daily living. Patient reports vaginal sx's of None  Objective: BP 138/90   Ht 5\' 6"  (1.676 m)   Wt 163 lb (73.9 kg)   LMP 10/12/2017   BMI 26.31 kg/m  Physical Exam  Constitutional: She is oriented to person, place, and time. She appears well-developed and well-nourished. No distress.  Genitourinary: Rectum normal and vagina normal. Pelvic exam was performed with patient supine. There is no rash, tenderness or lesion on the right labia. There is no rash, tenderness or lesion on the left labia. No erythema or bleeding in the vagina. Right adnexum does not display mass and does not display tenderness. Left adnexum does not display mass and does not display tenderness.  Genitourinary Comments: Cervix and uterus absent. Vaginal cuff healing well w small amount of granulation tissue, pink to touch w qtip, no separation or concern for dehiscence  Cardiovascular: Normal rate.  Pulmonary/Chest: Effort normal.  Abdominal: Soft. She exhibits no distension. There is no tenderness.  Incision healing well.  Musculoskeletal: Normal range of motion.  Neurological: She is alert and oriented to person, place, and time. No cranial nerve deficit.  Skin: Skin is warm and dry.  Psychiatric: She has a normal mood and affect.    Assessment: s/p :  total laparoscopic hysterectomy with bilateral salpingectomy progressing well  Plan: Patient has done well after surgery with no apparent complications.  I have discussed the post-operative course to date, and the expected progress moving forward.  The patient understands what complications to be concerned about.  I will see the patient in routine follow up, or sooner if needed.    Activity plan:  No restriction.  Hoyt Koch 12/12/2017, 9:28 AM

## 2017-12-31 ENCOUNTER — Telehealth: Payer: Self-pay | Admitting: Family Medicine

## 2018-01-02 NOTE — Telephone Encounter (Signed)
Last office visit 08/26/2017 for Diabetes.  [facility] states to follow up in 2 months (10/26/17).  No future appointments.  Patient did have hysterectomy 11/01/2017.  Ok to refill?

## 2018-01-02 NOTE — Telephone Encounter (Signed)
Please schedule DM follow up with fasting labs prior with Dr. Diona Browner.

## 2018-01-02 NOTE — Telephone Encounter (Signed)
Will refill but pt needs 3 months DM follow up scheduled with fasting labs prior

## 2018-01-10 NOTE — Telephone Encounter (Signed)
I spoke with pt and set up appt 10/25. She said [facility] sent refill request automatically- she is not out of meds. She will come in early for fasting labs but needs orders and will drop in prior to appt to complete labs as her schedule allows.

## 2018-02-07 ENCOUNTER — Other Ambulatory Visit: Payer: Self-pay

## 2018-02-07 MED ORDER — VENLAFAXINE HCL ER 37.5 MG PO CP24: 37.5000 mg | ORAL_CAPSULE | Freq: Every day | ORAL | 0 refills | Status: DC

## 2018-02-14 ENCOUNTER — Telehealth: Payer: Self-pay | Admitting: Family Medicine

## 2018-02-14 DIAGNOSIS — E1165 Type 2 diabetes mellitus with hyperglycemia: Secondary | ICD-10-CM

## 2018-02-14 NOTE — Telephone Encounter (Signed)
Orders placed.

## 2018-02-14 NOTE — Telephone Encounter (Signed)
Copied from [place] [id number]. Topic: Appointment Scheduling - Scheduling Inquiry for Clinic >> Feb 14, 2018  8:51 AM Margot Ables wrote: Reason for CRM: pt called stating she is supposed to have fasting a1c and lipids. She requested me to schedule her 02/16/18 but only lipid order is in. Please advise.

## 2018-02-16 ENCOUNTER — Other Ambulatory Visit (INDEPENDENT_AMBULATORY_CARE_PROVIDER_SITE_OTHER): Payer: 59

## 2018-02-16 DIAGNOSIS — E1165 Type 2 diabetes mellitus with hyperglycemia: Secondary | ICD-10-CM | POA: Diagnosis not present

## 2018-02-16 LAB — COMPREHENSIVE METABOLIC PANEL
ALT: 25 U/L (ref 0–35)
AST: 19 U/L (ref 0–37)
Albumin: 4.5 g/dL (ref 3.5–5.2)
Alkaline Phosphatase: 62 U/L (ref 39–117)
BUN: 14 mg/dL (ref 6–23)
CALCIUM: 9.6 mg/dL (ref 8.4–10.5)
CHLORIDE: 99 meq/L (ref 96–112)
CO2: 29 meq/L (ref 19–32)
Creatinine, Ser: 0.79 mg/dL (ref 0.40–1.20)
GFR: 82.53 mL/min (ref >60.00)
GLUCOSE: 173 mg/dL — AB (ref 70–99)
Potassium: 4 mEq/L (ref 3.5–5.1)
Sodium: 138 mEq/L (ref 135–145)
Total Bilirubin: 0.6 mg/dL (ref 0.2–1.2)
Total Protein: 7.3 g/dL (ref 6.0–8.3)

## 2018-02-16 LAB — LIPID PANEL
CHOL/HDL RATIO: 7
Cholesterol: 276 mg/dL — ABNORMAL HIGH (ref 0–200)
HDL: 39.4 mg/dL (ref >39.00)

## 2018-02-16 LAB — LDL CHOLESTEROL, DIRECT: LDL DIRECT: 103 mg/dL

## 2018-02-16 LAB — HEMOGLOBIN A1C: HEMOGLOBIN A1C: 7.9 % — AB (ref 4.6–6.5)

## 2018-02-17 ENCOUNTER — Ambulatory Visit: Payer: 59 | Admitting: Family Medicine

## 2018-02-24 ENCOUNTER — Encounter: Payer: Self-pay | Admitting: Family Medicine

## 2018-02-24 ENCOUNTER — Ambulatory Visit: Payer: 59 | Admitting: Family Medicine

## 2018-02-24 VITALS — BP 120/84 | HR 94 | Temp 98.2°F | Ht 66.0 in | Wt 167.2 lb

## 2018-02-24 DIAGNOSIS — E1165 Type 2 diabetes mellitus with hyperglycemia: Secondary | ICD-10-CM

## 2018-02-24 DIAGNOSIS — E1169 Type 2 diabetes mellitus with other specified complication: Secondary | ICD-10-CM | POA: Diagnosis not present

## 2018-02-24 DIAGNOSIS — E785 Hyperlipidemia, unspecified: Secondary | ICD-10-CM | POA: Diagnosis not present

## 2018-02-24 DIAGNOSIS — I10 Essential (primary) hypertension: Secondary | ICD-10-CM | POA: Diagnosis not present

## 2018-02-24 LAB — HM DIABETES FOOT EXAM

## 2018-02-24 MED ORDER — ATORVASTATIN CALCIUM 40 MG PO TABS: 40.0000 mg | ORAL_TABLET | Freq: Every day | ORAL | 3 refills | Status: DC

## 2018-02-24 MED ORDER — NICOTINE 14 MG/24HR TD PT24: 14.0000 mg | MEDICATED_PATCH | Freq: Every day | TRANSDERMAL | 0 refills | Status: DC

## 2018-02-24 MED ORDER — VENLAFAXINE HCL ER 37.5 MG PO CP24: 37.5000 mg | ORAL_CAPSULE | Freq: Every day | ORAL | 1 refills | Status: DC

## 2018-02-24 MED ORDER — VALACYCLOVIR HCL 1 G PO TABS: 1000.0000 mg | ORAL_TABLET | Freq: Two times a day (BID) | ORAL | 1 refills | Status: DC

## 2018-02-24 NOTE — Progress Notes (Signed)
Subjective:    Patient ID: Alyssa Warren, female    DOB: Jul 01, 1970, 47 y.o.   MRN: [id number]  HPI   47 year old female presents for follow up DM  Diabetes:  Inadequate but improving control.Marland Kitchen glipizide, on victoza 0.6 daily, metformin Lab Results  Component Value Date   HGBA1C 7.9 (H) 02/16/2018  Using medications without difficulties: Hypoglycemic episodes: Hyperglycemic episodes: Feet problems:no ulcers Blood Sugars averaging: 140-150 eye exam within last year: 05/2017   Wt Readings from Last 3 Encounters:  02/24/18 167 lb 4 oz (75.9 kg)  12/12/17 163 lb (73.9 kg)  11/14/17 162 lb (73.5 kg)     Elevated Cholesterol:  Poor control not taking fenofibrate Cannot afford.. Wants something else. HAs nevere tried statin. Lab Results  Component Value Date   CHOL 276 (H) 02/16/2018   HDL 39.40 02/16/2018   LDLCALC 26 06/29/2013   LDLDIRECT 103.0 02/16/2018   TRIG (H) 02/16/2018    905.0 Triglyceride is over 400; calculations on Lipids are invalid.   CHOLHDL 7 02/16/2018  Using medications without problems: Muscle aches:  Diet compliance: Exercise: Other complaints:  Hypertension:   Well controlled on losartan HCTZ Using medication without problems or lightheadedness:  Chest pain with exertion: Edema: Short of breath: Average home BPs: Other issues:   Blood pressure 120/84, pulse 94, temperature 98.2 F (36.8 C), temperature source Oral, height 5\' 6"  (1.676 m), weight 167 lb 4 oz (75.9 kg), last menstrual period 10/12/2017, SpO2 95 %. Social History /Family History/Past Medical History reviewed in detail and updated in EMR if needed.   Review of Systems  Constitutional: Negative for fatigue and fever.  HENT: Negative for congestion.   Eyes: Negative for pain.  Respiratory: Negative for cough and shortness of breath.   Cardiovascular: Negative for chest pain, palpitations and leg swelling.  Gastrointestinal: Negative for abdominal pain.    Genitourinary: Negative for dysuria and vaginal bleeding.  Musculoskeletal: Negative for back pain.  Neurological: Negative for syncope, light-headedness and headaches.  Psychiatric/Behavioral: Negative for dysphoric mood.       Objective:   Physical Exam  Constitutional: Vital signs are normal. She appears well-developed and well-nourished. She is cooperative.  Non-toxic appearance. She does not appear ill. No distress.  HENT:  Head: Normocephalic.  Right Ear: Hearing, tympanic membrane, external ear and ear canal normal. Tympanic membrane is not erythematous, not retracted and not bulging.  Left Ear: Hearing, tympanic membrane, external ear and ear canal normal. Tympanic membrane is not erythematous, not retracted and not bulging.  Nose: No mucosal edema or rhinorrhea. Right sinus exhibits no maxillary sinus tenderness and no frontal sinus tenderness. Left sinus exhibits no maxillary sinus tenderness and no frontal sinus tenderness.  Mouth/Throat: Uvula is midline, oropharynx is clear and moist and mucous membranes are normal.  Eyes: Pupils are equal, round, and reactive to light. Conjunctivae, EOM and lids are normal. Lids are everted and swept, no foreign bodies found.  Neck: Trachea normal and normal range of motion. Neck supple. Carotid bruit is not present. No thyroid mass and no thyromegaly present.  Cardiovascular: Normal rate, regular rhythm, S1 normal, S2 normal, normal heart sounds, intact distal pulses and normal pulses. Exam reveals no gallop and no friction rub.  No murmur heard. Pulmonary/Chest: Effort normal and breath sounds normal. No tachypnea. No respiratory distress. She has no decreased breath sounds. She has no wheezes. She has no rhonchi. She has no rales.  Abdominal: Soft. Normal appearance and bowel sounds are normal.  There is no tenderness.  Neurological: She is alert.  Skin: Skin is warm, dry and intact. No rash noted.  Psychiatric: Her speech is normal and  behavior is normal. Judgment and thought content normal. Her mood appears not anxious. Cognition and memory are normal. She does not exhibit a depressed mood.     Diabetic foot exam: Normal inspection No skin breakdown No calluses  Normal DP pulses Normal sensation to light touch and monofilament Nails normal      Assessment & Plan:

## 2018-02-24 NOTE — Patient Instructions (Addendum)
Try increasing victoza to 1.2 mg daily.. Call if refilled need at this dose.  Stay off fenofibrate.  Start atorvastatin daily for cholesterol control. Quit smoking!

## 2018-03-03 ENCOUNTER — Other Ambulatory Visit: Payer: Self-pay | Admitting: Family Medicine

## 2018-03-31 NOTE — Assessment & Plan Note (Signed)
Improving control but not at goal... Increase liraglutide to 1.2 mg daily. Close follow up.

## 2018-03-31 NOTE — Assessment & Plan Note (Signed)
Given statin indicated in DM... Will have her remain off fenofibrate and instead treat trigs   And CVD risk with atorvastatin moderate dose.

## 2018-03-31 NOTE — Assessment & Plan Note (Signed)
Well controlled. Continue current medication. Encouraged exercise, weight loss, healthy eating habits.  

## 2018-07-03 ENCOUNTER — Other Ambulatory Visit: Payer: Self-pay | Admitting: Family Medicine

## 2018-07-04 ENCOUNTER — Telehealth: Payer: Self-pay | Admitting: Family Medicine

## 2018-07-04 NOTE — Telephone Encounter (Signed)
Left message asking pt to call office   Please schedule office visit with Dr. Diona Browner to follow up on Diabetes. She was suppose to follow up in Feb.   Thanks,  Butch Penny

## 2018-07-11 ENCOUNTER — Ambulatory Visit: Payer: 59 | Admitting: Family Medicine

## 2018-07-11 NOTE — Telephone Encounter (Signed)
Appointment 3/17

## 2018-07-17 ENCOUNTER — Other Ambulatory Visit: Payer: Self-pay

## 2018-07-17 MED ORDER — LIRAGLUTIDE 18 MG/3ML ~~LOC~~ SOPN: 1.2000 mg | PEN_INJECTOR | Freq: Every day | SUBCUTANEOUS | 0 refills | Status: DC

## 2018-08-07 MED ORDER — ONETOUCH DELICA LANCETS 33G MISC: 3 refills | Status: AC

## 2018-08-07 MED ORDER — ONETOUCH DELICA LANCETS 33G MISC: 3 refills | Status: DC

## 2018-08-07 MED ORDER — GLUCOSE BLOOD VI STRP: ORAL_STRIP | 3 refills | Status: DC

## 2018-08-07 MED ORDER — GLUCOSE BLOOD VI STRP: ORAL_STRIP | 3 refills | Status: AC

## 2018-08-10 ENCOUNTER — Other Ambulatory Visit: Payer: Self-pay | Admitting: Family Medicine

## 2018-08-10 ENCOUNTER — Telehealth: Payer: Self-pay | Admitting: Family Medicine

## 2018-08-10 NOTE — Telephone Encounter (Signed)
Spoke with pt she will check with work and call back to schedule

## 2018-08-10 NOTE — Telephone Encounter (Signed)
Left message asking pt to call office  Can you try and schedule virtual appointment to follow up diabetes with lab appointment prior with Dr. Diona Browner.

## 2018-08-13 ENCOUNTER — Encounter: Payer: Self-pay | Admitting: Obstetrics & Gynecology

## 2018-08-15 ENCOUNTER — Other Ambulatory Visit: Payer: Self-pay

## 2018-08-15 ENCOUNTER — Encounter: Payer: Self-pay | Admitting: Obstetrics & Gynecology

## 2018-08-15 ENCOUNTER — Ambulatory Visit (INDEPENDENT_AMBULATORY_CARE_PROVIDER_SITE_OTHER): Payer: 59 | Admitting: Obstetrics & Gynecology

## 2018-08-15 VITALS — BP 120/80 | Ht 66.0 in | Wt 167.0 lb

## 2018-08-15 DIAGNOSIS — N6012 Diffuse cystic mastopathy of left breast: Secondary | ICD-10-CM | POA: Insufficient documentation

## 2018-08-15 DIAGNOSIS — N644 Mastodynia: Secondary | ICD-10-CM | POA: Diagnosis not present

## 2018-08-15 NOTE — Patient Instructions (Signed)
Fibrocystic Breast Changes  Fibrocystic breast changes are changes in breast tissue that can cause breasts to become swollen, lumpy, or painful. This can happen due to buildup of scar-like tissue (fibrous tissue) or the forming of fluid-filled lumps (cysts) in the breast. This is a common condition, and it is not cancerous (is benign). The exact cause is not known, but it seems to occur when women go through hormonal changes during their menstrual cycle. Fibrocystic breast changes can affect one or both breasts. What are the causes? The exact cause of fibrocystic breast changes is not known. However, this condition:  May be related to the female hormones estrogen and progesterone.  May be influenced by family traits that get passed from parent to child (genetics). What are the signs or symptoms? Symptoms of this condition may affect one or both breasts, and may include:  Tenderness, mild discomfort, or pain.  Swelling.  Rope-like tissue that can be felt when touching the breast.  Lumps in one or both breasts.  Changes in breast size. Breasts may get larger before the menstrual period and smaller after the menstrual period.  Green or dark brown discharge from the nipple. Symptoms are usually worse before menstrual periods start, and they get better toward the end of menstrual periods. How is this diagnosed? This condition is diagnosed based on your medical history and a physical exam of your breasts. You may also have tests, such as:  A breast X-ray (mammogram).  Ultrasound of your breasts.  MRI.  Removal of a breast tissue sample for testing (breast biopsy). This may be done if your health care provider thinks that something else may be causing changes in your breasts. How is this treated? Often, treatment is not needed for this condition. In some cases, treatment may include:  Taking over-the-counter pain relievers to help lessen pain or discomfort.  Limiting or avoiding  caffeine. Foods and beverages that contain caffeine include chocolate, soda, coffee, and tea.  Reducing sugar and fat in your diet. Your health care provider may also recommend:  A procedure to remove fluid from a cyst that is causing pain (fine needle aspiration).  Surgery to remove a cyst that is large or tender or does not go away. Follow these instructions at home:  Examine your breasts after every menstrual period. If you do not have menstrual periods, check your breasts on the first day of every month. Feel for changes in your breasts, such as: ? More tenderness. ? A new growth. ? A change in size. ? A change in an existing lump.  Take over-the-counter and prescription medicines only as told by your health care provider.  Wear a well-fitted support or sports bra, especially when exercising.  Decrease or avoid caffeine, fat, and sugar in your diet as directed by your health care provider. Contact a health care provider if:  You have fluid leaking from your nipple, especially if it is bloody.  You have new lumps or bumps in your breast.  Your breast becomes enlarged, red, and painful.  You have areas of your breast that pucker inward.  Your nipple appears flat or indented. Get help right away if:  You have redness of your breast and the redness is spreading. Summary  Fibrocystic breast changes are changes in breast tissue that can cause breasts to become swollen, lumpy, or painful.  This condition may be related to the female hormones estrogen and progesterone.  With this condition, it is important to examine your breasts after every   menstrual period. If you do not have menstrual periods, check your breasts on the first day of every month. This information is not intended to replace advice given to you by your health care provider. Make sure you discuss any questions you have with your health care provider. Document Released: 01/27/2006 Document Revised: 12/23/2015  Document Reviewed: 12/10/2015 Elsevier Interactive Patient Education  2019 [employer].

## 2018-08-15 NOTE — Progress Notes (Signed)
HPI:      [name] is a 48 y.o. [id number] who is perimenopausal, presents today for a problem visit.  She complains of breast tenderness on the leftside which she first noticed one month ago, worse feeling swelling or mass now for the last 3 days; prioir h/o Alyssa Warren more so on right breast w two prior cyst aspiration years ago.  It has increased.  Associated symptoms include none.  Denies nipple discharge or skin changes.  No fever.  Prior Mammogram: 10/2017 normal. Prior breast problems: Yes Family History: Breast Cancer-relatedfamily history includes Breast cancer in her maternal aunt and paternal aunt.  PMHx: She  has a past medical history of Allergy, Arthritis, Diabetes mellitus without complication ([place]), GERD (gastroesophageal reflux disease), History of kidney stones, Hypertension, MVP (mitral valve prolapse), and PCOS (polycystic ovarian syndrome). Also,  has a past surgical history that includes Tubal ligation; plantar fasciitis release (Bilateral); Knee surgery (Left); cyst removed from ovary; Breast biopsy (Right, 02/2012); Laparoscopic salpingo oophorectomy (Left, 12/23/2016); Dilatation & currettage/hysteroscopy with novasure ablation (N/A, 12/23/2016); Labioplasty (N/A, 07/12/2017); Laparoscopic hysterectomy (Bilateral, 11/01/2017); and Cystoscopy (11/01/2017)., family history includes Breast cancer in her maternal aunt and paternal aunt.,  reports that she has been smoking cigarettes. She has a 4.50 pack-year smoking history. She has never used smokeless tobacco. She reports that she does not drink alcohol or use drugs.  She has a current medication list which includes the following prescription(s): acetaminophen, atorvastatin, one touch ultra system kit, glipizide, glucose blood, insulin pen needle, liraglutide, losartan-hydrochlorothiazide, metformin, multivitamin with minerals, nicotine, onetouch delica lancets 74Y, valacyclovir, and venlafaxine xr. Also, is allergic to  penicillins; keflex [cephalexin]; and methyldopa.  Review of Systems  Constitutional: Negative for chills, fever and malaise/fatigue.  HENT: Negative for congestion, sinus pain and sore throat.   Eyes: Negative for blurred vision and pain.  Respiratory: Negative for cough and wheezing.   Cardiovascular: Negative for chest pain and leg swelling.  Gastrointestinal: Negative for abdominal pain, constipation, diarrhea, heartburn, nausea and vomiting.  Genitourinary: Negative for dysuria, frequency, hematuria and urgency.  Musculoskeletal: Negative for back pain, joint pain, myalgias and neck pain.  Skin: Negative for itching and rash.  Neurological: Negative for dizziness, tremors and weakness.  Endo/Heme/Allergies: Does not bruise/bleed easily.  Psychiatric/Behavioral: Negative for depression. The patient is not nervous/anxious and does not have insomnia.     Objective: BP 120/80   Ht '5\' 6"'  (1.676 m)   Wt 167 lb (75.8 kg)   LMP 10/12/2017   BMI 26.95 kg/m  Physical Exam Constitutional:      General: She is not in acute distress.    Appearance: Normal appearance. She is well-developed.  Cardiovascular:     Rate and Rhythm: Normal rate and regular rhythm.     Pulses: Normal pulses.     Heart sounds: Normal heart sounds. No murmur. No friction rub. No gallop.   Pulmonary:     Effort: Pulmonary effort is normal.     Breath sounds: Normal breath sounds.  Chest:     Chest wall: No mass, tenderness or edema.     Breasts:        Right: No inverted nipple, mass, nipple discharge, skin change or tenderness.        Left: Tenderness present. No inverted nipple, mass, nipple discharge or skin change.       Comments: FCC palpable in L UIQ Musculoskeletal: Normal range of motion.  Lymphadenopathy:     Upper Body:  Right upper body: No pectoral adenopathy.     Left upper body: No pectoral adenopathy.  Neurological:     Mental Status: She is alert and oriented to person, place, and  time.  Skin:    General: Skin is warm and dry.     Findings: No abrasion, bruising, erythema, lesion or rash.  Psychiatric:        Speech: Speech normal.        Behavior: Behavior normal.        Judgment: Judgment normal.  Vitals signs reviewed.     ASSESSMENT/PLAN: mastalgia and mastalgia, likely due to fibrocystic changes 1. Mastalgia in female - MM DIAG BREAST TOMO BILATERAL; Future - US BREAST LTD UNI LEFT INC AXILLA; Future  2. Fibrocystic breast changes, left - MM DIAG BREAST TOMO BILATERAL; Future - US BREAST LTD UNI LEFT INC AXILLA; Future  Reassured as to exam, low liklihood for cancer  NSAIDs for pain discussed Also, heat, compression  Barnett Applebaum, MD, [facility] 08/15/2018  4:30 PM

## 2018-08-16 ENCOUNTER — Other Ambulatory Visit: Payer: Self-pay | Admitting: Obstetrics & Gynecology

## 2018-08-16 DIAGNOSIS — N644 Mastodynia: Secondary | ICD-10-CM

## 2018-08-21 ENCOUNTER — Other Ambulatory Visit: Payer: Self-pay

## 2018-08-21 ENCOUNTER — Ambulatory Visit
Admission: RE | Admit: 2018-08-21 | Discharge: 2018-08-21 | Disposition: A | Discharge status: Home / Self Care | Payer: 59 | Source: Ambulatory Visit | Attending: Obstetrics & Gynecology | Admitting: Obstetrics & Gynecology

## 2018-08-21 DIAGNOSIS — N644 Mastodynia: Secondary | ICD-10-CM | POA: Diagnosis not present

## 2018-08-21 DIAGNOSIS — N6012 Diffuse cystic mastopathy of left breast: Secondary | ICD-10-CM | POA: Insufficient documentation

## 2018-08-21 DIAGNOSIS — N6323 Unspecified lump in the left breast, lower outer quadrant: Secondary | ICD-10-CM | POA: Diagnosis not present

## 2018-09-04 ENCOUNTER — Telehealth: Payer: Self-pay | Admitting: Family Medicine

## 2018-09-04 MED ORDER — VENLAFAXINE HCL ER 37.5 MG PO CP24: ORAL_CAPSULE | ORAL | 0 refills | Status: DC

## 2018-09-04 NOTE — Telephone Encounter (Signed)
Refill sent as requested. 

## 2018-09-04 NOTE — Telephone Encounter (Signed)
Patient is requesting a refill on generic Effexor. Patient needs a 30 day supply sent to [facility].

## 2018-09-05 ENCOUNTER — Telehealth: Payer: Self-pay | Admitting: Family Medicine

## 2018-09-05 DIAGNOSIS — E1165 Type 2 diabetes mellitus with hyperglycemia: Secondary | ICD-10-CM

## 2018-09-05 NOTE — Telephone Encounter (Signed)
-----   Message from Cloyd Stagers, RT sent at 09/04/2018  2:21 PM EDT ----- Regarding: Lab Orders for Wednesday 5.13.2020 Please place lab orders for Wednesday 5.13.2020 Thank you, Dyke Maes RT(R)

## 2018-09-06 ENCOUNTER — Other Ambulatory Visit (INDEPENDENT_AMBULATORY_CARE_PROVIDER_SITE_OTHER): Payer: 59

## 2018-09-06 DIAGNOSIS — E1165 Type 2 diabetes mellitus with hyperglycemia: Secondary | ICD-10-CM | POA: Diagnosis not present

## 2018-09-06 LAB — COMPREHENSIVE METABOLIC PANEL
ALT: 34 U/L (ref 0–35)
AST: 27 U/L (ref 0–37)
Albumin: 4.6 g/dL (ref 3.5–5.2)
Alkaline Phosphatase: 70 U/L (ref 39–117)
BUN: 11 mg/dL (ref 6–23)
CO2: 29 mEq/L (ref 19–32)
Calcium: 9.5 mg/dL (ref 8.4–10.5)
Chloride: 97 mEq/L (ref 96–112)
Creatinine, Ser: 0.68 mg/dL (ref 0.40–1.20)
GFR: 92.1 mL/min (ref >60.00)
Glucose, Bld: 265 mg/dL — ABNORMAL HIGH (ref 70–99)
Potassium: 3.8 mEq/L (ref 3.5–5.1)
Sodium: 137 mEq/L (ref 135–145)
Total Bilirubin: 0.7 mg/dL (ref 0.2–1.2)
Total Protein: 7.3 g/dL (ref 6.0–8.3)

## 2018-09-06 LAB — HEMOGLOBIN A1C: Hgb A1c MFr Bld: 8.8 % — ABNORMAL HIGH (ref 4.6–6.5)

## 2018-09-07 ENCOUNTER — Ambulatory Visit (INDEPENDENT_AMBULATORY_CARE_PROVIDER_SITE_OTHER): Payer: 59 | Admitting: Family Medicine

## 2018-09-07 ENCOUNTER — Encounter: Payer: Self-pay | Admitting: Family Medicine

## 2018-09-07 VITALS — Temp 97.9°F | Ht 66.0 in | Wt 162.0 lb

## 2018-09-07 DIAGNOSIS — F320 Major depressive disorder, single episode, mild: Secondary | ICD-10-CM | POA: Diagnosis not present

## 2018-09-07 DIAGNOSIS — I10 Essential (primary) hypertension: Secondary | ICD-10-CM

## 2018-09-07 DIAGNOSIS — E1165 Type 2 diabetes mellitus with hyperglycemia: Secondary | ICD-10-CM | POA: Diagnosis not present

## 2018-09-07 MED ORDER — INSULIN PEN NEEDLE 31G X 8 MM MISC: 3 refills | Status: DC

## 2018-09-07 MED ORDER — VENLAFAXINE HCL ER 37.5 MG PO CP24: ORAL_CAPSULE | ORAL | 1 refills | Status: DC

## 2018-09-07 NOTE — Assessment & Plan Note (Signed)
Good control on effexor.. refilled.

## 2018-09-07 NOTE — Assessment & Plan Note (Signed)
Well controlled. Continue current medication.  

## 2018-09-07 NOTE — Assessment & Plan Note (Signed)
Worsened control.. due to increase stress and poor diet.  She is more active lately .Marland Kitchen ashe has lost 5-7 lbs.  Some nausea occ , minimally bothersome with victoza... wishes to continue. No Abd pain.  Re-eval in 3 months.. if not at goal increase victoza if tolerating better.

## 2018-09-07 NOTE — Progress Notes (Signed)
VIRTUAL VISIT Due to national recommendations of social distancing due to [place] 19, a virtual visit is felt to be most appropriate for this patient at this time.   I connected with the patient on 09/07/18 at  3:20 PM EDT by virtual telehealth platform and verified that I am speaking with the correct person using two identifiers.   I discussed the limitations, risks, security and privacy concerns of performing an evaluation and management service by  virtual telehealth platform and the availability of in person appointments. I also discussed with the patient that there may be a patient responsible charge related to this service. The patient expressed understanding and agreed to proceed.  Patient location: Home Provider Location: [facility] Participants: Amy Diona Browner and Birdena Jubilee   Chief Complaint  Patient presents with  . Diabetes  . Emesis    in the morning on average of once a week    History of Present Illness: 48 year old female presents for follow up DM, HTN  Diabetes:   Worsened control despite glipizide , metformin and victoza... she has had some more soft drinks lately. Increase in stress.  At last OV increase liraglutide to 1.2 mg daily  She does have some weekly nausea.. resulting in emesis, no abdominal pain. Lab Results  Component Value Date   HGBA1C 8.8 (H) 09/06/2018  Using medications without difficulties: Hypoglycemic episodes: Hyperglycemic episodes: Feet problems:none Blood Sugars averaging: not checking. eye exam within last year: due  She has lost 5-7 lbs. Wt Readings from Last 3 Encounters:  09/07/18 162 lb (73.5 kg)  08/15/18 167 lb (75.8 kg)  02/24/18 167 lb 4 oz (75.9 kg)     Hypertension:   good control on  Losartan HCTZ BP Readings from Last 3 Encounters:  08/15/18 120/80  02/24/18 120/84  12/12/17 138/90  Using medication without problems or lightheadedness: none Chest pain with exertion:none Edema:none Short of  breath:none Average home BPs: Other issues:   COVID 19 screen No recent travel or known exposure to [place] The patient denies respiratory symptoms of COVID 19 at this time.  The importance of social distancing was discussed today.   Review of Systems  Constitutional: Negative for chills and fever.  HENT: Negative for congestion and ear pain.   Eyes: Negative for pain and redness.  Respiratory: Negative for cough and shortness of breath.   Cardiovascular: Negative for chest pain, palpitations and leg swelling.  Gastrointestinal: Negative for abdominal pain, blood in stool, constipation, diarrhea, nausea and vomiting.  Genitourinary: Negative for dysuria.  Musculoskeletal: Negative for falls and myalgias.  Skin: Negative for rash.  Neurological: Negative for dizziness.  Psychiatric/Behavioral: Negative for depression. The patient is not nervous/anxious.       Past Medical History:  Diagnosis Date  . Allergy   . Arthritis    LEFT KNEE  . Diabetes mellitus without complication ([place])   . GERD (gastroesophageal reflux disease)    OCC  . History of kidney stones    H/O  . Hypertension   . MVP (mitral valve prolapse)   . PCOS (polycystic ovarian syndrome)     reports that she has been smoking cigarettes. She has a 4.50 pack-year smoking history. She has never used smokeless tobacco. She reports that she does not drink alcohol or use drugs.   Current Outpatient Medications:  .  acetaminophen (TYLENOL) 500 MG tablet, Take 1,000 mg by mouth 2 (two) times daily as needed for moderate pain., Disp: , Rfl:  .  Blood Glucose Monitoring Suppl (ONE TOUCH ULTRA SYSTEM KIT) w/Device KIT, Use to check blood sugar one to two times daily.  Dx: E11.9, Disp: 1 each, Rfl: 0 .  glipiZIDE (GLUCOTROL XL) 10 MG 24 hr tablet, TAKE 1 TABLET BY MOUTH  DAILY WITH BREAKFAST, Disp: 90 tablet, Rfl: 0 .  glucose blood (ONE TOUCH ULTRA TEST) test strip, Use to check blood sugar one to two times daily.  Dx:  E11.9, Disp: 200 each, Rfl: 3 .  Insulin Pen Needle 31G X 8 MM MISC, Use to inject Victoza daily.  Dx: E11.65, Disp: 90 each, Rfl: 3 .  liraglutide (VICTOZA) 18 MG/3ML SOPN, Inject 0.2 mLs (1.2 mg total) into the skin daily., Disp: 18 mL, Rfl: 0 .  losartan-hydrochlorothiazide (HYZAAR) 50-12.5 MG tablet, TAKE 1 TABLET BY MOUTH  DAILY, Disp: 90 tablet, Rfl: 0 .  metFORMIN (GLUCOPHAGE) 1000 MG tablet, TAKE 1 TABLET BY MOUTH TWO  TIMES DAILY WITH A MEAL, Disp: 180 tablet, Rfl: 0 .  Multiple Vitamin (MULTIVITAMIN WITH MINERALS) TABS tablet, Take 2 tablets by mouth daily with lunch., Disp: , Rfl:  .  OneTouch Delica Lancets 50K MISC, Use to check blood sugar one to two times daily.  Dx: E11.9, Disp: 200 each, Rfl: 3 .  valACYclovir (VALTREX) 1000 MG tablet, Take 1 tablet (1,000 mg total) by mouth 2 (two) times daily., Disp: 14 tablet, Rfl: 1 .  venlafaxine XR (EFFEXOR-XR) 37.5 MG 24 hr capsule, TAKE 1 CAPSULE BY MOUTH  DAILY WITH LUNCH, Disp: 30 capsule, Rfl: 0 .  atorvastatin (LIPITOR) 40 MG tablet, Take 1 tablet (40 mg total) by mouth daily. (Patient not taking: Reported on 09/07/2018), Disp: 90 tablet, Rfl: 3 .  nicotine (NICODERM CQ - DOSED IN MG/24 HOURS) 14 mg/24hr patch, Place 1 patch (14 mg total) onto the skin daily. (Patient not taking: Reported on 09/07/2018), Disp: 28 patch, Rfl: 0   Observations/Objective: Temperature 97.9 F (36.6 C), temperature source Oral, height '5\' 6"'  (1.676 m), weight 162 lb (73.5 kg), last menstrual period 10/12/2017.  Physical Exam  Physical Exam Constitutional:      General: The patient is not in acute distress. Pulmonary:     Effort: Pulmonary effort is normal. No respiratory distress.  Neurological:     Mental Status: The patient is alert and oriented to person, place, and time.  Psychiatric:        Mood and Affect: Mood normal.        Behavior: Behavior normal.   Assessment and Plan Poorly controlled type 2 diabetes mellitus Worsened control.. due to  increase stress and poor diet.  She is more active lately .Marland Kitchen ashe has lost 5-7 lbs.  Some nausea occ , minimally bothersome with victoza... wishes to continue. No Abd pain.  Re-eval in 3 months.. if not at goal increase victoza if tolerating better.  Essential hypertension, benign Well controlled. Continue current medication.   Major depressive disorder, single episode, mild Good control on effexor.. refilled.     I discussed the assessment and treatment plan with the patient. The patient was provided an opportunity to ask questions and all were answered. The patient agreed with the plan and demonstrated an understanding of the instructions.   The patient was advised to call back or seek an in-person evaluation if the symptoms worsen or if the condition fails to improve as anticipated.     Eliezer Lofts, MD

## 2018-09-07 NOTE — Patient Instructions (Addendum)
Set up yearly eye exam.  Continue current dose of victoza and metformin and glipizide  Work on increasing exercise, decrease stress as able, stop soda amnd work low carb diet.  Get back to checking blood sugar daily.

## 2018-09-08 NOTE — Progress Notes (Signed)
Labs 8/4 °Follow up 8/7 °

## 2018-09-22 ENCOUNTER — Other Ambulatory Visit: Payer: Self-pay | Admitting: Family Medicine

## 2018-09-27 ENCOUNTER — Other Ambulatory Visit: Payer: Self-pay

## 2018-09-27 MED ORDER — LOSARTAN POTASSIUM-HCTZ 50-12.5 MG PO TABS: 1.0000 | ORAL_TABLET | Freq: Every day | ORAL | 0 refills | Status: DC

## 2018-09-27 MED ORDER — GLIPIZIDE ER 10 MG PO TB24: 10.0000 mg | ORAL_TABLET | Freq: Every day | ORAL | 1 refills | Status: DC

## 2018-09-27 MED ORDER — GLIPIZIDE ER 10 MG PO TB24: 10.0000 mg | ORAL_TABLET | Freq: Every day | ORAL | 0 refills | Status: DC

## 2018-09-27 MED ORDER — LOSARTAN POTASSIUM-HCTZ 50-12.5 MG PO TABS: 1.0000 | ORAL_TABLET | Freq: Every day | ORAL | 1 refills | Status: DC

## 2018-10-20 ENCOUNTER — Other Ambulatory Visit: Payer: Self-pay | Admitting: Family Medicine

## 2018-11-18 ENCOUNTER — Other Ambulatory Visit: Payer: Self-pay | Admitting: Family Medicine

## 2018-11-28 ENCOUNTER — Other Ambulatory Visit: Payer: 59

## 2018-11-29 ENCOUNTER — Other Ambulatory Visit (INDEPENDENT_AMBULATORY_CARE_PROVIDER_SITE_OTHER): Payer: 59

## 2018-11-29 ENCOUNTER — Other Ambulatory Visit: Payer: Self-pay | Admitting: Family Medicine

## 2018-11-29 ENCOUNTER — Other Ambulatory Visit: Payer: Self-pay

## 2018-11-29 DIAGNOSIS — E1165 Type 2 diabetes mellitus with hyperglycemia: Secondary | ICD-10-CM | POA: Diagnosis not present

## 2018-11-29 LAB — COMPREHENSIVE METABOLIC PANEL
ALT: 23 U/L (ref 0–35)
AST: 18 U/L (ref 0–37)
Albumin: 4.3 g/dL (ref 3.5–5.2)
Alkaline Phosphatase: 67 U/L (ref 39–117)
BUN: 9 mg/dL (ref 6–23)
CO2: 31 mEq/L (ref 19–32)
Calcium: 9.7 mg/dL (ref 8.4–10.5)
Chloride: 100 mEq/L (ref 96–112)
Creatinine, Ser: 0.65 mg/dL (ref 0.40–1.20)
GFR: 96.93 mL/min (ref >60.00)
Glucose, Bld: 184 mg/dL — ABNORMAL HIGH (ref 70–99)
Potassium: 4.5 mEq/L (ref 3.5–5.1)
Sodium: 139 mEq/L (ref 135–145)
Total Bilirubin: 0.7 mg/dL (ref 0.2–1.2)
Total Protein: 6.7 g/dL (ref 6.0–8.3)

## 2018-11-29 LAB — LIPID PANEL
Cholesterol: 303 mg/dL — ABNORMAL HIGH (ref 0–200)
HDL: 37.3 mg/dL — ABNORMAL LOW (ref >39.00)
Total CHOL/HDL Ratio: 8
Triglycerides: 1050 mg/dL — ABNORMAL HIGH (ref 0.0–149.0)

## 2018-11-29 LAB — LDL CHOLESTEROL, DIRECT: Direct LDL: 94 mg/dL

## 2018-11-29 MED ORDER — FENOFIBRATE 145 MG PO TABS: 145.0000 mg | ORAL_TABLET | Freq: Every day | ORAL | 11 refills | Status: DC

## 2018-11-29 NOTE — Addendum Note (Signed)
Addended by: Ellamae Sia on: 11/29/2018 01:50 PM   Modules accepted: Orders

## 2018-12-01 ENCOUNTER — Other Ambulatory Visit: Payer: Self-pay

## 2018-12-01 ENCOUNTER — Ambulatory Visit: Payer: 59 | Admitting: Family Medicine

## 2018-12-01 DIAGNOSIS — E1165 Type 2 diabetes mellitus with hyperglycemia: Secondary | ICD-10-CM | POA: Diagnosis not present

## 2018-12-01 DIAGNOSIS — E785 Hyperlipidemia, unspecified: Secondary | ICD-10-CM | POA: Diagnosis not present

## 2018-12-01 DIAGNOSIS — E1169 Type 2 diabetes mellitus with other specified complication: Secondary | ICD-10-CM | POA: Diagnosis not present

## 2018-12-01 LAB — HM DIABETES FOOT EXAM

## 2018-12-01 LAB — POCT GLYCOSYLATED HEMOGLOBIN (HGB A1C): Hemoglobin A1C: 8.1 % — AB (ref 4.0–5.6)

## 2018-12-01 LAB — HM DIABETES EYE EXAM

## 2018-12-01 MED ORDER — PRAVASTATIN SODIUM 10 MG PO TABS: 10.0000 mg | ORAL_TABLET | Freq: Every day | ORAL | 3 refills | Status: DC

## 2018-12-01 NOTE — Patient Instructions (Addendum)
Work on low [employer].  Goal FBS < 120, 2 hour post prandial < 180.   Call if interested in increase victoza before next OV.  Start fenofibrate.  Start low pravastatin M, W, F.  Can try coQ10 OTC for muscle ache prevention  With statin

## 2018-12-01 NOTE — Progress Notes (Signed)
Chief Complaint  Patient presents with  . Diabetes    History of Present Illness: HPI   48 year old female presents for DM and chol follow up  Diabetes:   Improved some from 8.8.Marland Kitchen but this A1C is only 2 months out.  on victoza 1.2 mcg daily , metformin and glipizide. Lab Results  Component Value Date   HGBA1C 8.1 (A) 12/01/2018  Using medications without difficulties: Hypoglycemic episodes:none Hyperglycemic episodes:none Feet problems:no ulcer Blood Sugars averaging: fbs at labs 184.. 120-180 eye exam within last year: 12/01/2018 Wt Readings from Last 3 Encounters:  12/01/18 161 lb 12 oz (73.4 kg)  09/07/18 162 lb (73.5 kg)  08/15/18 167 lb (75.8 kg)     Increased stress.  Elevated Cholesterol:  Poor control...  Off fenofibrate. Lab Results  Component Value Date   CHOL 303 (H) 11/29/2018   HDL 37.30 (L) 11/29/2018   LDLCALC 26 06/29/2013   LDLDIRECT 94.0 11/29/2018   TRIG (H) 11/29/2018    1050.0 Triglyceride is over 400; calculations on Lipids are invalid.   CHOLHDL 8 11/29/2018  Using medications without problems: Muscle aches:  Diet compliance: poor Exercise: occ Other complaints:    COVID 19 screen No recent travel or known exposure to COVID19 The patient denies respiratory symptoms of COVID 19 at this time.  The importance of social distancing was discussed today.   Review of Systems  Constitutional: Negative for chills and fever.  HENT: Negative for congestion and ear pain.   Eyes: Negative for pain and redness.  Respiratory: Negative for cough and shortness of breath.   Cardiovascular: Negative for chest pain, palpitations and leg swelling.  Gastrointestinal: Negative for abdominal pain, blood in stool, constipation, diarrhea, nausea and vomiting.  Genitourinary: Negative for dysuria.  Musculoskeletal: Negative for falls and myalgias.  Skin: Negative for rash.  Neurological: Negative for dizziness.  Psychiatric/Behavioral: Negative for depression.  The patient is not nervous/anxious.       Past Medical History:  Diagnosis Date  . Allergy   . Arthritis    LEFT KNEE  . Diabetes mellitus without complication ([place])   . GERD (gastroesophageal reflux disease)    OCC  . History of kidney stones    H/O  . Hypertension   . MVP (mitral valve prolapse)   . PCOS (polycystic ovarian syndrome)     reports that she has been smoking cigarettes. She has a 4.50 pack-year smoking history. She has never used smokeless tobacco. She reports that she does not drink alcohol or use drugs.   Current Outpatient Medications:  .  acetaminophen (TYLENOL) 500 MG tablet, Take 1,000 mg by mouth 2 (two) times daily as needed for moderate pain., Disp: , Rfl:  .  Blood Glucose Monitoring Suppl (ONE TOUCH ULTRA SYSTEM KIT) w/Device KIT, Use to check blood sugar one to two times daily.  Dx: E11.9, Disp: 1 each, Rfl: 0 .  fenofibrate (TRICOR) 145 MG tablet, Take 1 tablet (145 mg total) by mouth daily., Disp: 30 tablet, Rfl: 11 .  glipiZIDE (GLUCOTROL XL) 10 MG 24 hr tablet, Take 1 tablet (10 mg total) by mouth daily with breakfast., Disp: 90 tablet, Rfl: 1 .  glucose blood (ONE TOUCH ULTRA TEST) test strip, Use to check blood sugar one to two times daily.  Dx: E11.9, Disp: 200 each, Rfl: 3 .  Insulin Pen Needle 31G X 8 MM MISC, Use to inject Victoza daily.  Dx: E11.65, Disp: 90 each, Rfl: 3 .  losartan-hydrochlorothiazide (HYZAAR) 50-12.5 MG  tablet, Take 1 tablet by mouth daily., Disp: 90 tablet, Rfl: 1 .  metFORMIN (GLUCOPHAGE) 1000 MG tablet, TAKE 1 TABLET BY MOUTH TWO  TIMES DAILY WITH A MEAL, Disp: 180 tablet, Rfl: 0 .  Multiple Vitamin (MULTIVITAMIN WITH MINERALS) TABS tablet, Take 2 tablets by mouth daily with lunch., Disp: , Rfl:  .  nicotine (NICODERM CQ - DOSED IN MG/24 HOURS) 14 mg/24hr patch, Place 1 patch (14 mg total) onto the skin daily., Disp: 28 patch, Rfl: 0 .  OneTouch Delica Lancets 96E MISC, Use to check blood sugar one to two times daily.  Dx:  E11.9, Disp: 200 each, Rfl: 3 .  valACYclovir (VALTREX) 1000 MG tablet, Take 1 tablet (1,000 mg total) by mouth 2 (two) times daily., Disp: 14 tablet, Rfl: 1 .  venlafaxine XR (EFFEXOR-XR) 37.5 MG 24 hr capsule, TAKE 1 CAPSULE BY MOUTH  DAILY WITH LUNCH, Disp: 90 capsule, Rfl: 1 .  VICTOZA 18 MG/3ML SOPN, INJECT SUBCUTANEOUSLY 1.2MG DAILY, Disp: 18 mL, Rfl: 0 .  atorvastatin (LIPITOR) 40 MG tablet, Take 1 tablet (40 mg total) by mouth daily. (Patient not taking: Reported on 12/01/2018), Disp: 90 tablet, Rfl: 3   Observations/Objective: Blood pressure 118/80, pulse (!) 105, temperature 98.5 F (36.9 C), temperature source Temporal, height '5\' 6"'  (1.676 m), weight 161 lb 12 oz (73.4 kg), last menstrual period 10/12/2017, SpO2 96 %.  Physical Exam Constitutional:      General: She is not in acute distress.    Appearance: Normal appearance. She is well-developed. She is not ill-appearing or toxic-appearing.  HENT:     Head: Normocephalic.     Right Ear: Hearing, tympanic membrane, ear canal and external ear normal. Tympanic membrane is not erythematous, retracted or bulging.     Left Ear: Hearing, tympanic membrane, ear canal and external ear normal. Tympanic membrane is not erythematous, retracted or bulging.     Nose: No mucosal edema or rhinorrhea.     Right Sinus: No maxillary sinus tenderness or frontal sinus tenderness.     Left Sinus: No maxillary sinus tenderness or frontal sinus tenderness.     Mouth/Throat:     Pharynx: Uvula midline.  Eyes:     General: Lids are normal. Lids are everted, no foreign bodies appreciated.     Conjunctiva/sclera: Conjunctivae normal.     Pupils: Pupils are equal, round, and reactive to light.  Neck:     Musculoskeletal: Normal range of motion and neck supple.     Thyroid: No thyroid mass or thyromegaly.     Vascular: No carotid bruit.     Trachea: Trachea normal.  Cardiovascular:     Rate and Rhythm: Normal rate and regular rhythm.     Pulses: Normal  pulses.     Heart sounds: Normal heart sounds, S1 normal and S2 normal. No murmur. No friction rub. No gallop.   Pulmonary:     Effort: Pulmonary effort is normal. No tachypnea or respiratory distress.     Breath sounds: Normal breath sounds. No decreased breath sounds, wheezing, rhonchi or rales.  Abdominal:     General: Bowel sounds are normal.     Palpations: Abdomen is soft.     Tenderness: There is no abdominal tenderness.  Skin:    General: Skin is warm and dry.     Findings: No rash.  Neurological:     Mental Status: She is alert.  Psychiatric:        Mood and Affect: Mood is not anxious or  depressed.        Speech: Speech normal.        Behavior: Behavior normal. Behavior is cooperative.        Thought Content: Thought content normal.        Judgment: Judgment normal.     Diabetic foot exam: Normal inspection No skin breakdown No calluses  Normal DP pulses Normal sensation to light touch and monofilament Nails normal  Assessment and Plan      Eliezer Lofts, MD

## 2018-12-08 MED ORDER — HYDROCORT-PRAMOXINE (PERIANAL) 2.5-1 % EX CREA: TOPICAL_CREAM | Freq: Three times a day (TID) | CUTANEOUS | 0 refills | Status: DC

## 2018-12-30 ENCOUNTER — Encounter: Payer: Self-pay | Admitting: Family Medicine

## 2018-12-30 NOTE — Assessment & Plan Note (Signed)
Work on low [employer].  Goal FBS < 120, 2 hour post prandial < 180.   Call if interested in increase victoza before next OV.

## 2018-12-30 NOTE — Assessment & Plan Note (Signed)
Start fenofibrate.  Start low pravastatin M, W, F.  Can try coQ10 OTC for muscle ache prevention  With statin

## 2019-01-03 MED ORDER — VENLAFAXINE HCL ER 37.5 MG PO CP24: ORAL_CAPSULE | ORAL | 0 refills | Status: DC

## 2019-01-03 MED ORDER — VICTOZA 18 MG/3ML ~~LOC~~ SOPN: PEN_INJECTOR | SUBCUTANEOUS | 0 refills | Status: DC

## 2019-01-17 MED ORDER — METFORMIN HCL 1000 MG PO TABS: ORAL_TABLET | ORAL | 0 refills | Status: DC

## 2019-01-19 ENCOUNTER — Other Ambulatory Visit: Payer: Self-pay | Admitting: Family Medicine

## 2019-01-19 DIAGNOSIS — Z1231 Encounter for screening mammogram for malignant neoplasm of breast: Secondary | ICD-10-CM

## 2019-01-25 MED ORDER — LOSARTAN POTASSIUM-HCTZ 50-12.5 MG PO TABS: 1.0000 | ORAL_TABLET | Freq: Every day | ORAL | 0 refills | Status: DC

## 2019-01-27 ENCOUNTER — Other Ambulatory Visit: Payer: Self-pay | Admitting: Family Medicine

## 2019-01-29 NOTE — Telephone Encounter (Signed)
Left message for Alyssa Warren to return call.  Need to see if patient is using [facility] or [facility] for her venlafaxine refills.

## 2019-01-30 ENCOUNTER — Other Ambulatory Visit: Payer: Self-pay | Admitting: *Deleted

## 2019-01-30 MED ORDER — VENLAFAXINE HCL ER 37.5 MG PO CP24: ORAL_CAPSULE | ORAL | 0 refills | Status: DC

## 2019-02-10 ENCOUNTER — Other Ambulatory Visit: Payer: Self-pay | Admitting: Family Medicine

## 2019-02-17 ENCOUNTER — Telehealth: Payer: Self-pay | Admitting: Family Medicine

## 2019-02-19 NOTE — Telephone Encounter (Signed)
Please schedule Alyssa Warren an appointment for around 03/03/2019 to follow up on her diabetes with fasting labs prior.

## 2019-02-19 NOTE — Telephone Encounter (Signed)
Left message asking pt to call office  °

## 2019-02-23 ENCOUNTER — Other Ambulatory Visit: Payer: Self-pay | Admitting: Family Medicine

## 2019-02-23 ENCOUNTER — Telehealth: Payer: Self-pay

## 2019-02-23 NOTE — Telephone Encounter (Signed)
Patient returned Robin's phone call in regards to scheduling a diabetes follow up. Patient said the soonest she could get in for this appt is 04/05/19 - due to her work schedule and not being able to get coverage. I scheduled patient for this date, and scheduled alb

## 2019-02-23 NOTE — Telephone Encounter (Signed)
**  continuation of previous note** I scheduled patient for labs also on 03/27/19.  Patient states she will call back if she is able to get off work on a sooner date. Thanks!

## 2019-02-24 ENCOUNTER — Other Ambulatory Visit: Payer: Self-pay | Admitting: Family Medicine

## 2019-02-26 NOTE — Telephone Encounter (Signed)
Labs 12/1 Follow 12/10

## 2019-03-16 ENCOUNTER — Telehealth: Payer: Self-pay

## 2019-03-16 NOTE — Telephone Encounter (Signed)
LVM w COVID screen, front door and back lab info 11.20.2020 [facility] 

## 2019-03-19 ENCOUNTER — Other Ambulatory Visit: Payer: Self-pay | Admitting: Family Medicine

## 2019-03-26 ENCOUNTER — Encounter: Payer: Self-pay | Admitting: Family Medicine

## 2019-03-26 ENCOUNTER — Ambulatory Visit (INDEPENDENT_AMBULATORY_CARE_PROVIDER_SITE_OTHER): Payer: 59 | Admitting: Family Medicine

## 2019-03-26 VITALS — Temp 98.1°F | Ht 66.0 in

## 2019-03-26 DIAGNOSIS — N200 Calculus of kidney: Secondary | ICD-10-CM

## 2019-03-26 MED ORDER — TAMSULOSIN HCL 0.4 MG PO CAPS: 0.4000 mg | ORAL_CAPSULE | Freq: Every day | ORAL | 0 refills | Status: DC

## 2019-03-26 MED ORDER — TRAMADOL HCL 50 MG PO TABS: 50.0000 mg | ORAL_TABLET | Freq: Three times a day (TID) | ORAL | 0 refills | Status: AC | PRN

## 2019-03-26 NOTE — Progress Notes (Signed)
Alyssa Warren T. Cyera Balboni, MD Primary Care and [place] at [facility] [place], [address] Phone: [phone]  FAX: [place] - 48 y.o. female  MRN [id number]  Date of Birth: 1970/05/05  Visit Date: 03/26/2019  PCP: Jinny Sanders, MD  Referred by: Jinny Sanders, MD Chief Complaint  Patient presents with  . Flank Pain    Right Side  . Nausea  . Diarrhea   Virtual Visit via Video Note:  I connected with  Birdena Jubilee on 03/26/2019  3:40 PM EST by a video enabled telemedicine application and verified that I am speaking with the correct person using two identifiers.   Location patient: home computer, tablet, or smartphone Location provider: work or home office Consent: Verbal consent directly obtained from [employer]. Persons participating in the virtual visit: patient, provider  I discussed the limitations of evaluation and management by telemedicine and the availability of in person appointments. The patient expressed understanding and agreed to proceed.  Interactive audio and video telecommunications were attempted between this provider and patient, however failed, due to patient having technical difficulties OR patient did not have access to video capability.  We continued and completed visit with audio only.   History of Present Illness:  Kidney stone: She presents with a primary complaint of right-sided flank pain.  She also has some nausea.  She does have a significant history for kidney stones in the past, she has not noticed any blood in her urine.  She tried some tramadol over the weekend, and this did help with her pain.  She has been trying to drink as much liquid as possible, but she continues to have 6 out of 10 pain  Started over the weekend. Water, motrin.    Review of Systems as above: See pertinent positives and pertinent negatives per HPI No acute distress  verbally  Past Medical History, Surgical History, Social History, Family History, Problem List, Medications, and Allergies have been reviewed and updated if relevant.   Observations/Objective/Exam:  An attempt was made to discern vital signs over the phone and per patient if applicable and possible.   General:    Alert, Oriented, appears well and in no acute distress HEENT:     Atraumatic, conjunctiva clear, no obvious abnormalities on inspection of external nose and ears.  Neck:    Normal movements of the head and neck Pulmonary:     On inspection no signs of respiratory distress, breathing rate appears normal, no obvious gross SOB, gasping or wheezing Cardiovascular:    No obvious cyanosis Musculoskeletal:    Moves all visible extremities without noticeable abnormality Psych / Neurological:     Pleasant and cooperative, no obvious depression or anxiety, speech and thought processing grossly intact  Assessment and Plan:    ICD-10-CM   1. Nephrolithiasis  N20.0    >15 minutes spent in face to face time with patient, >50% spent in counselling or coordination of care   Presumptive nephrolithiasis.  Increase fluid intake, Flomax, and pain.  At this point tramadol has worked well for pain.  Generally opioids cause nausea for her  I discussed the assessment and treatment plan with the patient. The patient was provided an opportunity to ask questions and all were answered. The patient agreed with the plan and demonstrated an understanding of the instructions.   The patient was advised to call back or seek an in-person evaluation if the  symptoms worsen or if the condition fails to improve as anticipated.  Follow-up: prn unless noted otherwise below No follow-ups on file.  Meds ordered this encounter  Medications  . tamsulosin (FLOMAX) 0.4 MG CAPS capsule    Sig: Take 1 capsule (0.4 mg total) by mouth daily.    Dispense:  30 capsule    Refill:  0  . traMADol (ULTRAM) 50 MG tablet     Sig: Take 1 tablet (50 mg total) by mouth every 8 (eight) hours as needed for up to 5 days for moderate pain.    Dispense:  20 tablet    Refill:  0   No orders of the defined types were placed in this encounter.   Signed,  Maud Deed. Sabriah Hobbins, MD

## 2019-03-27 ENCOUNTER — Other Ambulatory Visit: Payer: 59

## 2019-03-27 ENCOUNTER — Other Ambulatory Visit: Payer: Self-pay

## 2019-03-27 ENCOUNTER — Telehealth: Payer: Self-pay | Admitting: Family Medicine

## 2019-03-27 DIAGNOSIS — E1165 Type 2 diabetes mellitus with hyperglycemia: Secondary | ICD-10-CM

## 2019-03-27 NOTE — Telephone Encounter (Signed)
-----   Message from Ellamae Sia sent at 03/14/2019 10:16 AM EST ----- Regarding: Lab orders for Tuesday, 12.1.20 Lab orders for f/u

## 2019-03-28 ENCOUNTER — Encounter: Payer: Self-pay | Admitting: Family Medicine

## 2019-03-28 ENCOUNTER — Telehealth: Payer: Self-pay | Admitting: Family Medicine

## 2019-03-28 ENCOUNTER — Other Ambulatory Visit: Payer: Self-pay

## 2019-03-28 ENCOUNTER — Ambulatory Visit: Payer: 59 | Admitting: Family Medicine

## 2019-03-28 VITALS — BP 138/96 | HR 90 | Temp 98.2°F | Ht 66.0 in | Wt 162.2 lb

## 2019-03-28 DIAGNOSIS — R109 Unspecified abdominal pain: Secondary | ICD-10-CM

## 2019-03-28 DIAGNOSIS — R1032 Left lower quadrant pain: Secondary | ICD-10-CM

## 2019-03-28 LAB — HEPATIC FUNCTION PANEL
ALT: 29 U/L (ref 0–35)
AST: 19 U/L (ref 0–37)
Albumin: 4.5 g/dL (ref 3.5–5.2)
Alkaline Phosphatase: 69 U/L (ref 39–117)
Bilirubin, Direct: 0.1 mg/dL (ref 0.0–0.3)
Total Bilirubin: 0.5 mg/dL (ref 0.2–1.2)
Total Protein: 6.9 g/dL (ref 6.0–8.3)

## 2019-03-28 LAB — CBC WITH DIFFERENTIAL/PLATELET
Basophils Absolute: 0.1 10*3/uL (ref 0.0–0.1)
Basophils Relative: 0.7 % (ref 0.0–3.0)
Eosinophils Absolute: 0.2 10*3/uL (ref 0.0–0.7)
Eosinophils Relative: 2 % (ref 0.0–5.0)
HCT: 41.6 % (ref 36.0–46.0)
Hemoglobin: 14.1 g/dL (ref 12.0–15.0)
Lymphocytes Relative: 43 % (ref 12.0–46.0)
Lymphs Abs: 3.7 10*3/uL (ref 0.7–4.0)
MCHC: 33.8 g/dL (ref 30.0–36.0)
MCV: 91.7 fl (ref 78.0–100.0)
Monocytes Absolute: 0.5 10*3/uL (ref 0.1–1.0)
Monocytes Relative: 5.7 % (ref 3.0–12.0)
Neutro Abs: 4.2 10*3/uL (ref 1.4–7.7)
Neutrophils Relative %: 48.6 % (ref 43.0–77.0)
Platelets: 256 10*3/uL (ref 150.0–400.0)
RBC: 4.54 Mil/uL (ref 3.87–5.11)
RDW: 13.3 % (ref 11.5–15.5)
WBC: 8.6 10*3/uL (ref 4.0–10.5)

## 2019-03-28 LAB — POC URINALSYSI DIPSTICK (AUTOMATED)
Bilirubin, UA: NEGATIVE
Blood, UA: NEGATIVE
Glucose, UA: POSITIVE — AB
Leukocytes, UA: NEGATIVE
Nitrite, UA: NEGATIVE
Protein, UA: POSITIVE — AB
Spec Grav, UA: >=1.03 — AB (ref 1.010–1.025)
Urobilinogen, UA: 0.2 E.U./dL
pH, UA: 5.5 (ref 5.0–8.0)

## 2019-03-28 LAB — BASIC METABOLIC PANEL
BUN: 12 mg/dL (ref 6–23)
CO2: 29 mEq/L (ref 19–32)
Calcium: 9.3 mg/dL (ref 8.4–10.5)
Chloride: 99 mEq/L (ref 96–112)
Creatinine, Ser: 0.62 mg/dL (ref 0.40–1.20)
GFR: 102.23 mL/min (ref >60.00)
Glucose, Bld: 240 mg/dL — ABNORMAL HIGH (ref 70–99)
Potassium: 3.7 mEq/L (ref 3.5–5.1)
Sodium: 137 mEq/L (ref 135–145)

## 2019-03-28 LAB — LIPASE: Lipase: 73 U/L — ABNORMAL HIGH (ref 11.0–59.0)

## 2019-03-28 MED ORDER — CIPROFLOXACIN HCL 750 MG PO TABS: 750.0000 mg | ORAL_TABLET | Freq: Two times a day (BID) | ORAL | 0 refills | Status: AC

## 2019-03-28 MED ORDER — METRONIDAZOLE 500 MG PO TABS: 500.0000 mg | ORAL_TABLET | Freq: Three times a day (TID) | ORAL | 0 refills | Status: AC

## 2019-03-28 NOTE — Progress Notes (Signed)
Alyssa Warren T. Muhammed Teutsch, MD Primary Care and [place] at [facility] [place], [address] Phone: [phone]  FAX: [place] - 48 y.o. female  MRN [id number]  Date of Birth: [birth year]  Visit Date: 03/28/2019  PCP: Jinny Sanders, MD  Referred by: Jinny Sanders, MD  Chief Complaint  Patient presents with  . Flank Pain   Subjective:   Alyssa Warren is a 48 y.o. very pleasant female patient who presents with the following:  She is a real pleasant lady, and I saw her several days ago for presumptive nephrolithiasis and she has a history of nephrolithiasis, but her pain is not really changed at all and not gotten any better.  She is not had any blood in her urine that she can recall.  She has been able to eat and drink but can she continues to have some flank pain on the left.  She also has some notable left lower quadrant pain.  She is not having any nausea, vomiting or diarrhea.  She is breathing comfortably and has no shortness of breath or chest pain.  No better at all. No blood in uring.  No lifting or anything -she has not done anything physical at all to provoke muscular strain  LLQ pain  Past Medical History, Surgical History, Social History, Family History, Problem List, Medications, and Allergies have been reviewed and updated if relevant.  Patient Active Problem List   Diagnosis Date Noted  . Mastalgia in female 08/15/2018  . Fibrocystic breast changes, left 08/15/2018  . Right ovarian cyst 10/31/2017  . Dysfunctional uterine bleeding 10/13/2017  . Neuralgia 03/07/2017  . Cervical radiculopathy 07/06/2016  . Left sided abdominal pain 03/30/2016  . Cystic acne vulgaris 08/12/2015  . Hyperlipidemia associated with type 2 diabetes mellitus ([place]) 12/13/2014  . Trigeminal nerve disorder 12/06/2014  . Family history of breast cancer-remote 07/27/2013  . Poorly controlled type 2  diabetes mellitus ([place]) 07/06/2013  . History of breast lump/mass excision 09/26/2012  . Breast screening 09/26/2012  . Major depressive disorder, single episode, mild ([place]) 03/02/2012  . High triglycerides 11/18/2010  . Essential hypertension, benign 11/20/2008  . ALLERGIC RHINITIS 03/06/2007    Past Medical History:  Diagnosis Date  . Allergy   . Arthritis    LEFT KNEE  . Diabetes mellitus without complication ([place])   . GERD (gastroesophageal reflux disease)    OCC  . History of kidney stones    H/O  . Hypertension   . MVP (mitral valve prolapse)   . PCOS (polycystic ovarian syndrome)     Past Surgical History:  Procedure Laterality Date  . BREAST BIOPSY Right 02/2012   x2. benign  . cyst removed from ovary    . CYSTOSCOPY  11/01/2017   Procedure: CYSTOSCOPY;  Surgeon: Gae Dry, MD;  Location: [facility] ORS;  Service: Gynecology;;  . Murrell Redden & CURRETTAGE/HYSTROSCOPY WITH NOVASURE ABLATION N/A 12/23/2016   Procedure: DILATATION & CURETTAGE/HYSTEROSCOPY ENDOMETRIAL ABLATION-MINERVA;  Surgeon: Gae Dry, MD;  Location: [facility] ORS;  Service: Gynecology;  Laterality: N/A;  . KNEE SURGERY Left   . LABIOPLASTY N/A 07/12/2017   Procedure: EXCISION OF LABIAL CYST;  Surgeon: Gae Dry, MD;  Location: [facility] ORS;  Service: Gynecology;  Laterality: N/A;  . LAPAROSCOPIC HYSTERECTOMY Bilateral 11/01/2017   Procedure: HYSTERECTOMY TOTAL LAPAROSCOPIC BILATERAL SALPINGECTOMY, RIGHT OVARIAN CYSTECTOMY;  Surgeon: Gae Dry, MD;  Location: [facility] ORS;  Service: Gynecology;  Laterality: Bilateral;  . LAPAROSCOPIC SALPINGO OOPHERECTOMY Left 12/23/2016   Procedure: LAPAROSCOPIC SALPINGO OOPHORECTOMY;  Surgeon: Gae Dry, MD;  Location: [facility] ORS;  Service: Gynecology;  Laterality: Left;  . plantar fasciitis release Bilateral   . TUBAL LIGATION      Social History   Socioeconomic History  . Marital status: Married    Spouse name: Not on file  . Number of children: Not  on file  . Years of education: Not on file  . Highest education level: Not on file  Occupational History  . Not on file  Social Needs  . Financial resource strain: Not on file  . Food insecurity    Worry: Not on file    Inability: Not on file  . Transportation needs    Medical: Not on file    Non-medical: Not on file  Tobacco Use  . Smoking status: Current Some Day Smoker    Packs/day: 0.30    Years: 15.00    Pack years: 4.50    Types: Cigarettes    Last attempt to quit: 12/19/2016    Years since quitting: 2.2  . Smokeless tobacco: Never Used  . Tobacco comment: a few a day  Substance and Sexual Activity  . Alcohol use: No    Alcohol/week: 0.0 standard drinks  . Drug use: No  . Sexual activity: Never  Lifestyle  . Physical activity    Days per week: Not on file    Minutes per session: Not on file  . Stress: Not on file  Relationships  . Social Herbalist on phone: Not on file    Gets together: Not on file    Attends religious service: Not on file    Active member of club or organization: Not on file    Attends meetings of clubs or organizations: Not on file    Relationship status: Not on file  . Intimate partner violence    Fear of current or ex partner: Not on file    Emotionally abused: Not on file    Physically abused: Not on file    Forced sexual activity: Not on file  Other Topics Concern  . Not on file  Social History Narrative  . Not on file    Family History  Problem Relation Age of Onset  . Breast cancer Maternal Aunt        great Aunts   . Breast cancer Paternal Aunt     Allergies  Allergen Reactions  . Penicillins Hives and Itching    Has patient had a PCN reaction causing immediate rash, facial/tongue/throat swelling, SOB or lightheadedness with hypotension: Yes Has patient had a PCN reaction causing severe rash involving mucus membranes or skin necrosis: No Has patient had a PCN reaction that required hospitalization: No Has  patient had a PCN reaction occurring within the last 10 years: No If all of the above answers are "NO", then may proceed with Cephalosporin use.   Marland Kitchen Keflex [Cephalexin] Other (See Comments)    Makes loopy and feels like passing out  . Methyldopa Other (See Comments)    Feels like passing out     Medication list reviewed and updated in full in [place].  ROS: GEN: Acute illness details above GI: Tolerating PO intake GU: maintaining adequate hydration and urination Pulm: No SOB Interactive and getting along well at home.  Otherwise, ROS is as per the HPI.  Objective:   BP (!) 138/96   Pulse  90   Temp 98.2 F (36.8 C) (Temporal)   Ht '5\' 6"'  (1.676 m)   Wt 162 lb 4 oz (73.6 kg)   LMP 10/12/2017   SpO2 97%   BMI 26.19 kg/m   GEN: WDWN, NAD, Non-toxic, A & O x 3 HEENT: Atraumatic, Normocephalic. Neck supple. No masses, No LAD. Ears and Nose: No external deformity. CV: RRR, No M/G/R. No JVD. No thrill. No extra heart sounds. PULM: CTA B, no wheezes, crackles, rhonchi. No retractions. No resp. distress. No accessory muscle use. ABD: S, positive left lower quadrant pain, ND, +BS. No rebound. No HSM. EXTR: No c/c/e NEURO Normal gait.  PSYCH: Normally interactive. Conversant. Not depressed or anxious appearing.  Calm demeanor.     Laboratory and Imaging Data: Results for orders placed or performed in visit on 47/09/29  Basic metabolic panel  Result Value Ref Range   Sodium 137 135 - 145 mEq/L   Potassium 3.7 3.5 - 5.1 mEq/L   Chloride 99 96 - 112 mEq/L   CO2 29 19 - 32 mEq/L   Glucose, Bld 240 (H) 70 - 99 mg/dL   BUN 12 6 - 23 mg/dL   Creatinine, Ser 0.62 0.40 - 1.20 mg/dL   GFR 102.23 >60.00 mL/min   Calcium 9.3 8.4 - 10.5 mg/dL  CBC with Differential  Result Value Ref Range   WBC 8.6 4.0 - 10.5 K/uL   RBC 4.54 3.87 - 5.11 Mil/uL   Hemoglobin 14.1 12.0 - 15.0 g/dL   HCT 41.6 36.0 - 46.0 %   MCV 91.7 78.0 - 100.0 fl   MCHC 33.8 30.0 - 36.0 g/dL   RDW 13.3  11.5 - 15.5 %   Platelets 256.0 150.0 - 400.0 K/uL   Neutrophils Relative % 48.6 43.0 - 77.0 %   Lymphocytes Relative 43.0 12.0 - 46.0 %   Monocytes Relative 5.7 3.0 - 12.0 %   Eosinophils Relative 2.0 0.0 - 5.0 %   Basophils Relative 0.7 0.0 - 3.0 %   Neutro Abs 4.2 1.4 - 7.7 K/uL   Lymphs Abs 3.7 0.7 - 4.0 K/uL   Monocytes Absolute 0.5 0.1 - 1.0 K/uL   Eosinophils Absolute 0.2 0.0 - 0.7 K/uL   Basophils Absolute 0.1 0.0 - 0.1 K/uL  Hepatic function panel  Result Value Ref Range   Total Bilirubin 0.5 0.2 - 1.2 mg/dL   Bilirubin, Direct 0.1 0.0 - 0.3 mg/dL   Alkaline Phosphatase 69 39 - 117 U/L   AST 19 0 - 37 U/L   ALT 29 0 - 35 U/L   Total Protein 6.9 6.0 - 8.3 g/dL   Albumin 4.5 3.5 - 5.2 g/dL  Lipase  Result Value Ref Range   Lipase 73.0 (H) 11.0 - 59.0 U/L  POCT Urinalysis Dipstick (Automated)  Result Value Ref Range   Color, UA Yellow    Clarity, UA Clear    Glucose, UA Positive (A) Negative   Bilirubin, UA Negative    Ketones, UA Trace    Spec Grav, UA >=1.030 (A) 1.010 - 1.025   Blood, UA Negative    pH, UA 5.5 5.0 - 8.0   Protein, UA Positive (A) Negative   Urobilinogen, UA 0.2 0.2 or 1.0 E.U./dL   Nitrite, UA Negative    Leukocytes, UA Negative Negative     Assessment and Plan:     ICD-10-CM   1. Left lower quadrant pain  V74.73 Basic metabolic panel    CBC with Differential  Hepatic function panel    Lipase  2. Flank pain  R10.9 POCT Urinalysis Dipstick (Automated)    Basic metabolic panel    CBC with Differential    Hepatic function panel    Lipase    Urine culture   She has left lower quadrant pain with minimal flank pain on palpation.  She does have some minor pain on the bottom to ribs and intercostal region.  Most commonly this would be diverticulitis in this setting with a 79 year old patient.  She has had a hysterectomy and surgically removed ovaries.   Follow-up: No follow-ups on file.  Meds ordered this encounter  Medications  .  ciprofloxacin (CIPRO) 750 MG tablet    Sig: Take 1 tablet (750 mg total) by mouth 2 (two) times daily for 10 days.    Dispense:  20 tablet    Refill:  0  . metroNIDAZOLE (FLAGYL) 500 MG tablet    Sig: Take 1 tablet (500 mg total) by mouth 3 (three) times daily for 10 days.    Dispense:  30 tablet    Refill:  0   Orders Placed This Encounter  Procedures  . Urine culture  . Basic metabolic panel  . CBC with Differential  . Hepatic function panel  . Lipase  . POCT Urinalysis Dipstick (Automated)    Signed,  Chanoch Mccleery T. Terrilynn Postell, MD   Outpatient Encounter Medications as of 03/28/2019  Medication Sig  . acetaminophen (TYLENOL) 500 MG tablet Take 1,000 mg by mouth 2 (two) times daily as needed for moderate pain.  . Blood Glucose Monitoring Suppl (ONE TOUCH ULTRA SYSTEM KIT) w/Device KIT Use to check blood sugar one to two times daily.  Dx: E11.9  . fenofibrate (TRICOR) 145 MG tablet Take 1 tablet (145 mg total) by mouth daily.  Marland Kitchen glipiZIDE (GLUCOTROL XL) 10 MG 24 hr tablet Take 1 tablet (10 mg total) by mouth daily with breakfast.  . glucose blood (ONE TOUCH ULTRA TEST) test strip Use to check blood sugar one to two times daily.  Dx: E11.9  . hydrocortisone-pramoxine (ANALPRAM-HC) 2.5-1 % rectal cream Place rectally 3 (three) times daily.  . Insulin Pen Needle 31G X 8 MM MISC Use to inject Victoza daily.  Dx: E11.65  . liraglutide (VICTOZA) 18 MG/3ML SOPN INJECT SUBCUTANEOUSLY 1.2MG DAILY  . losartan-hydrochlorothiazide (HYZAAR) 50-12.5 MG tablet TAKE 1 TABLET BY MOUTH EVERY DAY  . metFORMIN (GLUCOPHAGE) 1000 MG tablet TAKE 1 TABLET BY MOUTH TWO TIMES DAILY WITH A MEAL.  . Multiple Vitamin (MULTIVITAMIN WITH MINERALS) TABS tablet Take 2 tablets by mouth daily with lunch.  . nicotine (NICODERM CQ - DOSED IN MG/24 HOURS) 14 mg/24hr patch Place 1 patch (14 mg total) onto the skin daily.  Glory [place] Delica Lancets 62I MISC Use to check blood sugar one to two times daily.  Dx: E11.9  .  pravastatin (PRAVACHOL) 10 MG tablet TAKE 1 TABLET BY MOUTH EVERY DAY  . tamsulosin (FLOMAX) 0.4 MG CAPS capsule Take 1 capsule (0.4 mg total) by mouth daily.  . traMADol (ULTRAM) 50 MG tablet Take 1 tablet (50 mg total) by mouth every 8 (eight) hours as needed for up to 5 days for moderate pain.  . valACYclovir (VALTREX) 1000 MG tablet Take 1 tablet (1,000 mg total) by mouth 2 (two) times daily.  Marland Kitchen venlafaxine XR (EFFEXOR-XR) 37.5 MG 24 hr capsule TAKE 1 CAPSULE BY MOUTH DAILY WITH LUNCH  . atorvastatin (LIPITOR) 40 MG tablet Take 1 tablet (40 mg total) by  mouth daily. (Patient not taking: Reported on 12/01/2018)  . ciprofloxacin (CIPRO) 750 MG tablet Take 1 tablet (750 mg total) by mouth 2 (two) times daily for 10 days.  . metroNIDAZOLE (FLAGYL) 500 MG tablet Take 1 tablet (500 mg total) by mouth 3 (three) times daily for 10 days.   No facility-administered encounter medications on file as of 03/28/2019.

## 2019-03-28 NOTE — Telephone Encounter (Signed)
Patient had virtual visit on 11/30 for kidney stones and was prescribed a medication. She stated that her symptoms are not improving and she asked if she could come by to have a urine test done to make sure that it was not a kidney infection    Patient is requesting a call back

## 2019-03-28 NOTE — Telephone Encounter (Signed)
yes

## 2019-03-28 NOTE — Telephone Encounter (Signed)
Appointment scheduled today with Dr. Lorelei Pont at 11:20 am.

## 2019-03-29 ENCOUNTER — Encounter: Payer: Self-pay | Admitting: Family Medicine

## 2019-03-30 LAB — URINE CULTURE
MICRO NUMBER:: 1155633
Result:: NO GROWTH
SPECIMEN QUALITY:: ADEQUATE

## 2019-04-02 ENCOUNTER — Encounter: Payer: Self-pay | Admitting: Family Medicine

## 2019-04-05 ENCOUNTER — Ambulatory Visit: Payer: 59 | Admitting: Family Medicine

## 2019-04-12 ENCOUNTER — Other Ambulatory Visit: Payer: Self-pay | Admitting: *Deleted

## 2019-04-12 ENCOUNTER — Other Ambulatory Visit: Payer: Self-pay | Admitting: Family Medicine

## 2019-04-12 MED ORDER — VICTOZA 18 MG/3ML ~~LOC~~ SOPN: PEN_INJECTOR | SUBCUTANEOUS | 0 refills | Status: DC

## 2019-04-17 ENCOUNTER — Other Ambulatory Visit: Payer: Self-pay | Admitting: Family Medicine

## 2019-05-02 ENCOUNTER — Ambulatory Visit
Admission: RE | Admit: 2019-05-02 | Discharge: 2019-05-02 | Disposition: A | Discharge status: Home / Self Care | Payer: 59 | Source: Ambulatory Visit | Attending: Family Medicine | Admitting: Family Medicine

## 2019-05-02 DIAGNOSIS — Z1231 Encounter for screening mammogram for malignant neoplasm of breast: Secondary | ICD-10-CM | POA: Insufficient documentation

## 2019-05-03 NOTE — Progress Notes (Signed)
No critical labs need to be addressed urgently. We will discuss labs in detail at upcoming office visit.   

## 2019-05-08 ENCOUNTER — Other Ambulatory Visit (INDEPENDENT_AMBULATORY_CARE_PROVIDER_SITE_OTHER): Payer: 59

## 2019-05-08 ENCOUNTER — Other Ambulatory Visit: Payer: Self-pay

## 2019-05-08 DIAGNOSIS — E1165 Type 2 diabetes mellitus with hyperglycemia: Secondary | ICD-10-CM

## 2019-05-08 LAB — COMPREHENSIVE METABOLIC PANEL
ALT: 27 U/L (ref 0–35)
AST: 20 U/L (ref 0–37)
Albumin: 4.3 g/dL (ref 3.5–5.2)
Alkaline Phosphatase: 63 U/L (ref 39–117)
BUN: 14 mg/dL (ref 6–23)
CO2: 29 mEq/L (ref 19–32)
Calcium: 9.4 mg/dL (ref 8.4–10.5)
Chloride: 98 mEq/L (ref 96–112)
Creatinine, Ser: 0.73 mg/dL (ref 0.40–1.20)
GFR: 84.63 mL/min (ref >60.00)
Glucose, Bld: 262 mg/dL — ABNORMAL HIGH (ref 70–99)
Potassium: 4.1 mEq/L (ref 3.5–5.1)
Sodium: 137 mEq/L (ref 135–145)
Total Bilirubin: 0.6 mg/dL (ref 0.2–1.2)
Total Protein: 7 g/dL (ref 6.0–8.3)

## 2019-05-08 LAB — LIPID PANEL
Cholesterol: 369 mg/dL — ABNORMAL HIGH (ref 0–200)
HDL: 41.1 mg/dL (ref >39.00)
Total CHOL/HDL Ratio: 9
Triglycerides: 1472 mg/dL — ABNORMAL HIGH (ref 0.0–149.0)

## 2019-05-08 LAB — LDL CHOLESTEROL, DIRECT: Direct LDL: 95 mg/dL

## 2019-05-08 LAB — HEMOGLOBIN A1C: Hgb A1c MFr Bld: 9.5 % — ABNORMAL HIGH (ref 4.6–6.5)

## 2019-05-08 NOTE — Progress Notes (Signed)
No critical labs need to be addressed urgently. We will discuss labs in detail at upcoming office visit.   

## 2019-05-11 ENCOUNTER — Ambulatory Visit: Payer: 59 | Admitting: Family Medicine

## 2019-05-11 ENCOUNTER — Other Ambulatory Visit: Payer: Self-pay

## 2019-05-11 ENCOUNTER — Encounter: Payer: Self-pay | Admitting: Family Medicine

## 2019-05-11 VITALS — BP 110/80 | HR 106 | Temp 97.9°F | Ht 66.0 in | Wt 163.0 lb

## 2019-05-11 DIAGNOSIS — E1165 Type 2 diabetes mellitus with hyperglycemia: Secondary | ICD-10-CM | POA: Diagnosis not present

## 2019-05-11 DIAGNOSIS — E1169 Type 2 diabetes mellitus with other specified complication: Secondary | ICD-10-CM | POA: Diagnosis not present

## 2019-05-11 DIAGNOSIS — E1159 Type 2 diabetes mellitus with other circulatory complications: Secondary | ICD-10-CM | POA: Diagnosis not present

## 2019-05-11 DIAGNOSIS — I10 Essential (primary) hypertension: Secondary | ICD-10-CM

## 2019-05-11 DIAGNOSIS — I152 Hypertension secondary to endocrine disorders: Secondary | ICD-10-CM

## 2019-05-11 DIAGNOSIS — Z23 Encounter for immunization: Secondary | ICD-10-CM

## 2019-05-11 DIAGNOSIS — E785 Hyperlipidemia, unspecified: Secondary | ICD-10-CM

## 2019-05-11 DIAGNOSIS — E1140 Type 2 diabetes mellitus with diabetic neuropathy, unspecified: Secondary | ICD-10-CM

## 2019-05-11 MED ORDER — CANAGLIFLOZIN 100 MG PO TABS: 100.0000 mg | ORAL_TABLET | Freq: Every day | ORAL | 11 refills | Status: DC

## 2019-05-11 NOTE — Patient Instructions (Addendum)
Increase victoza to 1.8 mg  at night.  Stop glipizide. If fasting blood sugars not < 120.. start invokana in mornings.  Increase activity , decrease carbs in diet.   Start fish oil  (1000 mg  twice daily)  Send MyChart message with blood sugar in next few weeks.

## 2019-05-11 NOTE — Progress Notes (Signed)
Chief Complaint  Patient presents with  . Diabetes    History of Present Illness: HPI  49 year old female presents for follow up DM.  Diabetes:   On glucotrol max and vicotza 1.2 mg daily, on Metformin max tolerate dose.  Lab Results  Component Value Date   HGBA1C 9.5 (H) 05/08/2019  Using medications without difficulties: Hypoglycemic episodes: Hyperglycemic episodes: Feet problems: burning in feet in last few weeks. Blood Sugars averaging: eye exam within last year:   Wt Readings from Last 3 Encounters:  05/11/19 163 lb (73.9 kg)  03/28/19 162 lb 4 oz (73.6 kg)  12/01/18 161 lb 12 oz (73.4 kg)      Hypertension:   At goal on losartan HCTZ BP Readings from Last 3 Encounters:  05/11/19 110/80  03/28/19 (!) 138/96  12/01/18 118/80  Using medication without problems or lightheadedness:  Chest pain with exertion: Edema: Short of breath: Average home BPs: Other issues:  Elevated Cholesterol:   She is not taking tricor and atorvastatin .. makes her feel very tired. Lab Results  Component Value Date   CHOL 369 (H) 05/08/2019   HDL 41.10 05/08/2019   LDLCALC 26 06/29/2013   LDLDIRECT 95.0 05/08/2019   TRIG (H) 05/08/2019    1472.0 Triglyceride is over 400; calculations on Lipids are invalid.   CHOLHDL 9 05/08/2019    Using medications without problems: Muscle aches:  Diet compliance:poor Exercise: minimal Other complaints:    This visit occurred during the SARS-CoV-2 public health emergency.  Safety protocols were in place, including screening questions prior to the visit, additional usage of staff PPE, and extensive cleaning of exam room while observing appropriate contact time as indicated for disinfecting solutions.   COVID 19 screen:  No recent travel or known exposure to COVID19 The patient denies respiratory symptoms of COVID 19 at this time. The importance of social distancing was discussed today.     Review of Systems  Constitutional: Negative  for chills and fever.  HENT: Negative for congestion and ear pain.   Eyes: Negative for pain and redness.  Respiratory: Negative for cough and shortness of breath.   Cardiovascular: Negative for chest pain, palpitations and leg swelling.  Gastrointestinal: Negative for abdominal pain, blood in stool, constipation, diarrhea, nausea and vomiting.  Genitourinary: Negative for dysuria.  Musculoskeletal: Negative for falls and myalgias.  Skin: Negative for rash.  Neurological: Negative for dizziness.  Psychiatric/Behavioral: Negative for depression. The patient is not nervous/anxious.       Past Medical History:  Diagnosis Date  . Allergy   . Arthritis    LEFT KNEE  . Diabetes mellitus without complication ([place])   . GERD (gastroesophageal reflux disease)    OCC  . History of kidney stones    H/O  . Hypertension   . MVP (mitral valve prolapse)   . PCOS (polycystic ovarian syndrome)     reports that she has been smoking cigarettes. She has a 4.50 pack-year smoking history. She has never used smokeless tobacco. She reports that she does not drink alcohol or use drugs.   Current Outpatient Medications:  .  acetaminophen (TYLENOL) 500 MG tablet, Take 1,000 mg by mouth 2 (two) times daily as needed for moderate pain., Disp: , Rfl:  .  Blood Glucose Monitoring Suppl (ONE TOUCH ULTRA SYSTEM KIT) w/Device KIT, Use to check blood sugar one to two times daily.  Dx: E11.9, Disp: 1 each, Rfl: 0 .  fenofibrate (TRICOR) 145 MG tablet, Take 1 tablet (145  mg total) by mouth daily., Disp: 30 tablet, Rfl: 11 .  glipiZIDE (GLUCOTROL XL) 10 MG 24 hr tablet, Take 1 tablet (10 mg total) by mouth daily with breakfast., Disp: 90 tablet, Rfl: 1 .  glucose blood (ONE TOUCH ULTRA TEST) test strip, Use to check blood sugar one to two times daily.  Dx: E11.9, Disp: 200 each, Rfl: 3 .  hydrocortisone-pramoxine (ANALPRAM-HC) 2.5-1 % rectal cream, Place rectally 3 (three) times daily., Disp: 30 g, Rfl: 0 .  Insulin  Pen Needle 31G X 8 MM MISC, Use to inject Victoza daily.  Dx: E11.65, Disp: 90 each, Rfl: 3 .  liraglutide (VICTOZA) 18 MG/3ML SOPN, INJECT SUBCUTANEOUSLY 1.2MG DAILY, Disp: 18 mL, Rfl: 0 .  losartan-hydrochlorothiazide (HYZAAR) 50-12.5 MG tablet, TAKE 1 TABLET BY MOUTH EVERY DAY, Disp: 30 tablet, Rfl: 0 .  metFORMIN (GLUCOPHAGE) 1000 MG tablet, TAKE 1 TABLET BY MOUTH TWO TIMES DAILY WITH A MEAL., Disp: 180 tablet, Rfl: 1 .  Multiple Vitamin (MULTIVITAMIN WITH MINERALS) TABS tablet, Take 2 tablets by mouth daily with lunch., Disp: , Rfl:  .  nicotine (NICODERM CQ - DOSED IN MG/24 HOURS) 14 mg/24hr patch, Place 1 patch (14 mg total) onto the skin daily., Disp: 28 patch, Rfl: 0 .  OneTouch Delica Lancets 65Y MISC, Use to check blood sugar one to two times daily.  Dx: E11.9, Disp: 200 each, Rfl: 3 .  pravastatin (PRAVACHOL) 10 MG tablet, TAKE 1 TABLET BY MOUTH EVERY DAY, Disp: 90 tablet, Rfl: 3 .  tamsulosin (FLOMAX) 0.4 MG CAPS capsule, Take 1 capsule (0.4 mg total) by mouth daily., Disp: 30 capsule, Rfl: 0 .  valACYclovir (VALTREX) 1000 MG tablet, Take 1 tablet (1,000 mg total) by mouth 2 (two) times daily., Disp: 14 tablet, Rfl: 1 .  venlafaxine XR (EFFEXOR-XR) 37.5 MG 24 hr capsule, TAKE 1 CAPSULE BY MOUTH DAILY WITH LUNCH, Disp: 90 capsule, Rfl: 1 .  atorvastatin (LIPITOR) 40 MG tablet, Take 1 tablet (40 mg total) by mouth daily. (Patient not taking: Reported on 12/01/2018), Disp: 90 tablet, Rfl: 3   Observations/Objective: Blood pressure 110/80, pulse (!) 106, temperature 97.9 F (36.6 C), temperature source Temporal, height '5\' 6"'  (1.676 m), weight 163 lb (73.9 kg), last menstrual period 10/12/2017, SpO2 97 %.  Physical Exam Constitutional:      General: She is not in acute distress.    Appearance: Normal appearance. She is well-developed. She is not ill-appearing or toxic-appearing.  HENT:     Head: Normocephalic.     Right Ear: Hearing, tympanic membrane, ear canal and external ear normal.  Tympanic membrane is not erythematous, retracted or bulging.     Left Ear: Hearing, tympanic membrane, ear canal and external ear normal. Tympanic membrane is not erythematous, retracted or bulging.     Nose: No mucosal edema or rhinorrhea.     Right Sinus: No maxillary sinus tenderness or frontal sinus tenderness.     Left Sinus: No maxillary sinus tenderness or frontal sinus tenderness.     Mouth/Throat:     Pharynx: Uvula midline.  Eyes:     General: Lids are normal. Lids are everted, no foreign bodies appreciated.     Conjunctiva/sclera: Conjunctivae normal.     Pupils: Pupils are equal, round, and reactive to light.  Neck:     Thyroid: No thyroid mass or thyromegaly.     Vascular: No carotid bruit.     Trachea: Trachea normal.  Cardiovascular:     Rate and Rhythm: Normal rate and  regular rhythm.     Pulses: Normal pulses.     Heart sounds: Normal heart sounds, S1 normal and S2 normal. No murmur. No friction rub. No gallop.   Pulmonary:     Effort: Pulmonary effort is normal. No tachypnea or respiratory distress.     Breath sounds: Normal breath sounds. No decreased breath sounds, wheezing, rhonchi or rales.  Abdominal:     General: Bowel sounds are normal.     Palpations: Abdomen is soft.     Tenderness: There is no abdominal tenderness.  Musculoskeletal:     Cervical back: Normal range of motion and neck supple.  Skin:    General: Skin is warm and dry.     Findings: No rash.  Neurological:     Mental Status: She is alert.  Psychiatric:        Mood and Affect: Mood is not anxious or depressed.        Speech: Speech normal.        Behavior: Behavior normal. Behavior is cooperative.        Thought Content: Thought content normal.        Judgment: Judgment normal.      Assessment and Plan Poorly controlled type 2 diabetes mellitus Increase victoza to 1.8 mg  at night.  Stop glipizide. If fasting blood sugars not < 120.. start invokana in mornings.  Increase activity ,  decrease carbs in diet.  Hyperlipidemia associated with type 2 diabetes mellitus Low fat low carb diet.  Restart omeg3 fatty acids.  SE to tricor and atorvastatin  Hypertension associated with diabetes ([place]) Well controlled. Continue current medication.       Eliezer Lofts, MD

## 2019-05-14 ENCOUNTER — Telehealth: Payer: Self-pay | Admitting: *Deleted

## 2019-05-14 NOTE — Telephone Encounter (Signed)
Received fax from [facility] requesting PA for Invokana 100 mg.  PA completed on CoverMyMeds and sent to [employer] for review.  Can take up to 3 business days for a decision.

## 2019-05-14 NOTE — Telephone Encounter (Addendum)
PA denied.  Patient must have failed a three month trial of Jardiance.  [facility] notified of denial via fax.

## 2019-05-15 ENCOUNTER — Other Ambulatory Visit: Payer: Self-pay | Admitting: Family Medicine

## 2019-05-15 MED ORDER — INSULIN PEN NEEDLE 31G X 8 MM MISC: 3 refills | Status: DC

## 2019-05-15 MED ORDER — EMPAGLIFLOZIN 10 MG PO TABS: 10.0000 mg | ORAL_TABLET | Freq: Every day | ORAL | 11 refills | Status: DC

## 2019-05-15 NOTE — Addendum Note (Signed)
Addended by: Carter Kitten on: 05/15/2019 02:41 PM   Modules accepted: Orders

## 2019-05-15 NOTE — Telephone Encounter (Signed)
[facility] notified by telphone of change in medication.

## 2019-05-15 NOTE — Telephone Encounter (Signed)
Sent in [place]. D/C'd invokana

## 2019-05-15 NOTE — Telephone Encounter (Signed)
Donta from CoverMyMeds called stating that the PA for Invokana was denied because of lack of clinical information. Harlan Stains stated that they have started a new PA in case Dr. Diona Browner wanted to pursue the PA. Harlan Stains stated that the ref number is [id number] to pursue the PA. Harlan Stains stated that they would need clinical notes and more information.

## 2019-05-15 NOTE — Progress Notes (Signed)
Let pt know.. send in jardiance which insurance will cover in place of invokana.

## 2019-05-16 NOTE — Telephone Encounter (Signed)
Medication has already been changed to [place] which insurance states patient must try and fail 3 months of before Invokana would be approved.

## 2019-05-22 ENCOUNTER — Other Ambulatory Visit: Payer: Self-pay | Admitting: Family Medicine

## 2019-05-31 ENCOUNTER — Emergency Department: Payer: 59

## 2019-05-31 ENCOUNTER — Other Ambulatory Visit: Payer: Self-pay

## 2019-05-31 ENCOUNTER — Encounter: Payer: Self-pay | Admitting: Emergency Medicine

## 2019-05-31 ENCOUNTER — Emergency Department
Admission: EM | Admit: 2019-05-31 | Discharge: 2019-05-31 | Disposition: A | Discharge status: Home / Self Care | Payer: 59 | Attending: Emergency Medicine | Admitting: Emergency Medicine

## 2019-05-31 DIAGNOSIS — R0789 Other chest pain: Secondary | ICD-10-CM | POA: Diagnosis not present

## 2019-05-31 DIAGNOSIS — Z7984 Long term (current) use of oral hypoglycemic drugs: Secondary | ICD-10-CM | POA: Diagnosis not present

## 2019-05-31 DIAGNOSIS — E119 Type 2 diabetes mellitus without complications: Secondary | ICD-10-CM | POA: Diagnosis not present

## 2019-05-31 DIAGNOSIS — Z79899 Other long term (current) drug therapy: Secondary | ICD-10-CM | POA: Diagnosis not present

## 2019-05-31 DIAGNOSIS — R079 Chest pain, unspecified: Secondary | ICD-10-CM

## 2019-05-31 DIAGNOSIS — I10 Essential (primary) hypertension: Secondary | ICD-10-CM | POA: Diagnosis not present

## 2019-05-31 DIAGNOSIS — R519 Headache, unspecified: Secondary | ICD-10-CM | POA: Insufficient documentation

## 2019-05-31 DIAGNOSIS — M546 Pain in thoracic spine: Secondary | ICD-10-CM | POA: Diagnosis not present

## 2019-05-31 DIAGNOSIS — F17210 Nicotine dependence, cigarettes, uncomplicated: Secondary | ICD-10-CM | POA: Insufficient documentation

## 2019-05-31 DIAGNOSIS — R110 Nausea: Secondary | ICD-10-CM | POA: Diagnosis not present

## 2019-05-31 LAB — BASIC METABOLIC PANEL
Anion gap: 14 (ref 5–15)
BUN: 16 mg/dL (ref 6–20)
CO2: 27 mmol/L (ref 22–32)
Calcium: 10.2 mg/dL (ref 8.9–10.3)
Chloride: 98 mmol/L (ref 98–111)
Creatinine, Ser: 0.77 mg/dL (ref 0.44–1.00)
GFR calc Af Amer: >60 mL/min (ref >60)
GFR calc non Af Amer: >60 mL/min (ref >60)
Glucose, Bld: 148 mg/dL — ABNORMAL HIGH (ref 70–99)
Potassium: 3.6 mmol/L (ref 3.5–5.1)
Sodium: 139 mmol/L (ref 135–145)

## 2019-05-31 LAB — CBC
HCT: 45.4 % (ref 36.0–46.0)
Hemoglobin: 15 g/dL (ref 12.0–15.0)
MCH: 30.3 pg (ref 26.0–34.0)
MCHC: 33 g/dL (ref 30.0–36.0)
MCV: 91.7 fL (ref 80.0–100.0)
Platelets: 293 10*3/uL (ref 150–400)
RBC: 4.95 MIL/uL (ref 3.87–5.11)
RDW: 12.7 % (ref 11.5–15.5)
WBC: 9 10*3/uL (ref 4.0–10.5)
nRBC: 0 % (ref 0.0–0.2)

## 2019-05-31 LAB — TROPONIN I (HIGH SENSITIVITY)
Troponin I (High Sensitivity): 4 ng/L (ref <18)
Troponin I (High Sensitivity): 4 ng/L (ref <18)

## 2019-05-31 MED ORDER — ALUM & MAG HYDROXIDE-SIMETH 200-200-20 MG/5ML PO SUSP
15.0000 mL | Freq: Once | ORAL | Status: AC
  Administered 2019-05-31: 15 mL via ORAL
  Filled 2019-05-31: qty 30

## 2019-05-31 MED ORDER — ACETAMINOPHEN 325 MG PO TABS
650.0000 mg | ORAL_TABLET | Freq: Once | ORAL | Status: AC
  Administered 2019-05-31: 650 mg via ORAL
  Filled 2019-05-31: qty 2

## 2019-05-31 MED ORDER — ONDANSETRON 4 MG PO TBDP
4.0000 mg | ORAL_TABLET | Freq: Once | ORAL | Status: AC
  Administered 2019-05-31: 4 mg via ORAL
  Filled 2019-05-31: qty 1

## 2019-05-31 MED ORDER — LIDOCAINE VISCOUS HCL 2 % MT SOLN
15.0000 mL | Freq: Once | OROMUCOSAL | Status: AC
  Administered 2019-05-31: 15 mL via ORAL
  Filled 2019-05-31: qty 15

## 2019-05-31 MED ORDER — ONDANSETRON 4 MG PO TBDP: 4.0000 mg | ORAL_TABLET | Freq: Three times a day (TID) | ORAL | 0 refills | Status: DC | PRN

## 2019-05-31 NOTE — ED Triage Notes (Signed)
Pt reports yesterday started with some upper back pain and today started with some left sided chest pain, heavy in nature along with a HA and nausea.

## 2019-05-31 NOTE — ED Notes (Signed)
Sunquest not working. Lab contacted d/t troponin being sent with white label

## 2019-05-31 NOTE — ED Provider Notes (Signed)
[facility] Emergency Department Provider Note   ____________________________________________   First MD Initiated Contact with Patient 05/31/19 1216     (approximate)  I have reviewed the triage vital signs and the nursing notes.   HISTORY  Chief Complaint Chest Pain, Back Pain, and Nausea    HPI Alyssa Warren is a 49 y.o. female with history of hypertension and diabetes who presents to the ED complaining of chest pain.  Patient reports that she woke up this morning with some heavy pain in the left side of her chest that seems to move into her mid back.  This been associated with some nausea, but she denies any vomiting or diarrhea.  She has not had any fevers, cough, or shortness of breath.  She does state that she has been experiencing occasional pain underneath her left breast for the past couple of weeks as well as some discomfort when she goes to take her medications.  She saw her PCP for this problem and had a mammogram that was negative.  She denies any numbness or weakness in her extremities, does endorse a headache.        Past Medical History:  Diagnosis Date  . Allergy   . Arthritis    LEFT KNEE  . Diabetes mellitus without complication ([place])   . GERD (gastroesophageal reflux disease)    OCC  . History of kidney stones    H/O  . Hypertension   . MVP (mitral valve prolapse)   . PCOS (polycystic ovarian syndrome)     Patient Active Problem List   Diagnosis Date Noted  . Mastalgia in female 08/15/2018  . Fibrocystic breast changes, left 08/15/2018  . Right ovarian cyst 10/31/2017  . Dysfunctional uterine bleeding 10/13/2017  . Neuralgia 03/07/2017  . Cervical radiculopathy 07/06/2016  . Left sided abdominal pain 03/30/2016  . Cystic acne vulgaris 08/12/2015  . Hyperlipidemia associated with type 2 diabetes mellitus ([place]) 12/13/2014  . Trigeminal nerve disorder 12/06/2014  . Family history of breast cancer-remote 07/27/2013   . Poorly controlled type 2 diabetes mellitus ([place]) 07/06/2013  . History of breast lump/mass excision 09/26/2012  . Breast screening 09/26/2012  . Major depressive disorder, single episode, mild ([place]) 03/02/2012  . High triglycerides 11/18/2010  . Essential hypertension, benign 11/20/2008  . ALLERGIC RHINITIS 03/06/2007    Past Surgical History:  Procedure Laterality Date  . BREAST BIOPSY Right 02/2012   x2. benign  . cyst removed from ovary    . CYSTOSCOPY  11/01/2017   Procedure: CYSTOSCOPY;  Surgeon: Gae Dry, MD;  Location: [facility] ORS;  Service: Gynecology;;  . Murrell Redden & CURRETTAGE/HYSTROSCOPY WITH NOVASURE ABLATION N/A 12/23/2016   Procedure: DILATATION & CURETTAGE/HYSTEROSCOPY ENDOMETRIAL ABLATION-MINERVA;  Surgeon: Gae Dry, MD;  Location: [facility] ORS;  Service: Gynecology;  Laterality: N/A;  . KNEE SURGERY Left   . LABIOPLASTY N/A 07/12/2017   Procedure: EXCISION OF LABIAL CYST;  Surgeon: Gae Dry, MD;  Location: [facility] ORS;  Service: Gynecology;  Laterality: N/A;  . LAPAROSCOPIC HYSTERECTOMY Bilateral 11/01/2017   Procedure: HYSTERECTOMY TOTAL LAPAROSCOPIC BILATERAL SALPINGECTOMY, RIGHT OVARIAN CYSTECTOMY;  Surgeon: Gae Dry, MD;  Location: [facility] ORS;  Service: Gynecology;  Laterality: Bilateral;  . LAPAROSCOPIC SALPINGO OOPHERECTOMY Left 12/23/2016   Procedure: LAPAROSCOPIC SALPINGO OOPHORECTOMY;  Surgeon: Gae Dry, MD;  Location: [facility] ORS;  Service: Gynecology;  Laterality: Left;  . plantar fasciitis release Bilateral   . TUBAL LIGATION      Prior to Admission medications  Medication Sig Start Date End Date Taking? Authorizing Provider  acetaminophen (TYLENOL) 500 MG tablet Take 1,000 mg by mouth 2 (two) times daily as needed for moderate pain.    [provider]  atorvastatin (LIPITOR) 40 MG tablet Take 1 tablet (40 mg total) by mouth daily. Patient not taking: Reported on 12/01/2018 02/24/18   Jinny Sanders, MD  Blood Glucose  Monitoring Suppl (ONE TOUCH ULTRA SYSTEM KIT) w/Device KIT Use to check blood sugar one to two times daily.  Dx: E11.9 08/13/16   Jinny Sanders, MD  empagliflozin (JARDIANCE) 10 MG TABS tablet Take 10 mg by mouth daily before breakfast. 05/15/19   Bedsole, Amy E, MD  fenofibrate (TRICOR) 145 MG tablet Take 1 tablet (145 mg total) by mouth daily. 11/29/18   Bedsole, Amy E, MD  glucose blood (ONE TOUCH ULTRA TEST) test strip Use to check blood sugar one to two times daily.  Dx: E11.9 08/07/18   Jinny Sanders, MD  hydrocortisone-pramoxine [facility]) 2.5-1 % rectal cream Place rectally 3 (three) times daily. 12/08/18   Bedsole, Amy E, MD  Insulin Pen Needle 31G X 8 MM MISC Use to inject Victoza daily.  Dx: E11.65 05/15/19   Bedsole, Amy E, MD  liraglutide (VICTOZA) 18 MG/3ML SOPN INJECT SUBCUTANEOUSLY 1.2MG DAILY 04/12/19   Bedsole, Amy E, MD  losartan-hydrochlorothiazide (HYZAAR) 50-12.5 MG tablet TAKE 1 TABLET BY MOUTH EVERY DAY 03/19/19   Bedsole, Amy E, MD  metFORMIN (GLUCOPHAGE) 1000 MG tablet TAKE 1 TABLET BY MOUTH TWO TIMES DAILY WITH A MEAL. 02/26/19   Jinny Sanders, MD  Multiple Vitamin (MULTIVITAMIN WITH MINERALS) TABS tablet Take 2 tablets by mouth daily with lunch.    [provider]  nicotine (NICODERM CQ - DOSED IN MG/24 HOURS) 14 mg/24hr patch Place 1 patch (14 mg total) onto the skin daily. 02/24/18   Bedsole, Amy E, MD  ondansetron (ZOFRAN ODT) 4 MG disintegrating tablet Take 1 tablet (4 mg total) by mouth every 8 (eight) hours as needed for nausea or vomiting. 05/31/19   Blake Divine, MD  OneTouch Delica Lancets 44Y MISC Use to check blood sugar one to two times daily.  Dx: E11.9 08/07/18   Bedsole, Amy E, MD  pravastatin (PRAVACHOL) 10 MG tablet TAKE 1 TABLET BY MOUTH EVERY DAY 02/26/19   Bedsole, Amy E, MD  tamsulosin (FLOMAX) 0.4 MG CAPS capsule Take 1 capsule (0.4 mg total) by mouth daily. 03/26/19   Copland, Frederico Hamman, MD  valACYclovir (VALTREX) 1000 MG tablet Take 1 tablet  (1,000 mg total) by mouth 2 (two) times daily. 02/24/18   Bedsole, Amy E, MD  venlafaxine XR (EFFEXOR-XR) 37.5 MG 24 hr capsule TAKE 1 CAPSULE BY MOUTH DAILY WITH LUNCH 02/23/19   Bedsole, Amy E, MD    Allergies Penicillins, Keflex [cephalexin], and Methyldopa  Family History  Problem Relation Age of Onset  . Breast cancer Maternal Aunt        great Aunts   . Breast cancer Paternal Aunt     Social History Social History   Tobacco Use  . Smoking status: Current Some Day Smoker    Packs/day: 0.30    Years: 15.00    Pack years: 4.50    Types: Cigarettes    Last attempt to quit: 12/19/2016    Years since quitting: 2.4  . Smokeless tobacco: Never Used  . Tobacco comment: a few a day  Substance Use Topics  . Alcohol use: No    Alcohol/week: 0.0 standard  drinks  . Drug use: No    Review of Systems  Constitutional: No fever/chills Eyes: No visual changes. ENT: No sore throat. Cardiovascular: Positive for chest pain. Respiratory: Denies shortness of breath. Gastrointestinal: No abdominal pain.  No nausea, no vomiting.  No diarrhea.  No constipation. Genitourinary: Negative for dysuria. Musculoskeletal: Positive for for back pain. Skin: Negative for rash. Neurological: Negative for headaches, focal weakness or numbness.  ____________________________________________   PHYSICAL EXAM:  VITAL SIGNS: ED Triage Vitals  Enc Vitals Group     BP 05/31/19 1015 (!) 151/97     Pulse Rate 05/31/19 1015 (!) 105     Resp 05/31/19 1015 17     Temp 05/31/19 1016 98.9 F (37.2 C)     Temp Source 05/31/19 1016 Oral     SpO2 05/31/19 1015 98 %     Weight 05/31/19 1004 162 lb (73.5 kg)     Height 05/31/19 1004 '5\' 6"'  (1.676 m)     Head Circumference --      Peak Flow --      Pain Score 05/31/19 1004 7     Pain Loc --      Pain Edu? --      Excl. in [place]? --     Constitutional: Alert and oriented. Eyes: Conjunctivae are normal. Head: Atraumatic. Nose: No  congestion/rhinnorhea. Mouth/Throat: Mucous membranes are moist. Neck: Normal ROM Cardiovascular: Normal rate, regular rhythm. Grossly normal heart sounds.  2+ radial pulses bilaterally. Respiratory: Normal respiratory effort.  No retractions. Lungs CTAB.  No chest wall tenderness. Gastrointestinal: Soft and nontender. No distention. Genitourinary: deferred Musculoskeletal: No lower extremity tenderness nor edema. Neurologic:  Normal speech and language. No gross focal neurologic deficits are appreciated. Skin:  Skin is warm, dry and intact. No rash noted. Psychiatric: Mood and affect are normal. Speech and behavior are normal.  ____________________________________________   LABS (all labs ordered are listed, but only abnormal results are displayed)  Labs Reviewed  BASIC METABOLIC PANEL - Abnormal; Notable for the following components:      Result Value   Glucose, Bld 148 (*)    All other components within normal limits  CBC  TROPONIN I (HIGH SENSITIVITY)  TROPONIN I (HIGH SENSITIVITY)   ____________________________________________  EKG  ED ECG REPORT I, Blake Divine, the attending physician, personally viewed and interpreted this ECG.   Date: 05/31/2019  EKG Time: 10:09  Rate: 99  Rhythm: normal EKG, normal sinus rhythm, unchanged from previous tracings  Axis: Normal  Intervals:none  ST&T Change: None   PROCEDURES  Procedure(s) performed (including Critical Care):  Procedures   ____________________________________________   INITIAL IMPRESSION / ASSESSMENT AND PLAN / ED COURSE       49 year old female with history of hypertension and diabetes presents to the ED with heaviness in her left chest moving into her back since waking up this morning.  She is resting comfortably at this time with minimal pain, pulses are intact in her extremities and I doubt dissection.  Symptoms also do not appear consistent with PE.  EKG shows no acute ischemic changes, initial  troponin negative.  She has a heart score of less than 4 and if repeat troponin within normal limits, doubt ACS.  It is possible that she has some undiagnosed reflux given discomfort with taking medications, will trial GI cocktail.  Repeat troponin within normal limits, doubt ACS.  Patient's chest pain has resolved following GI cocktail, but she is complaining of a headache as well as some nausea.  We will treat symptomatically with Tylenol and Zofran, she is otherwise appropriate for discharge home and PCP follow-up.  Patient agrees with plan.      ____________________________________________   FINAL CLINICAL IMPRESSION(S) / ED DIAGNOSES  Final diagnoses:  Chest pain, unspecified type  Acute left-sided thoracic back pain  Acute nonintractable headache, unspecified headache type     ED Discharge Orders         Ordered    ondansetron (ZOFRAN ODT) 4 MG disintegrating tablet  Every 8 hours PRN     05/31/19 1523           Note:  This document was prepared using Dragon voice recognition software and may include unintentional dictation errors.   Blake Divine, MD 05/31/19 2007

## 2019-06-01 ENCOUNTER — Ambulatory Visit: Payer: 59 | Admitting: Family Medicine

## 2019-06-01 ENCOUNTER — Encounter: Payer: Self-pay | Admitting: Family Medicine

## 2019-06-01 VITALS — BP 122/82 | HR 105 | Temp 98.0°F | Ht 66.0 in | Wt 156.0 lb

## 2019-06-01 DIAGNOSIS — E781 Pure hyperglyceridemia: Secondary | ICD-10-CM

## 2019-06-01 DIAGNOSIS — R1013 Epigastric pain: Secondary | ICD-10-CM | POA: Diagnosis not present

## 2019-06-01 DIAGNOSIS — Z8249 Family history of ischemic heart disease and other diseases of the circulatory system: Secondary | ICD-10-CM | POA: Diagnosis not present

## 2019-06-01 DIAGNOSIS — R0789 Other chest pain: Secondary | ICD-10-CM

## 2019-06-01 DIAGNOSIS — E1165 Type 2 diabetes mellitus with hyperglycemia: Secondary | ICD-10-CM | POA: Diagnosis not present

## 2019-06-01 LAB — LIPASE: Lipase: 55 U/L (ref 11.0–59.0)

## 2019-06-01 LAB — HEPATIC FUNCTION PANEL
ALT: 27 U/L (ref 0–35)
AST: 18 U/L (ref 0–37)
Albumin: 4.9 g/dL (ref 3.5–5.2)
Alkaline Phosphatase: 69 U/L (ref 39–117)
Bilirubin, Direct: 0.1 mg/dL (ref 0.0–0.3)
Total Bilirubin: 0.6 mg/dL (ref 0.2–1.2)
Total Protein: 7.5 g/dL (ref 6.0–8.3)

## 2019-06-01 MED ORDER — OMEPRAZOLE 40 MG PO CPDR: 40.0000 mg | DELAYED_RELEASE_CAPSULE | Freq: Every day | ORAL | 3 refills | Status: DC

## 2019-06-01 NOTE — Progress Notes (Signed)
Chief Complaint  Patient presents with  . Hospitalization Follow-up    History of Present Illness: HPI    49 year old female with cardiac risk factors of poorly controlled DM, HTN and cholesterol presents  For hospital ER follow up for acute chest pain.   She was seen in ER on 05/31/2019  HPI copied from ER note: heavy pain in the left side of her chest that seems to move into her mid back.  This been associated with some nausea, but she denies any vomiting or diarrhea.  She has not had any fevers, cough, or shortness of breath.  She does state that she has been experiencing occasional pain underneath her left breast for the past couple of weeks as well as some discomfort when she goes to take her medications.  She has reported breast pain  X several weeks in January and mammogram on 05/02/2019 was unremarkable.  Neg troponin x 2 ,  cbc, BMET unremarkable.  Pain improved with GI cocktail.  lipase not checked.  EKG:  Possible anterior infarct when compared to 10/2017, but there were no acute changes No past stress test   Felt likely GI GERD related pain.   She has been feeling more nauseous lately  In last week, very tired as well. She increased victoza  1 month ago Having some sour taste in mouth, burping.. using a lot of TUMs.  Stomach ache worse with food, water even. Now some midback pain, soreness in lower chest  Under breast,  No exertional component. Wt Readings from Last 3 Encounters:  06/01/19 156 lb (70.8 kg)  05/31/19 162 lb (73.5 kg)  05/11/19 163 lb (73.9 kg)     Brother with MI age 61   FBS  Much improved  with increase in victoza, stopping glipizide and on jardiance.148-158 down from 200s.   This visit occurred during the SARS-CoV-2 public health emergency.  Safety protocols were in place, including screening questions prior to the visit, additional usage of staff PPE, and extensive cleaning of exam room while observing appropriate contact time as indicated for  disinfecting solutions.   COVID 19 screen:  No recent travel or known exposure to COVID19 The patient denies respiratory symptoms of COVID 19 at this time. The importance of social distancing was discussed today.     Review of Systems  Constitutional: Negative for chills and fever.  HENT: Negative for congestion and ear pain.   Eyes: Negative for pain and redness.  Respiratory: Negative for cough and shortness of breath.   Cardiovascular: Positive for chest pain. Negative for palpitations and leg swelling.  Gastrointestinal: Positive for abdominal pain, heartburn and nausea. Negative for blood in stool, constipation, diarrhea and vomiting.  Genitourinary: Negative for dysuria.  Musculoskeletal: Negative for falls and myalgias.  Skin: Negative for rash.  Neurological: Negative for dizziness.  Psychiatric/Behavioral: Negative for depression. The patient is not nervous/anxious.       Past Medical History:  Diagnosis Date  . Allergy   . Arthritis    LEFT KNEE  . Diabetes mellitus without complication ([place])   . GERD (gastroesophageal reflux disease)    OCC  . History of kidney stones    H/O  . Hypertension   . MVP (mitral valve prolapse)   . PCOS (polycystic ovarian syndrome)     reports that she has been smoking cigarettes. She has a 4.50 pack-year smoking history. She has never used smokeless tobacco. She reports that she does not drink alcohol or use drugs.  Current Outpatient Medications:  .  acetaminophen (TYLENOL) 500 MG tablet, Take 1,000 mg by mouth 2 (two) times daily as needed for moderate pain., Disp: , Rfl:  .  Blood Glucose Monitoring Suppl (ONE TOUCH ULTRA SYSTEM KIT) w/Device KIT, Use to check blood sugar one to two times daily.  Dx: E11.9, Disp: 1 each, Rfl: 0 .  empagliflozin (JARDIANCE) 10 MG TABS tablet, Take 10 mg by mouth daily before breakfast., Disp: 30 tablet, Rfl: 11 .  glucose blood (ONE TOUCH ULTRA TEST) test strip, Use to check blood sugar one to two  times daily.  Dx: E11.9, Disp: 200 each, Rfl: 3 .  Insulin Pen Needle 31G X 8 MM MISC, Use to inject Victoza daily.  Dx: E11.65, Disp: 90 each, Rfl: 3 .  liraglutide (VICTOZA) 18 MG/3ML SOPN, INJECT SUBCUTANEOUSLY 1.2MG DAILY, Disp: 18 mL, Rfl: 0 .  losartan-hydrochlorothiazide (HYZAAR) 50-12.5 MG tablet, TAKE 1 TABLET BY MOUTH EVERY DAY, Disp: 30 tablet, Rfl: 0 .  metFORMIN (GLUCOPHAGE) 1000 MG tablet, TAKE 1 TABLET BY MOUTH TWO TIMES DAILY WITH A MEAL., Disp: 180 tablet, Rfl: 1 .  Multiple Vitamin (MULTIVITAMIN WITH MINERALS) TABS tablet, Take 2 tablets by mouth daily with lunch., Disp: , Rfl:  .  ondansetron (ZOFRAN ODT) 4 MG disintegrating tablet, Take 1 tablet (4 mg total) by mouth every 8 (eight) hours as needed for nausea or vomiting., Disp: 12 tablet, Rfl: 0 .  OneTouch Delica Lancets 00P MISC, Use to check blood sugar one to two times daily.  Dx: E11.9, Disp: 200 each, Rfl: 3 .  pravastatin (PRAVACHOL) 10 MG tablet, TAKE 1 TABLET BY MOUTH EVERY DAY, Disp: 90 tablet, Rfl: 3 .  venlafaxine XR (EFFEXOR-XR) 37.5 MG 24 hr capsule, TAKE 1 CAPSULE BY MOUTH DAILY WITH LUNCH, Disp: 90 capsule, Rfl: 1   Observations/Objective: Blood pressure 122/82, pulse (!) 105, temperature 98 F (36.7 C), temperature source Temporal, height _0  (1.676 m), weight 156 lb (70.8 kg), last menstrual period 10/12/2017, SpO2 98 %.  Physical Exam Constitutional:      General: She is not in acute distress.    Appearance: Normal appearance. She is well-developed. She is not ill-appearing or toxic-appearing.  HENT:     Head: Normocephalic.     Right Ear: Hearing, tympanic membrane, ear canal and external ear normal. Tympanic membrane is not erythematous, retracted or bulging.     Left Ear: Hearing, tympanic membrane, ear canal and external ear normal. Tympanic membrane is not erythematous, retracted or bulging.     Nose: No mucosal edema or rhinorrhea.     Right Sinus: No maxillary sinus tenderness or frontal sinus  tenderness.     Left Sinus: No maxillary sinus tenderness or frontal sinus tenderness.     Mouth/Throat:     Pharynx: Uvula midline.  Eyes:     General: Lids are normal. Lids are everted, no foreign bodies appreciated.     Conjunctiva/sclera: Conjunctivae normal.     Pupils: Pupils are equal, round, and reactive to light.  Neck:     Thyroid: No thyroid mass or thyromegaly.     Vascular: No carotid bruit.     Trachea: Trachea normal.  Cardiovascular:     Rate and Rhythm: Regular rhythm. Tachycardia present.     Pulses: Normal pulses.     Heart sounds: Normal heart sounds, S1 normal and S2 normal. No murmur. No friction rub. No gallop.   Pulmonary:     Effort: Pulmonary effort is normal. No tachypnea  or respiratory distress.     Breath sounds: Normal breath sounds. No decreased breath sounds, wheezing, rhonchi or rales.  Chest:     Chest wall: Tenderness present.       Comments:  Areas marked of chest wall ttp Abdominal:     General: Bowel sounds are normal.     Palpations: Abdomen is soft.     Tenderness: There is abdominal tenderness in the right upper quadrant and epigastric area. There is no right CVA tenderness, left CVA tenderness, guarding or rebound.  Musculoskeletal:     Cervical back: Normal range of motion and neck supple.  Skin:    General: Skin is warm and dry.     Findings: No rash.  Neurological:     Mental Status: She is alert.  Psychiatric:        Mood and Affect: Mood is not anxious or depressed.        Speech: Speech normal.        Behavior: Behavior normal. Behavior is cooperative.        Thought Content: Thought content normal.        Judgment: Judgment normal.      Assessment and Plan   Atypical chest pain  Agree chest pain more likely GI related given  Pt description of pain. Also MSK possible source   With history of very high triglycerides and on GLP1 RA... concern for pancreatitis.Marland Kitchen will eval hepatic panel and lipase today.  Hold VICTOZA  unless result returns.  Start PPI, if not improving in 2 weeks consider [place] of gallbladder and change to Protonix, if still not improving will plan GI referral.  Epigastric pain  Concerning for pancreatitis vs GERD vs PUD vs gallbladder disease.  Family history of heart disease Most likely  Current chest pain not cardiac in origin but pt high risk for her age for CAD given poorly controlled DM, high cholesterol, early CAD in family ( brother).  Will refer to Cardiology for consideration of stress test and other recommendations.   Continue statin.  Poorly controlled type 2 diabetes mellitus Improving control on jardiance and with higher dose of victoza. Given not clear if Victoza causing SE... hold for now.  Conitnue low carb diet.  Hypertriglyceridemia Puts pt at higher risk for pancreatitis... not at goal on statin but does not tolerate higher dose statin. MAy need to add back fenofibrate.Eliezer Lofts, MD

## 2019-06-01 NOTE — Assessment & Plan Note (Signed)
Puts pt at higher risk for pancreatitis... not at goal on statin but does not tolerate higher dose statin. MAy need to add back fenofibrate.Marland Kitchen

## 2019-06-01 NOTE — Assessment & Plan Note (Signed)
Concerning for pancreatitis vs GERD vs PUD vs gallbladder disease.

## 2019-06-01 NOTE — Patient Instructions (Addendum)
Stop Victoza. Start Prilosec 40 mg daily.  CALL if  not improving after 2 weeks.  If significantly better .. complete a full 4-6 week course of prilosec then wean off.   Avoid triggers. ( spicy foods, acidic, soda, alcohol, caffeine)   Please stop at the lab to have labs drawn.  We will call with cardiology referral given cardiac risk factors.

## 2019-06-01 NOTE — Assessment & Plan Note (Signed)
Most likely  Current chest pain not cardiac in origin but pt high risk for her age for CAD given poorly controlled DM, high cholesterol, early CAD in family ( brother).  Will refer to Cardiology for consideration of stress test and other recommendations.   Continue statin.

## 2019-06-01 NOTE — Assessment & Plan Note (Signed)
Improving control on jardiance and with higher dose of victoza. Given not clear if Victoza causing SE... hold for now.  Conitnue low carb diet.

## 2019-06-01 NOTE — Assessment & Plan Note (Addendum)
Agree chest pain more likely GI related given  Pt description of pain. Also MSK possible source   With history of very high triglycerides and on GLP1 RA... concern for pancreatitis.Marland Kitchen will eval hepatic panel and lipase today.  Hold VICTOZA unless result returns.  Start PPI, if not improving in 2 weeks consider [place] of gallbladder and change to Protonix, if still not improving will plan GI referral.

## 2019-06-07 DIAGNOSIS — R1013 Epigastric pain: Secondary | ICD-10-CM

## 2019-06-08 MED ORDER — ONDANSETRON 4 MG PO TBDP: 4.0000 mg | ORAL_TABLET | Freq: Three times a day (TID) | ORAL | 0 refills | Status: DC | PRN

## 2019-06-08 MED ORDER — PANTOPRAZOLE SODIUM 40 MG PO TBEC: 40.0000 mg | DELAYED_RELEASE_TABLET | Freq: Every day | ORAL | 3 refills | Status: DC

## 2019-06-08 NOTE — Addendum Note (Signed)
Addended by: Eliezer Lofts E on: 06/08/2019 05:26 PM   Modules accepted: Orders

## 2019-06-12 ENCOUNTER — Telehealth: Payer: Self-pay

## 2019-06-12 ENCOUNTER — Ambulatory Visit
Admission: RE | Admit: 2019-06-12 | Discharge: 2019-06-12 | Disposition: A | Discharge status: Home / Self Care | Payer: 59 | Source: Ambulatory Visit | Attending: Family Medicine | Admitting: Family Medicine

## 2019-06-12 ENCOUNTER — Other Ambulatory Visit: Payer: Self-pay

## 2019-06-12 DIAGNOSIS — R1013 Epigastric pain: Secondary | ICD-10-CM | POA: Diagnosis not present

## 2019-06-12 NOTE — Telephone Encounter (Signed)
Alyssa Warren with [facility] [place] called report for Abd RUQ is in epic and copy taken to Dr Rometta Emery area. Vicente Males said pt went to work.

## 2019-06-15 NOTE — Assessment & Plan Note (Signed)
Increase victoza to 1.8 mg  at night.  Stop glipizide. If fasting blood sugars not < 120.. start invokana in mornings.  Increase activity , decrease carbs in diet.

## 2019-06-15 NOTE — Assessment & Plan Note (Signed)
Well controlled. Continue current medication.  

## 2019-06-15 NOTE — Assessment & Plan Note (Signed)
Low fat low carb diet.  Restart omeg3 fatty acids.  SE to tricor and atorvastatin

## 2019-06-19 ENCOUNTER — Encounter: Payer: Self-pay | Admitting: Cardiology

## 2019-06-19 ENCOUNTER — Other Ambulatory Visit: Payer: Self-pay

## 2019-06-19 ENCOUNTER — Ambulatory Visit (INDEPENDENT_AMBULATORY_CARE_PROVIDER_SITE_OTHER): Payer: 59 | Admitting: Cardiology

## 2019-06-19 VITALS — BP 114/90 | HR 96 | Wt 156.5 lb

## 2019-06-19 DIAGNOSIS — E781 Pure hyperglyceridemia: Secondary | ICD-10-CM | POA: Diagnosis not present

## 2019-06-19 DIAGNOSIS — R079 Chest pain, unspecified: Secondary | ICD-10-CM | POA: Diagnosis not present

## 2019-06-19 DIAGNOSIS — Z8679 Personal history of other diseases of the circulatory system: Secondary | ICD-10-CM

## 2019-06-19 DIAGNOSIS — F17200 Nicotine dependence, unspecified, uncomplicated: Secondary | ICD-10-CM | POA: Diagnosis not present

## 2019-06-19 MED ORDER — ICOSAPENT ETHYL 1 G PO CAPS: 2.0000 g | ORAL_CAPSULE | Freq: Two times a day (BID) | ORAL | 5 refills | Status: DC

## 2019-06-19 MED ORDER — ATORVASTATIN CALCIUM 40 MG PO TABS: 40.0000 mg | ORAL_TABLET | Freq: Every day | ORAL | 5 refills | Status: DC

## 2019-06-19 MED ORDER — VICTOZA 18 MG/3ML ~~LOC~~ SOPN: PEN_INJECTOR | SUBCUTANEOUS | 1 refills | Status: DC

## 2019-06-19 NOTE — Patient Instructions (Signed)
Medication Instructions:  Your physician has recommended you make the following change in your medication:   1) STOP Pravastatin 2) START Atorvastatin (Lipitor) 40 mg daily. An Rx has been sent to your pharmacy 3) START Vascepa 2g (2 capsules) twice daily. An Rx has been sent to your pharmacy. This medication may requires a prior authorization by your insurance company, if so we will work on getting it completed.  *If you need a refill on your cardiac medications before your next appointment, please call your pharmacy*  Lab Work: Your physician recommends that you return for a FASTING lipid profile: in 3 months. Please have your lab drawn at the [facility] 1 week prior to your next appointment.  If you have labs (blood work) drawn today and your tests are completely normal, you will receive your results only by: Marland Kitchen MyChart Message (if you have MyChart) OR . A paper copy in the mail If you have any lab test that is abnormal or we need to change your treatment, we will call you to review the results.  Testing/Procedures: Your physician has requested that you have an echocardiogram. Echocardiography is a painless test that uses sound waves to create images of your heart. It provides your doctor with information about the size and shape of your heart and how well your heart's chambers and valves are working. This procedure takes approximately one hour. There are no restrictions for this procedure.    Follow-Up: At [facility], you and your health needs are our priority.  As part of our continuing mission to provide you with exceptional heart care, we have created designated Provider Care Teams.  These Care Teams include your primary Cardiologist (physician) and Advanced Practice Providers (APPs -  Physician Assistants and Nurse Practitioners) who all work together to provide you with the care you need, when you need it.  Your next appointment:   3 month(s)  The format for your next  appointment:   In Person  Provider:    You may see Kate Sable, MD or one of the following Advanced Practice Providers on your designated Care Team:    Murray Hodgkins, NP  Christell Faith, PA-C  Marrianne Mood, PA-C   Other Instructions N/A

## 2019-06-19 NOTE — Progress Notes (Signed)
Cardiology Office Note:    Date:  06/19/2019   ID:  Alyssa Warren, DOB Sep 23, 1970, MRN [id number]  PCP:  Jinny Sanders, MD  Cardiologist:  Kate Sable, MD  Electrophysiologist:  None   Referring MD: Jinny Sanders, MD   Chief Complaint  Patient presents with  . New Patient (Initial Visit)    Pt having chest pain/ discomfort. pt concerned w/ her diagnosis MVP. Meds verbally reviewed w/ pt.   Alyssa Warren is a 49 y.o. female who is being seen today for the evaluation of chest pain at the request of Diona Browner, Amy E, MD.   History of Present Illness:    Alyssa Warren is a 49 y.o. female with a hx of mitral valve prolapse, hyperlipidemia, hypertension, diabetes, current smoker x15 years who presents due to chest pain.  Patient states having on and off abdominal discomfort, nausea, heartburn for the past 6 months.  2 weeks ago she felt severe chest discomfort which is not exertional prompted her to go to the emergency room she thought she was having a heart attack. She was seen in the ED where EKG was normal with no ST changes, normal high-sensitivity troponin.  Symptoms were deemed secondary to GI etiology and patient started on a trial of GI cocktail.  She states her symptoms completely resolve 30 minutes after taking the GI cocktail.  She subsequently followed up with a primary care provider who started her on Protonix and symptoms have not returned.  She states having a history of high triglycerides, has tried fenofibrate's in the past but could not tolerate it.  She also states having a history of mitral valve prolapse diagnosed years ago.  She denies chest pain or shortness of breath at rest or with exertion.  Past Medical History:  Diagnosis Date  . Allergy   . Arthritis    LEFT KNEE  . Diabetes mellitus without complication ([place])   . GERD (gastroesophageal reflux disease)    OCC  . History of kidney stones    H/O  . Hypertension   . MVP (mitral  valve prolapse)   . PCOS (polycystic ovarian syndrome)     Past Surgical History:  Procedure Laterality Date  . BREAST BIOPSY Right 02/2012   x2. benign  . cyst removed from ovary    . CYSTOSCOPY  11/01/2017   Procedure: CYSTOSCOPY;  Surgeon: Gae Dry, MD;  Location: [facility] ORS;  Service: Gynecology;;  . Murrell Redden & CURRETTAGE/HYSTROSCOPY WITH NOVASURE ABLATION N/A 12/23/2016   Procedure: DILATATION & CURETTAGE/HYSTEROSCOPY ENDOMETRIAL ABLATION-MINERVA;  Surgeon: Gae Dry, MD;  Location: [facility] ORS;  Service: Gynecology;  Laterality: N/A;  . KNEE SURGERY Left   . LABIOPLASTY N/A 07/12/2017   Procedure: EXCISION OF LABIAL CYST;  Surgeon: Gae Dry, MD;  Location: [facility] ORS;  Service: Gynecology;  Laterality: N/A;  . LAPAROSCOPIC HYSTERECTOMY Bilateral 11/01/2017   Procedure: HYSTERECTOMY TOTAL LAPAROSCOPIC BILATERAL SALPINGECTOMY, RIGHT OVARIAN CYSTECTOMY;  Surgeon: Gae Dry, MD;  Location: [facility] ORS;  Service: Gynecology;  Laterality: Bilateral;  . LAPAROSCOPIC SALPINGO OOPHERECTOMY Left 12/23/2016   Procedure: LAPAROSCOPIC SALPINGO OOPHORECTOMY;  Surgeon: Gae Dry, MD;  Location: [facility] OR[facility];  Service: Gynecology;  Laterality: Left;  . plantar fasciitis release Bilateral   . TUBAL LIGATION      Current Medications: Current Meds  Medication Sig  . acetaminophen (TYLENOL) 500 MG tablet Take 1,000 mg by mouth 2 (two) times daily as needed for moderate pain.  . Blood Glucose Monitoring  Suppl (ONE TOUCH ULTRA SYSTEM KIT) w/Device KIT Use to check blood sugar one to two times daily.  Dx: E11.9  . empagliflozin (JARDIANCE) 10 MG TABS tablet Take 10 mg by mouth daily before breakfast.  . glucose blood (ONE TOUCH ULTRA TEST) test strip Use to check blood sugar one to two times daily.  Dx: E11.9  . Insulin Pen Needle 31G X 8 MM MISC Use to inject Victoza daily.  Dx: E11.65  . liraglutide (VICTOZA) 18 MG/3ML SOPN INJECT SUBCUTANEOUSLY 1.2MG DAILY  .  losartan-hydrochlorothiazide (HYZAAR) 50-12.5 MG tablet TAKE 1 TABLET BY MOUTH EVERY DAY  . metFORMIN (GLUCOPHAGE) 1000 MG tablet TAKE 1 TABLET BY MOUTH TWO TIMES DAILY WITH A MEAL.  . Multiple Vitamin (MULTIVITAMIN WITH MINERALS) TABS tablet Take 2 tablets by mouth daily with lunch.  . ondansetron (ZOFRAN ODT) 4 MG disintegrating tablet Take 1 tablet (4 mg total) by mouth every 8 (eight) hours as needed for nausea or vomiting.  Glory Rosebush Delica Lancets 84Z MISC Use to check blood sugar one to two times daily.  Dx: E11.9  . pantoprazole (PROTONIX) 40 MG tablet Take 1 tablet (40 mg total) by mouth daily.  Marland Kitchen venlafaxine XR (EFFEXOR-XR) 37.5 MG 24 hr capsule TAKE 1 CAPSULE BY MOUTH DAILY WITH LUNCH     Allergies:   Penicillins, Keflex [cephalexin], and Methyldopa   Social History   Socioeconomic History  . Marital status: Married    Spouse name: Not on file  . Number of children: Not on file  . Years of education: Not on file  . Highest education level: Not on file  Occupational History  . Not on file  Tobacco Use  . Smoking status: Current Some Day Smoker    Packs/day: 0.50    Years: 15.00    Pack years: 7.50    Types: Cigarettes    Last attempt to quit: 12/19/2016    Years since quitting: 2.4  . Smokeless tobacco: Never Used  . Tobacco comment: a few a day  Substance and Sexual Activity  . Alcohol use: No    Alcohol/week: 0.0 standard drinks  . Drug use: No  . Sexual activity: Never  Other Topics Concern  . Not on file  Social History Narrative  . Not on file   Social Determinants of Health   Financial Resource Strain:   . Difficulty of Paying Living Expenses: Not on file  Food Insecurity:   . Worried About Charity fundraiser in the Last Year: Not on file  . Ran Out of Food in the Last Year: Not on file  Transportation Needs:   . Lack of Transportation (Medical): Not on file  . Lack of Transportation (Non-Medical): Not on file  Physical Activity:   . Days of  Exercise per Week: Not on file  . Minutes of Exercise per Session: Not on file  Stress:   . Feeling of Stress : Not on file  Social Connections:   . Frequency of Communication with Friends and Family: Not on file  . Frequency of Social Gatherings with Friends and Family: Not on file  . Attends Religious Services: Not on file  . Active Member of Clubs or Organizations: Not on file  . Attends Archivist Meetings: Not on file  . Marital Status: Not on file     Family History: The patient's family history includes Breast cancer in her maternal aunt and paternal aunt; Heart attack in her brother.  ROS:   Please see  the history of present illness.     All other systems reviewed and are negative.  EKGs/Labs/Other Studies Reviewed:    The following studies were reviewed today:   EKG:  EKG is  ordered today.  The ekg ordered today demonstrates sinus rhythm, normal ECG.  Recent Labs: 05/31/2019: BUN 16; Creatinine, Ser 0.77; Hemoglobin 15.0; Platelets 293; Potassium 3.6; Sodium 139 06/01/2019: ALT 27  Recent Lipid Panel    Component Value Date/Time   CHOL 369 (H) 05/08/2019 0812   TRIG (H) 05/08/2019 0812    1472.0 Triglyceride is over 400; calculations on Lipids are invalid.   HDL 41.10 05/08/2019 0812   CHOLHDL 9 05/08/2019 0812   VLDL 50.0 (H) 05/21/2014 0934   LDLCALC 26 06/29/2013 0934   LDLDIRECT 95.0 05/08/2019 0812    Physical Exam:    VS:  BP 114/90 (BP Location: Right Arm, Patient Position: Sitting, Cuff Size: Normal)   Pulse 96   Wt 156 lb 8 oz (71 kg)   LMP 10/12/2017   SpO2 98%   BMI 25.26 kg/m     Wt Readings from Last 3 Encounters:  06/19/19 156 lb 8 oz (71 kg)  06/01/19 156 lb (70.8 kg)  05/31/19 162 lb (73.5 kg)     GEN:  Well nourished, well developed in no acute distress HEENT: Normal NECK: No JVD; No carotid bruits LYMPHATICS: No lymphadenopathy CARDIAC: RRR, no murmurs, rubs, gallops RESPIRATORY:  Clear to auscultation without rales,  wheezing or rhonchi  ABDOMEN: Soft, non-tender, non-distended MUSCULOSKELETAL:  No edema; No deformity  SKIN: Warm and dry NEUROLOGIC:  Alert and oriented x 3 PSYCHIATRIC:  Normal affect   ASSESSMENT:    1. Hypertriglyceridemia   2. Chest pain of uncertain etiology   3. Hx of mitral valve prolapse   4. Smoking    PLAN:    In order of problems listed above:  1. Patient with severely elevated triglyceride levels at 1472.  She also states being diagnosed with fatty liver on recent abdominal ultrasound likely secondary to poorly controlled lipid levels.  Her ASCVD 10-year risk is about 6.6% and moderate intensity statin is recommended.  Start  Lipitor 40 mg daily.  Will start patient on Vascepa 2 g twice daily.  Repeat lipid panel in 3 months. 2. Her chest pain symptoms suggest GI etiology.  They are currently resolved on antireflux medications.  Continue antireflux medications as prescribed. 3. She has a history of mitral valve prolapse.  Will evaluate for the presence of regurgitation with an echocardiogram. 4. The patient is a current smoker x15 years.  Smoking cessation advised.  Over 5 minutes spent counseling patient.  Follow-up after echocardiogram and lipid panel in 3 months.  This note was generated in part or whole with voice recognition software. Voice recognition is usually quite accurate but there are transcription errors that can and very often do occur. I apologize for any typographical errors that were not detected and corrected.  Medication Adjustments/Labs and Tests Ordered: Current medicines are reviewed at length with the patient today.  Concerns regarding medicines are outlined above.  Orders Placed This Encounter  Procedures  . Lipid panel  . EKG 12-Lead  . ECHOCARDIOGRAM COMPLETE   Meds ordered this encounter  Medications  . atorvastatin (LIPITOR) 40 MG tablet    Sig: Take 1 tablet (40 mg total) by mouth daily.    Dispense:  30 tablet    Refill:  5  .  icosapent Ethyl (VASCEPA) 1 g capsule  Sig: Take 2 capsules (2 g total) by mouth 2 (two) times daily.    Dispense:  120 capsule    Refill:  5    Patient Instructions  Medication Instructions:  Your physician has recommended you make the following change in your medication:   1) STOP Pravastatin 2) START Atorvastatin (Lipitor) 40 mg daily. An Rx has been sent to your pharmacy 3) START Vascepa 2g (2 capsules) twice daily. An Rx has been sent to your pharmacy. This medication may requires a prior authorization by your insurance company, if so we will work on getting it completed.  *If you need a refill on your cardiac medications before your next appointment, please call your pharmacy*  Lab Work: Your physician recommends that you return for a FASTING lipid profile: in 3 months. Please have your lab drawn at the [facility] 1 week prior to your next appointment.  If you have labs (blood work) drawn today and your tests are completely normal, you will receive your results only by: Marland Kitchen MyChart Message (if you have MyChart) OR . A paper copy in the mail If you have any lab test that is abnormal or we need to change your treatment, we will call you to review the results.  Testing/Procedures: Your physician has requested that you have an echocardiogram. Echocardiography is a painless test that uses sound waves to create images of your heart. It provides your doctor with information about the size and shape of your heart and how well your heart's chambers and valves are working. This procedure takes approximately one hour. There are no restrictions for this procedure.    Follow-Up: At [facility], you and your health needs are our priority.  As part of our continuing mission to provide you with exceptional heart care, we have created designated Provider Care Teams.  These Care Teams include your primary Cardiologist (physician) and Advanced Practice Providers (APPs -  Physician  Assistants and Nurse Practitioners) who all work together to provide you with the care you need, when you need it.  Your next appointment:   3 month(s)  The format for your next appointment:   In Person  Provider:    You may see Kate Sable, MD or one of the following Advanced Practice Providers on your designated Care Team:    Murray Hodgkins, NP  Christell Faith, PA-C  Marrianne Mood, PA-C   Other Instructions N/A     Signed, Kate Sable, MD  06/19/2019 9:48 AM    [place]

## 2019-06-21 ENCOUNTER — Telehealth: Payer: Self-pay

## 2019-06-21 ENCOUNTER — Telehealth: Payer: Self-pay | Admitting: Cardiology

## 2019-06-21 NOTE — Telephone Encounter (Signed)
  [employer] calling about prior auth for Vascepa    Please call if needing assistance

## 2019-06-21 NOTE — Telephone Encounter (Signed)
PA started through [employer] (Key: [id number]) Vascepa 1GM OR CAPS Wait for Determination Please wait for [employer] 2017 NCPDP to return a determination.

## 2019-06-21 NOTE — Telephone Encounter (Signed)
PA started through [employer] (Key: [id number]) Vascepa 1GM OR CAPS Wait for Determination Please wait for [employer] 2017 NCPDP to return a determination.

## 2019-06-22 NOTE — Telephone Encounter (Signed)
Prior Authorization for Vascepa DENIED by [facility].  Please see faxed decision notes/health criteria placed in nurse inbox. REF# [id number]

## 2019-06-23 ENCOUNTER — Other Ambulatory Visit: Payer: Self-pay | Admitting: Family Medicine

## 2019-06-26 ENCOUNTER — Telehealth: Payer: Self-pay

## 2019-06-26 NOTE — Telephone Encounter (Signed)
Alyssa Warren  This can certainly be appealed.  Do you have the denial reason?  A letter can be written by the MD (or [place]) stating why they need Vascepa, but it helps to be able to refute their reasoning in the letter as well.  If you want to send that over, we can look at it and start a letter for the MD to add to/sign  [name]

## 2019-06-26 NOTE — Telephone Encounter (Signed)
Routing to PharmD to review. Is there a chance that this could be approved with appeal? What are the best next steps? I have the Decision notes if you need them for review. THanks for your help?

## 2019-06-26 NOTE — Telephone Encounter (Signed)
PA started through [employer]mymeds.   [name]  Key: [id number]   PA Case ID: [id number]  Rx #: [id number]

## 2019-06-27 NOTE — Telephone Encounter (Signed)
Alyssa Warren,  Just faxed the information I have to fax [phone]. It is from [employer]. Please let me know what I need to do to help.  Thanks!

## 2019-06-28 NOTE — Telephone Encounter (Signed)
Hi Kristin, I realized that this morning when I got it back. I just refaxed the whole packet of papers to you at your Attention. Thanks so much for you help!

## 2019-06-28 NOTE — Telephone Encounter (Signed)
Hi  - found it.  Do me a favor and write at the top [place] or use a cover sheet.  The MR people tend to see the MD name and fax it right back to you!  I'll go thru the study information and write a letter in the next few days and get it back to you for the MD to sign.  We usually save the letters, because when the next patient has this problem, you can then just pull it out, make a few corrections to fit the new patient, then send it off.    Alyssa Warren

## 2019-07-06 ENCOUNTER — Telehealth: Payer: Self-pay | Admitting: *Deleted

## 2019-07-06 NOTE — Telephone Encounter (Signed)
-----   Message from Rockne Menghini, [place] sent at 07/05/2019  4:48 PM EST ----- Anderson Malta  I haven't forgotten about your appeal.  Can you reach out to the patient and see if she is tolerating the atorvastatin 40 mg?  I see she was on pravastatin 10 last year, can you also ask her if she had problems with that or why it was stopped.    I have the base of the letter done, just need to find that info to better tune my argument.  If she is tolerating the atorvastatin, we may just have to wait until she's been on it for 12 weeks, then they will cover, but if she's not tolerating it, she will have failed 2 statins and we can probably get approval.    Hope you're doing well, we still miss you over here.  [name]

## 2019-07-06 NOTE — Telephone Encounter (Signed)
Spoke to patient. States when she took pravastatin last year it caused major aching in her body, joint pain and fatigue. She is currently taking atorvastatin and is tolerating it thus far. Patient is very concerned about her Triglycerides and knows Dr. Garen Lah wanted her to start the Vascepa as soon as possible. She tried to use a coupon; however, her insurance still wouldn't cover it. She cannot afford to pay over $200 for the prescription in the meantime.  She will keep [place] updated on her progress with the Atorvastatin if any new symptoms arise.  She was understanding about this process though it is frustrating to her that she cannot get the medication yet. We have agreed to communicate with updates over MyChart when able. Again, she expressed much appreciation for your help.

## 2019-07-19 MED ORDER — VICTOZA 18 MG/3ML ~~LOC~~ SOPN: PEN_INJECTOR | SUBCUTANEOUS | 1 refills | Status: DC

## 2019-07-19 MED ORDER — METFORMIN HCL 1000 MG PO TABS: ORAL_TABLET | ORAL | 1 refills | Status: DC

## 2019-07-20 ENCOUNTER — Other Ambulatory Visit: Payer: 59

## 2019-07-24 MED ORDER — VICTOZA 18 MG/3ML ~~LOC~~ SOPN: 1.8000 mg | PEN_INJECTOR | Freq: Every day | SUBCUTANEOUS | 3 refills | Status: DC

## 2019-07-24 MED ORDER — VICTOZA 18 MG/3ML ~~LOC~~ SOPN: 1.8000 mg | PEN_INJECTOR | Freq: Every day | SUBCUTANEOUS | 0 refills | Status: DC

## 2019-07-24 MED ORDER — LOSARTAN POTASSIUM-HCTZ 50-12.5 MG PO TABS: 1.0000 | ORAL_TABLET | Freq: Every day | ORAL | 1 refills | Status: DC

## 2019-07-26 ENCOUNTER — Other Ambulatory Visit: Payer: Self-pay

## 2019-07-26 MED ORDER — VICTOZA 18 MG/3ML ~~LOC~~ SOPN: 1.8000 mg | PEN_INJECTOR | Freq: Every day | SUBCUTANEOUS | 0 refills | Status: DC

## 2019-08-10 ENCOUNTER — Ambulatory Visit: Payer: 59 | Admitting: Family Medicine

## 2019-08-23 ENCOUNTER — Other Ambulatory Visit: Payer: Self-pay | Admitting: Family Medicine

## 2019-08-26 ENCOUNTER — Other Ambulatory Visit: Payer: Self-pay | Admitting: Family Medicine

## 2019-09-14 ENCOUNTER — Ambulatory Visit: Payer: 59 | Admitting: Cardiology

## 2019-10-12 ENCOUNTER — Ambulatory Visit (INDEPENDENT_AMBULATORY_CARE_PROVIDER_SITE_OTHER): Payer: 59 | Admitting: Family Medicine

## 2019-10-12 ENCOUNTER — Other Ambulatory Visit: Payer: Self-pay

## 2019-10-12 ENCOUNTER — Encounter: Payer: Self-pay | Admitting: Family Medicine

## 2019-10-12 VITALS — BP 110/80 | HR 102 | Temp 98.2°F | Ht 66.0 in | Wt 148.0 lb

## 2019-10-12 DIAGNOSIS — E1165 Type 2 diabetes mellitus with hyperglycemia: Secondary | ICD-10-CM

## 2019-10-12 DIAGNOSIS — E785 Hyperlipidemia, unspecified: Secondary | ICD-10-CM

## 2019-10-12 DIAGNOSIS — E1159 Type 2 diabetes mellitus with other circulatory complications: Secondary | ICD-10-CM | POA: Diagnosis not present

## 2019-10-12 DIAGNOSIS — E1169 Type 2 diabetes mellitus with other specified complication: Secondary | ICD-10-CM

## 2019-10-12 DIAGNOSIS — I152 Hypertension secondary to endocrine disorders: Secondary | ICD-10-CM

## 2019-10-12 DIAGNOSIS — I10 Essential (primary) hypertension: Secondary | ICD-10-CM

## 2019-10-12 LAB — POCT GLYCOSYLATED HEMOGLOBIN (HGB A1C): Hemoglobin A1C: 10.3 % — AB (ref 4.0–5.6)

## 2019-10-12 MED ORDER — INSULIN PEN NEEDLE 31G X 8 MM MISC: 3 refills | Status: DC

## 2019-10-12 MED ORDER — VICTOZA 18 MG/3ML ~~LOC~~ SOPN: 1.8000 mg | PEN_INJECTOR | Freq: Every day | SUBCUTANEOUS | 11 refills | Status: DC

## 2019-10-12 MED ORDER — LANTUS SOLOSTAR 100 UNIT/ML ~~LOC~~ SOPN: 10.0000 [IU] | PEN_INJECTOR | Freq: Every day | SUBCUTANEOUS | 11 refills | Status: DC

## 2019-10-12 NOTE — Assessment & Plan Note (Signed)
Well controlled. Continue current medication.  

## 2019-10-12 NOTE — Assessment & Plan Note (Signed)
Stop jardiance as ineffective.  Continue metformin and victoza at max. Add new start insulin. Titrate up 2 units to FBS goal 120.  Encouraged exercise, weight loss, healthy eating habits.

## 2019-10-12 NOTE — Progress Notes (Signed)
Chief Complaint  Patient presents with   Diabetes    History of Present Illness: HPI  Op jardiance  49 year old female presents for follow up DM  Diabetes:   Worsened control of DM in last 6 months.  On  Metformin 1021m  tablet BID,  On victoza 1.8 mcg daily and jardiance 10 mg  Off glipizide Lab Results  Component Value Date   HGBA1C 10.3 (A) 10/12/2019  Using medications without difficulties: Hypoglycemic episodes: Hyperglycemic episodes: Feet problems: no ulcers Blood Sugars averaging: FBS 180-190, postpradinal 140 eye exam within last year: She has lost 8 lbs in last 6 months. Wt Readings from Last 3 Encounters:  10/12/19 148 lb (67.1 kg)  06/19/19 156 lb 8 oz (71 kg)  06/01/19 156 lb (70.8 kg)  Body mass index is 23.89 kg/m.    High triglycerides: due for re-eval   Cannot tolerated fenofibrate. Insurance did not cover vascepa. Taking fish oil 1000 mg once daily. BP at goal.  This visit occurred during the SARS-CoV-2 public health emergency.  Safety protocols were in place, including screening questions prior to the visit, additional usage of staff PPE, and extensive cleaning of exam room while observing appropriate contact time as indicated for disinfecting solutions.   COVID 19 screen:  No recent travel or known exposure to COVID19 The patient denies respiratory symptoms of COVID 19 at this time. The importance of social distancing was discussed today.     Review of Systems  Constitutional: Negative for chills and fever.  HENT: Negative for congestion and ear pain.   Eyes: Negative for pain and redness.  Respiratory: Negative for cough and shortness of breath.   Cardiovascular: Negative for chest pain, palpitations and leg swelling.  Gastrointestinal: Negative for abdominal pain, blood in stool, constipation, diarrhea, nausea and vomiting.  Genitourinary: Negative for dysuria.  Musculoskeletal: Negative for falls and myalgias.  Skin: Negative for rash.   Neurological: Negative for dizziness.  Psychiatric/Behavioral: Negative for depression. The patient is not nervous/anxious.       Past Medical History:  Diagnosis Date   Allergy    Arthritis    LEFT KNEE   Diabetes mellitus without complication ([facility])    GERD (gastroesophageal reflux disease)    OCC   History of kidney stones    H/O   Hypertension    MVP (mitral valve prolapse)    PCOS (polycystic ovarian syndrome)     reports that she has been smoking cigarettes. She has a 7.50 pack-year smoking history. She has never used smokeless tobacco. She reports that she does not drink alcohol and does not use drugs.   Current Outpatient Medications:    acetaminophen (TYLENOL) 500 MG tablet, Take 1,000 mg by mouth 2 (two) times daily as needed for moderate pain., Disp: , Rfl:    atorvastatin (LIPITOR) 40 MG tablet, Take 1 tablet (40 mg total) by mouth daily., Disp: 30 tablet, Rfl: 5   Blood Glucose Monitoring Suppl (ONE TOUCH ULTRA SYSTEM KIT) w/Device KIT, Use to check blood sugar one to two times daily.  Dx: E11.9, Disp: 1 each, Rfl: 0   cyclobenzaprine (FLEXERIL) 5 MG tablet, Take 5 mg by mouth 3 (three) times daily as needed for muscle spasms., Disp: , Rfl:    empagliflozin (JARDIANCE) 10 MG TABS tablet, Take 10 mg by mouth daily before breakfast., Disp: 30 tablet, Rfl: 11   glucose blood (ONE TOUCH ULTRA TEST) test strip, Use to check blood sugar one to two times daily.  Dx: E11.9, Disp: 200 each, Rfl: 3   Insulin Pen Needle 31G X 8 MM MISC, Use to inject Victoza daily.  Dx: E11.65, Disp: 90 each, Rfl: 3   liraglutide (VICTOZA) 18 MG/3ML SOPN, Inject 0.3 mLs (1.8 mg total) into the skin daily., Disp: 3 pen, Rfl: 0   losartan-hydrochlorothiazide (HYZAAR) 50-12.5 MG tablet, Take 1 tablet by mouth daily., Disp: 90 tablet, Rfl: 1   metFORMIN (GLUCOPHAGE) 1000 MG tablet, TAKE 1 TABLET BY MOUTH TWO TIMES DAILY WITH A MEAL, Disp: 180 tablet, Rfl: 1   Multiple Vitamin  (MULTIVITAMIN WITH MINERALS) TABS tablet, Take 2 tablets by mouth daily with lunch., Disp: , Rfl:    ondansetron (ZOFRAN ODT) 4 MG disintegrating tablet, Take 1 tablet (4 mg total) by mouth every 8 (eight) hours as needed for nausea or vomiting., Disp: 12 tablet, Rfl: 0   OneTouch Delica Lancets 44L MISC, Use to check blood sugar one to two times daily.  Dx: E11.9, Disp: 200 each, Rfl: 3   pantoprazole (PROTONIX) 40 MG tablet, Take 1 tablet (40 mg total) by mouth daily., Disp: 30 tablet, Rfl: 3   venlafaxine XR (EFFEXOR-XR) 37.5 MG 24 hr capsule, TAKE 1 CAPSULE BY MOUTH DAILY WITH LUNCH, Disp: 90 capsule, Rfl: 1   Observations/Objective: Blood pressure 110/80, pulse (!) 102, temperature 98.2 F (36.8 C), temperature source Temporal, height '5\' 6"'  (1.676 m), weight 148 lb (67.1 kg), last menstrual period 10/12/2017, SpO2 97 %.  Physical Exam Constitutional:      General: She is not in acute distress.    Appearance: Normal appearance. She is well-developed. She is not ill-appearing or toxic-appearing.  HENT:     Head: Normocephalic.     Right Ear: Hearing, tympanic membrane, ear canal and external ear normal. Tympanic membrane is not erythematous, retracted or bulging.     Left Ear: Hearing, tympanic membrane, ear canal and external ear normal. Tympanic membrane is not erythematous, retracted or bulging.     Nose: No mucosal edema or rhinorrhea.     Right Sinus: No maxillary sinus tenderness or frontal sinus tenderness.     Left Sinus: No maxillary sinus tenderness or frontal sinus tenderness.     Mouth/Throat:     Pharynx: Uvula midline.  Eyes:     General: Lids are normal. Lids are everted, no foreign bodies appreciated.     Conjunctiva/sclera: Conjunctivae normal.     Pupils: Pupils are equal, round, and reactive to light.  Neck:     Thyroid: No thyroid mass or thyromegaly.     Vascular: No carotid bruit.     Trachea: Trachea normal.  Cardiovascular:     Rate and Rhythm: Normal  rate and regular rhythm.     Pulses: Normal pulses.     Heart sounds: Normal heart sounds, S1 normal and S2 normal. No murmur heard.  No friction rub. No gallop.   Pulmonary:     Effort: Pulmonary effort is normal. No tachypnea or respiratory distress.     Breath sounds: Normal breath sounds. No decreased breath sounds, wheezing, rhonchi or rales.  Abdominal:     General: Bowel sounds are normal.     Palpations: Abdomen is soft.     Tenderness: There is no abdominal tenderness.  Musculoskeletal:     Cervical back: Normal range of motion and neck supple.  Skin:    General: Skin is warm and dry.     Findings: No rash.  Neurological:     Mental Status: She is  alert.  Psychiatric:        Mood and Affect: Mood is not anxious or depressed.        Speech: Speech normal.        Behavior: Behavior normal. Behavior is cooperative.        Thought Content: Thought content normal.        Judgment: Judgment normal.      Assessment and Plan Poorly controlled type 2 diabetes mellitus Stop jardiance as ineffective.  Continue metformin and victoza at max. Add new start insulin. Titrate up 2 units to FBS goal 120.  Encouraged exercise, weight loss, healthy eating habits.   Hypertension associated with diabetes ([place]) Well controlled. Continue current medication.   Hyperlipidemia associated with type 2 diabetes mellitus LDL at goal on statin. Not able to start Vascepa due to cost. Start high dose OTC fish oil 400 mg divided daily.       Eliezer Lofts, MD

## 2019-10-12 NOTE — Patient Instructions (Addendum)
Gradually increase fish oil to 4000 mg divided daily if tolerate. In addition to atorvastatin.  Stop jardiance. Start Lntus solostar 10 units daily.  Titrate  up the units of Lantus by 2 units every 2 days until fasting blood sugars are at goal < 120.

## 2019-10-12 NOTE — Assessment & Plan Note (Signed)
LDL at goal on statin. Not able to start Vascepa due to cost. Start high dose OTC fish oil 400 mg divided daily.

## 2019-10-30 MED ORDER — LOSARTAN POTASSIUM-HCTZ 50-12.5 MG PO TABS: 1.0000 | ORAL_TABLET | Freq: Every day | ORAL | 1 refills | Status: DC

## 2019-10-30 MED ORDER — PANTOPRAZOLE SODIUM 40 MG PO TBEC: 40.0000 mg | DELAYED_RELEASE_TABLET | Freq: Every day | ORAL | 1 refills | Status: DC

## 2019-11-06 ENCOUNTER — Other Ambulatory Visit: Payer: 59

## 2019-11-09 ENCOUNTER — Other Ambulatory Visit: Payer: 59

## 2019-11-09 ENCOUNTER — Other Ambulatory Visit (INDEPENDENT_AMBULATORY_CARE_PROVIDER_SITE_OTHER): Payer: 59

## 2019-11-09 ENCOUNTER — Other Ambulatory Visit: Payer: Self-pay

## 2019-11-09 ENCOUNTER — Telehealth: Payer: Self-pay | Admitting: Family Medicine

## 2019-11-09 DIAGNOSIS — E1169 Type 2 diabetes mellitus with other specified complication: Secondary | ICD-10-CM

## 2019-11-09 DIAGNOSIS — E785 Hyperlipidemia, unspecified: Secondary | ICD-10-CM | POA: Diagnosis not present

## 2019-11-09 LAB — LIPID PANEL
Cholesterol: 249 mg/dL — ABNORMAL HIGH (ref 0–200)
HDL: 42.5 mg/dL (ref >39.00)
Total CHOL/HDL Ratio: 6
Triglycerides: 449 mg/dL — ABNORMAL HIGH (ref 0.0–149.0)

## 2019-11-09 LAB — LDL CHOLESTEROL, DIRECT: Direct LDL: 133 mg/dL

## 2019-11-09 NOTE — Telephone Encounter (Signed)
-----   Message from Ellamae Sia sent at 11/09/2019  7:19 AM EDT ----- Regarding: lab orders for today, 7.16.21 Lab orders, no f/u

## 2019-11-29 MED ORDER — METFORMIN HCL 1000 MG PO TABS: ORAL_TABLET | ORAL | 1 refills | Status: DC

## 2019-11-29 MED ORDER — VENLAFAXINE HCL ER 37.5 MG PO CP24: ORAL_CAPSULE | ORAL | 1 refills | Status: DC

## 2020-02-01 LAB — HM DIABETES EYE EXAM

## 2020-03-11 ENCOUNTER — Other Ambulatory Visit: Payer: Self-pay | Admitting: Family Medicine

## 2020-03-11 DIAGNOSIS — Z1231 Encounter for screening mammogram for malignant neoplasm of breast: Secondary | ICD-10-CM

## 2020-04-03 ENCOUNTER — Telehealth: Payer: Self-pay

## 2020-04-03 NOTE — Telephone Encounter (Signed)
[facility] - Client TELEPHONE ADVICE RECORD [facility] Patient Name: Alyssa Warren Gender: Female DOB: 1970/10/30 Age: 49 Y 2 M 16 D Return Phone Number: [phone] (Primary), [phone] (Secondary) Address[place] [address] Client [facility] - Client Client Site [facility] - Day Physician Eliezer Lofts - MD Contact Type Call Who Is Calling Patient / Member / Family / Caregiver Call Type Triage / Clinical Relationship To Patient Self Return Phone Number [phone] (Primary) Chief Complaint Fever (non urgent symptom) (> THREE MONTHS) Reason for Call Symptomatic / Request for Health Information Initial Comment Pt has had fever since Monday and developed a cough and earache. Temp is 99 at this time. She is diabetic. Caller was tested for the flu and covid yesterday, but no results yet. Translation No Nurse Assessment Nurse: Donna Christen, RN, Legrand Como Date/Time Eilene Ghazi Time): 04/03/2020 3:05:38 PM Confirm and document reason for call. If symptomatic, describe symptoms. ---Pt has had fever since Monday and developed a cough last night and earache. Has congestion. Temp is 99 orally at this time. She is diabetic. Caller was tested for the flu and covid yesterday at [facility], but no results yet. Had a headache on Monday that went away. Does the patient have any new or worsening symptoms? ---Yes Will a triage be completed? ---Yes Related visit to physician within the last 2 weeks? ---No Does the PT have any chronic conditions? (i.e. diabetes, asthma, this includes High risk factors for pregnancy, etc.) ---Yes List chronic conditions. ---Dm2, HTN Is the patient pregnant or possibly pregnant? (Ask all females between the ages of 42-55) ---No Is this a behavioral health or substance abuse call? ---No Guidelines Guideline Title Affirmed Question Affirmed Notes Nurse Date/Time  (Eastern Time) COVID-19 - Diagnosed or Suspected HIGH RISK for severe COVID complications (e.g., age > 48 years, obesity with BMI > 25, pregnant, chronic lung disease or other chronic Darleen Crocker 04/03/2020 3:07:11 PM PLEASE NOTE: All timestamps contained within this report are represented as [place] Standard Time. CONFIDENTIALTY NOTICE: This fax transmission is intended only for the addressee. It contains information that is legally privileged, confidential or otherwise protected from use or disclosure. If you are not the intended recipient, you are strictly prohibited from reviewing, disclosing, copying using or disseminating any of this information or taking any action in reliance on or regarding this information. If you have received this fax in error, please notify us immediately by telephone so that we can arrange for its return to [place]. Phone: [phone], Toll-Free: [phone], Fax: [phone] Page: 2 of 2 Call Id: [id number] Guidelines Guideline Title Affirmed Question Affirmed Notes Nurse Date/Time Eilene Ghazi Time) medical condition) (Exception: Already seen by PCP and no new or worsening symptoms.) Disp. Time Eilene Ghazi Time) Disposition Final User 04/03/2020 3:11:31 PM Call PCP Now Yes Donna Christen, RN, Gerome Sam Disagree/Comply Comply Caller Understands Yes PreDisposition Call Doctor Care Advice Given Per Guideline CALL PCP NOW: * TELEMEDICINE: Telemedicine is often a preferred source of second-level triage and care during this pandemic. Many practices and some hospitals now offer a telemedicine (virtual visit) service. There are also many national telemedicine companies that are delivering COVID-19 care. * IBUPROFEN (E.G., MOTRIN, ADVIL): Take 400 mg (two 200 mg pills) by mouth every 6 hours. The most you should take each day is 1,200 mg (six 200 mg pills), unless your doctor has told you to take more. CALL BACK IF: * You become worse CARE ADVICE given per  COVID-19 -  DIAGNOSED OR SUSPECTED (Adult) guideline. * Telemedicine may be your best choice for care during this COVID-19 outbreak. * COUGH DROPS: Over-the-counter cough drops can help a lot, especially for mild coughs. They soothe an irritated throat and remove the tickle sensation in the back of the throat. Cough drops are easy to carry with you. Referrals REFERRED TO PCP OFFICE

## 2020-04-03 NOTE — Telephone Encounter (Signed)
Pt already has appt scheduled with Glenda Chroman FNP for video visit on 04/04/20 at 12noon.

## 2020-04-03 NOTE — Telephone Encounter (Signed)
Noted  

## 2020-04-04 ENCOUNTER — Telehealth (INDEPENDENT_AMBULATORY_CARE_PROVIDER_SITE_OTHER): Payer: 59 | Admitting: Family Medicine

## 2020-04-04 ENCOUNTER — Other Ambulatory Visit: Payer: Self-pay

## 2020-04-04 ENCOUNTER — Encounter: Payer: Self-pay | Admitting: Family Medicine

## 2020-04-04 VITALS — Temp 98.9°F | Wt 152.0 lb

## 2020-04-04 DIAGNOSIS — U071 COVID-19: Secondary | ICD-10-CM | POA: Diagnosis not present

## 2020-04-04 DIAGNOSIS — E1165 Type 2 diabetes mellitus with hyperglycemia: Secondary | ICD-10-CM | POA: Diagnosis not present

## 2020-04-04 NOTE — Progress Notes (Signed)
Virtual Visit via Video Note  I connected with Alyssa Warren on 04/04/20 at 12:00 PM EST by a video enabled telemedicine application and verified that I am speaking with the correct person using two identifiers.  Due to technical difficulties, we were unable to use video portion and visit was completed with audio only.  Location: Patient: In her home Provider: [place] Persons participating in virtual visit: Patient, provider   I discussed the limitations of evaluation and management by telemedicine and the availability of in person appointments. The patient expressed understanding and agreed to proceed.  History of Present Illness: Chief Complaint  Patient presents with  . Fatigue  . Cough  . Covid Positive    Onset of symptoms 03/31/20. Tested positive on 04/02/20.   This is a 49 year old female who presents today for above chief complaint.  She  has a past medical history of Allergy, Arthritis, Diabetes mellitus without complication ([place]), GERD (gastroesophageal reflux disease), History of kidney stones, Hypertension, MVP (mitral valve prolapse), and PCOS (polycystic ovarian syndrome). She is not vaccinated against COVID-19.  She reports onset of headache and cough on 03/31/2020.  She was tested for COVID-19 on 04/02/2020 and received results today that were positive.  She is interested in a monoclonal antibody infusion given her diabetes.  She reports that she ran a fever on Monday but has not run a fever over 100.  She has had a little cough yesterday with small amount of beige phlegm but slept well through the night and has not had any cough today.  She denies shortness of breath, wheeze.  She reports that she feels a little achy and had had a headache several days ago which has since resolved.  She has taken some ibuprofen and Robitussin-DM. She has history of diabetes mellitus and takes Lantus 10 units at night.  She reports that she had elevated blood sugar of 266 this  morning after having coffee and crackers.  She took an additional 10 units of Lantus this morning.  She reports that her blood sugar was 142 fasting yesterday.   Observations/Objective: Patient is alert and answers questions appropriately.  She is normally conversive without any increased work of breathing.  No audible wheeze or witnessed cough.  Mood and affect are appropriate. Temp 98.9 F (37.2 C) (Oral)   Wt 152 lb (68.9 kg)   LMP 10/12/2017   BMI 24.53 kg/m  Wt Readings from Last 3 Encounters:  04/04/20 152 lb (68.9 kg)  10/12/19 148 lb (67.1 kg)  06/19/19 156 lb 8 oz (71 kg)     Assessment and Plan: 1. COVID-19 -Provided information regarding follow-up, when to access 911/ER/urgent care -Follow-up if worsening symptoms, increased fever, increased sputum production -Continue symptomatic treatment as needed -Her information was submitted to contact for consideration for monoclonal antibody infusion.  She was advised that this may take 1 to 2 days to receive a call back - [place] - Temperature monitoring; Future  2. Poorly controlled type 2 diabetes mellitus ([place]) -Advised her to ensure good fluid or good fluid intake, eat very low carbohydrates during acute illness, increase monitoring of blood sugar   Clarene Reamer, FNP-BC  [facility]  04/04/2020 12:36 PM   Follow Up Instructions:    I discussed the assessment and treatment plan with the patient. The patient was provided an opportunity to ask questions and all were answered. The patient agreed with the plan  and demonstrated an understanding of the instructions.   The patient was advised to call back or seek an in-person evaluation if the symptoms worsen or if the condition fails to improve as anticipated.    Elby Beck, FNP

## 2020-04-07 ENCOUNTER — Other Ambulatory Visit (HOSPITAL_COMMUNITY): Payer: Self-pay | Admitting: Family

## 2020-04-07 ENCOUNTER — Telehealth: Payer: Self-pay | Admitting: Family Medicine

## 2020-04-07 DIAGNOSIS — U071 COVID-19: Secondary | ICD-10-CM

## 2020-04-07 NOTE — Telephone Encounter (Signed)
Called and spoke with patient. Will reach out to contact for Monoclonal Antibody Infusion, she was added to the queue on 04/04/20. Not sure hy she has not been contacted. She has been drinking some Sprite. Little appetite. Discussed avoiding added sugars.

## 2020-04-07 NOTE — Telephone Encounter (Signed)
MAB infusion team aware of patient and will follow up.

## 2020-04-07 NOTE — Progress Notes (Signed)
I connected by phone with Alyssa Warren on 04/07/2020 at 4:30 PM to discuss the potential use of a new treatment for mild to moderate COVID-19 viral infection in non-hospitalized patients.  This patient is a 49 y.o. female that meets the FDA criteria for Emergency Use Authorization of COVID monoclonal antibody casirivimab/imdevimab, bamlanivimab/eteseviamb, or sotrovimab.  Has a (+) direct SARS-CoV-2 viral test result  Has mild or moderate COVID-19   Is NOT hospitalized due to COVID-19  Is within 10 days of symptom onset  Has at least one of the high risk factor(s) for progression to severe COVID-19 and/or hospitalization as defined in EUA.  Specific high risk criteria : Diabetes and Cardiovascular disease or hypertension   Symptoms of H/A, cough began 03/31/20.   I have spoken and communicated the following to the patient or parent/caregiver regarding COVID monoclonal antibody treatment:  1. FDA has authorized the emergency use for the treatment of mild to moderate COVID-19 in adults and pediatric patients with positive results of direct SARS-CoV-2 viral testing who are 26 years of age and older weighing at least 40 kg, and who are at high risk for progressing to severe COVID-19 and/or hospitalization.  2. The significant known and potential risks and benefits of COVID monoclonal antibody, and the extent to which such potential risks and benefits are unknown.  3. Information on available alternative treatments and the risks and benefits of those alternatives, including clinical trials.  4. Patients treated with COVID monoclonal antibody should continue to self-isolate and use infection control measures (e.g., wear mask, isolate, social distance, avoid sharing personal items, clean and disinfect "high touch" surfaces, and frequent handwashing) according to CDC guidelines.   5. The patient or parent/caregiver has the option to accept or refuse COVID monoclonal antibody  treatment.  After reviewing this information with the patient, the patient has agreed to receive one of the available covid 19 monoclonal antibodies and will be provided an appropriate fact sheet prior to infusion. [name], NP 04/07/2020 4:30 PM

## 2020-04-07 NOTE — Telephone Encounter (Signed)
Patient left a voicemail stating that she has been waiting to hear back about the infusion for covid. . Patient stated that she is concerned that she has not heard anything back from [facility]. Patient stated that she is concerned that she is going to run out of time for the infusion.

## 2020-04-07 NOTE — Telephone Encounter (Signed)
PT CALLED IN WANTED TO KNOW ABOUT THE REFERRAL TO [facility] FOR THE ANTIBODY INFUSION DUE TO SHE TESTED POSITIVE FOR COVID, AND ALSO HER BLOOD SUGAR IS GOING UP  SUGAR- 246 ( 9 AM)

## 2020-04-08 ENCOUNTER — Ambulatory Visit (HOSPITAL_COMMUNITY)
Admission: RE | Admit: 2020-04-08 | Discharge: 2020-04-08 | Disposition: A | Discharge status: Home / Self Care | Payer: 59 | Source: Ambulatory Visit | Attending: Pulmonary Disease | Admitting: Pulmonary Disease

## 2020-04-08 DIAGNOSIS — U071 COVID-19: Secondary | ICD-10-CM | POA: Diagnosis not present

## 2020-04-08 MED ORDER — FAMOTIDINE IN NACL 20-0.9 MG/50ML-% IV SOLN: 20.0000 mg | Freq: Once | INTRAVENOUS | Status: DC | PRN

## 2020-04-08 MED ORDER — METHYLPREDNISOLONE SODIUM SUCC 125 MG IJ SOLR: 125.0000 mg | Freq: Once | INTRAMUSCULAR | Status: DC | PRN

## 2020-04-08 MED ORDER — EPINEPHRINE 0.3 MG/0.3ML IJ SOAJ: 0.3000 mg | Freq: Once | INTRAMUSCULAR | Status: DC | PRN

## 2020-04-08 MED ORDER — ALBUTEROL SULFATE HFA 108 (90 BASE) MCG/ACT IN AERS: 2.0000 | INHALATION_SPRAY | Freq: Once | RESPIRATORY_TRACT | Status: DC | PRN

## 2020-04-08 MED ORDER — SODIUM CHLORIDE 0.9 % IV SOLN: INTRAVENOUS | Status: DC | PRN

## 2020-04-08 MED ORDER — SODIUM CHLORIDE 0.9 % IV SOLN: Freq: Once | INTRAVENOUS | Status: AC

## 2020-04-08 MED ORDER — DIPHENHYDRAMINE HCL 50 MG/ML IJ SOLN: 50.0000 mg | Freq: Once | INTRAMUSCULAR | Status: DC | PRN

## 2020-04-08 NOTE — Discharge Instructions (Signed)
10 Things You Can Do to Manage Your COVID-19 Symptoms at Home If you have possible or confirmed COVID-19: 1. Stay home from work and school. And stay away from other public places. If you must go out, avoid using any kind of public transportation, ridesharing, or taxis. 2. Monitor your symptoms carefully. If your symptoms get worse, call your healthcare provider immediately. 3. Get rest and stay hydrated. 4. If you have a medical appointment, call the healthcare provider ahead of time and tell them that you have or may have COVID-19. 5. For medical emergencies, call 911 and notify the dispatch personnel that you have or may have COVID-19. 6. Cover your cough and sneezes with a tissue or use the inside of your elbow. 7. Wash your hands often with soap and water for at least 20 seconds or clean your hands with an alcohol-based hand sanitizer that contains at least 60% alcohol. 8. As much as possible, stay in a specific room and away from other people in your home. Also, you should use a separate bathroom, if available. If you need to be around other people in or outside of the home, wear a mask. 9. Avoid sharing personal items with other people in your household, like dishes, towels, and bedding. 10. Clean all surfaces that are touched often, like counters, tabletops, and doorknobs. Use household cleaning sprays or wipes according to the label instructions. cdc.gov/coronavirus 10/25/2018 This information is not intended to replace advice given to you by your health care provider. Make sure you discuss any questions you have with your health care provider. Document Revised: 03/29/2019 Document Reviewed: 03/29/2019 Elsevier Patient Education  2020 Elsevier Inc. What types of side effects do monoclonal antibody drugs cause?  Common side effects  In general, the more common side effects caused by monoclonal antibody drugs include: . Allergic reactions, such as hives or itching . Flu-like signs and  symptoms, including chills, fatigue, fever, and muscle aches and pains . Nausea, vomiting . Diarrhea . Skin rashes . Low blood pressure   The CDC is recommending patients who receive monoclonal antibody treatments wait at least 90 days before being vaccinated.  Currently, there are no data on the safety and efficacy of mRNA COVID-19 vaccines in persons who received monoclonal antibodies or convalescent plasma as part of COVID-19 treatment. Based on the estimated half-life of such therapies as well as evidence suggesting that reinfection is uncommon in the 90 days after initial infection, vaccination should be deferred for at least 90 days, as a precautionary measure until additional information becomes available, to avoid interference of the antibody treatment with vaccine-induced immune responses. If you have any questions or concerns after the infusion please call the Advanced Practice Provider on call at [phone]. This number is ONLY intended for your use regarding questions or concerns about the infusion post-treatment side-effects.  Please do not provide this number to others for use. For return to work notes please contact your primary care provider.   If someone you know is interested in receiving treatment please have them call the COVID hotline at [phone].   

## 2020-04-08 NOTE — Progress Notes (Signed)
°  Diagnosis: COVID-19  Physician: Dr. Joya Gaskins  Procedure: Covid Infusion Clinic Med: bamlanivimab\etesevimab infusion - Provided patient with bamlanimivab\etesevimab fact sheet for patients, parents and caregivers prior to infusion.  Complications: No immediate complications noted.  Discharge: Discharged home   Alyssa Warren 04/08/2020

## 2020-04-08 NOTE — Progress Notes (Signed)
Patient reviewed Fact Sheet for Patients, Parents, and Caregivers for Emergency Use Authorization (EUA) of Bam/Ete for the Treatment of Coronavirus. Patient also reviewed and is agreeable to the estimated cost of treatment. Patient is agreeable to proceed.

## 2020-04-11 ENCOUNTER — Other Ambulatory Visit (HOSPITAL_COMMUNITY): Payer: Self-pay

## 2020-05-06 ENCOUNTER — Other Ambulatory Visit: Payer: Self-pay

## 2020-05-06 ENCOUNTER — Ambulatory Visit
Admission: RE | Admit: 2020-05-06 | Discharge: 2020-05-06 | Disposition: A | Discharge status: Home / Self Care | Payer: No Typology Code available for payment source | Source: Ambulatory Visit | Attending: Family Medicine | Admitting: Family Medicine

## 2020-05-06 DIAGNOSIS — Z1231 Encounter for screening mammogram for malignant neoplasm of breast: Secondary | ICD-10-CM | POA: Diagnosis present

## 2020-05-08 NOTE — Telephone Encounter (Signed)
Noted  

## 2020-05-08 NOTE — Telephone Encounter (Signed)
I spoke with pt; starting on 05/07/20 pt started last night with a dull pain in lt breast that does not radiate anywhere but does not go away. Pt had difficulty describing the severity of the pain; pt took BP earlier 127/96 P 96. Pt said last night she felt her heart racing. Pt has been under a lot of stress and that is likely to continue. pts brother has pancreatic cancer.  Pt does have prod cough with light brown phlegm on and off. No fever or SOB and no other covid symptoms than already listed. Pt tested + covid on 03/31/20. There are no available appts at [facility] 05/08/20 or 05/09/20. Pt is not sure if she will go to UC or not. Pt said if her condition worsened she would go to UC. UC & ED precautions given and pt voiced  Understanding. Sending note to DR Diona Browner and Butch Penny CMA.

## 2020-05-08 NOTE — Telephone Encounter (Signed)
Alyssa Warren, Please call and triage. 

## 2020-06-26 MED ORDER — PANTOPRAZOLE SODIUM 40 MG PO TBEC: 40.0000 mg | DELAYED_RELEASE_TABLET | Freq: Every day | ORAL | 0 refills | Status: DC

## 2020-07-25 ENCOUNTER — Ambulatory Visit: Payer: No Typology Code available for payment source | Admitting: Family Medicine

## 2020-07-29 ENCOUNTER — Encounter: Payer: Self-pay | Admitting: Family Medicine

## 2020-07-29 ENCOUNTER — Other Ambulatory Visit: Payer: Self-pay

## 2020-07-29 ENCOUNTER — Ambulatory Visit: Payer: No Typology Code available for payment source | Admitting: Family Medicine

## 2020-07-29 VITALS — BP 130/90 | HR 103 | Temp 98.3°F | Ht 66.0 in | Wt 156.5 lb

## 2020-07-29 DIAGNOSIS — F418 Other specified anxiety disorders: Secondary | ICD-10-CM | POA: Insufficient documentation

## 2020-07-29 DIAGNOSIS — F320 Major depressive disorder, single episode, mild: Secondary | ICD-10-CM

## 2020-07-29 DIAGNOSIS — R1013 Epigastric pain: Secondary | ICD-10-CM | POA: Diagnosis not present

## 2020-07-29 DIAGNOSIS — E781 Pure hyperglyceridemia: Secondary | ICD-10-CM | POA: Diagnosis not present

## 2020-07-29 DIAGNOSIS — R142 Eructation: Secondary | ICD-10-CM

## 2020-07-29 DIAGNOSIS — Z8249 Family history of ischemic heart disease and other diseases of the circulatory system: Secondary | ICD-10-CM

## 2020-07-29 DIAGNOSIS — E1165 Type 2 diabetes mellitus with hyperglycemia: Secondary | ICD-10-CM

## 2020-07-29 DIAGNOSIS — Z800 Family history of malignant neoplasm of digestive organs: Secondary | ICD-10-CM | POA: Insufficient documentation

## 2020-07-29 DIAGNOSIS — R002 Palpitations: Secondary | ICD-10-CM

## 2020-07-29 DIAGNOSIS — K219 Gastro-esophageal reflux disease without esophagitis: Secondary | ICD-10-CM

## 2020-07-29 LAB — COMPREHENSIVE METABOLIC PANEL
ALT: 17 U/L (ref 0–35)
AST: 15 U/L (ref 0–37)
Albumin: 4.3 g/dL (ref 3.5–5.2)
Alkaline Phosphatase: 63 U/L (ref 39–117)
BUN: 14 mg/dL (ref 6–23)
CO2: 32 mEq/L (ref 19–32)
Calcium: 9.5 mg/dL (ref 8.4–10.5)
Chloride: 102 mEq/L (ref 96–112)
Creatinine, Ser: 0.65 mg/dL (ref 0.40–1.20)
GFR: 102.58 mL/min (ref >60.00)
Glucose, Bld: 205 mg/dL — ABNORMAL HIGH (ref 70–99)
Potassium: 3.9 mEq/L (ref 3.5–5.1)
Sodium: 142 mEq/L (ref 135–145)
Total Bilirubin: 0.6 mg/dL (ref 0.2–1.2)
Total Protein: 6.9 g/dL (ref 6.0–8.3)

## 2020-07-29 LAB — LIPID PANEL
Cholesterol: 214 mg/dL — ABNORMAL HIGH (ref 0–200)
HDL: 40.3 mg/dL (ref >39.00)
Total CHOL/HDL Ratio: 5
Triglycerides: 513 mg/dL — ABNORMAL HIGH (ref 0.0–149.0)

## 2020-07-29 LAB — LIPASE: Lipase: 51 U/L (ref 11.0–59.0)

## 2020-07-29 LAB — LDL CHOLESTEROL, DIRECT: Direct LDL: 94 mg/dL

## 2020-07-29 LAB — HEMOGLOBIN A1C: Hgb A1c MFr Bld: 10.5 % — ABNORMAL HIGH (ref 4.6–6.5)

## 2020-07-29 MED ORDER — HYDROXYZINE HCL 10 MG PO TABS: 10.0000 mg | ORAL_TABLET | Freq: Three times a day (TID) | ORAL | 0 refills | Status: DC | PRN

## 2020-07-29 MED ORDER — LOSARTAN POTASSIUM-HCTZ 50-12.5 MG PO TABS: 1.0000 | ORAL_TABLET | Freq: Every day | ORAL | 1 refills | Status: DC

## 2020-07-29 MED ORDER — ATORVASTATIN CALCIUM 40 MG PO TABS
40.0000 mg | ORAL_TABLET | Freq: Every day | ORAL | 3 refills | Status: DC
Start: 2020-07-29 — End: 2021-08-03

## 2020-07-29 MED ORDER — ONDANSETRON 4 MG PO TBDP: 4.0000 mg | ORAL_TABLET | Freq: Three times a day (TID) | ORAL | 0 refills | Status: AC | PRN

## 2020-07-29 NOTE — Patient Instructions (Addendum)
Please stop at the lab to have labs drawn. We will start with labs but will plan further imaging.. [place] or CT scan to look into issues further.  We will likely plan GI referral as well.Alyssa Warren requested.  Can try atarax as needed for anxiety.

## 2020-07-29 NOTE — Progress Notes (Signed)
Patient ID: Alyssa Warren, female    DOB: 1970/05/19, 50 y.o.   MRN: [id number]  This visit was conducted in person.  BP 130/90   Pulse (!) 103   Temp 98.3 F (36.8 C) (Temporal)   Ht '5\' 6"'  (1.676 m)   Wt 156 lb 8 oz (71 kg)   LMP 10/12/2017   SpO2 96%   BMI 25.26 kg/m    CC:  Chief Complaint  Patient presents with  . Gastroesophageal Reflux    Subjective:   HPI: Alyssa Warren is a 50 y.o. female presenting on 07/29/2020 for Gastroesophageal Reflux  She  reports new onset  Heart beating fast and loud at night  and burping spells in last 2 1/2 weeks.  No sweating, no dizziness, no chest pain. She has tried avoiding alcohol, GERD triggers in last few months.  The first time this started was after she had COVID in 03/2021. Happened intermittently during illness but not until 04/2020.  Now is happening at night 2 times a week .. wakes at night... lasts 40 min of heart racing... during the episodes she is constantly burping. Next morning always throwing up.  Last burping constantly after eating.  Has some upper abdominal pain, central chest pain... relieve with emesis.   She  Has not tried any medications.   No heartburn on  Protonix 40 mg daily... has never had endoscopy before.   Brother recently dx with pancreatic cancer.  She is overdue for DM follow up and lab evaluation. Due for cholesterol check on atovastatin.  Hypertension:   Borderline control today on losartan HCTZ.  BP Readings from Last 3 Encounters:  07/29/20 130/90  04/08/20 121/90  10/12/19 110/80  Using medication without problems or lightheadedness: none Chest pain with exertion: none Edema: none Short of breath: none Average home BPs: Other issues:         Relevant past medical, surgical, family and social history reviewed and updated as indicated. Interim medical history since our last visit reviewed. Allergies and medications reviewed and updated. Outpatient Medications  Prior to Visit  Medication Sig Dispense Refill  . acetaminophen (TYLENOL) 500 MG tablet Take 1,000 mg by mouth 2 (two) times daily as needed for moderate pain.    Marland Kitchen atorvastatin (LIPITOR) 40 MG tablet Take 1 tablet (40 mg total) by mouth daily. 30 tablet 5  . Blood Glucose Monitoring Suppl (ONE TOUCH ULTRA SYSTEM KIT) w/Device KIT Use to check blood sugar one to two times daily.  Dx: E11.9 1 each 0  . cyclobenzaprine (FLEXERIL) 5 MG tablet Take 5 mg by mouth 3 (three) times daily as needed for muscle spasms.    Marland Kitchen glucose blood (ONE TOUCH ULTRA TEST) test strip Use to check blood sugar one to two times daily.  Dx: E11.9 200 each 3  . insulin glargine (LANTUS SOLOSTAR) 100 UNIT/ML Solostar Pen Inject 10 Units into the skin daily. 3 mL 11  . Insulin Pen Needle 31G X 8 MM MISC Use to inject Victoza daily.  Dx: E11.65 90 each 3  . liraglutide (VICTOZA) 18 MG/3ML SOPN Inject 0.3 mLs (1.8 mg total) into the skin daily. 3 pen 11  . losartan-hydrochlorothiazide (HYZAAR) 50-12.5 MG tablet Take 1 tablet by mouth daily. 90 tablet 1  . metFORMIN (GLUCOPHAGE) 1000 MG tablet TAKE 1 TABLET BY MOUTH TWO TIMES DAILY WITH A MEAL 180 tablet 1  . Multiple Vitamin (MULTIVITAMIN WITH MINERALS) TABS tablet Take 2 tablets by mouth daily with lunch.    Marland Kitchen  ondansetron (ZOFRAN ODT) 4 MG disintegrating tablet Take 1 tablet (4 mg total) by mouth every 8 (eight) hours as needed for nausea or vomiting. 12 tablet 0  . OneTouch Delica Lancets 45X MISC Use to check blood sugar one to two times daily.  Dx: E11.9 200 each 3  . pantoprazole (PROTONIX) 40 MG tablet Take 1 tablet (40 mg total) by mouth daily. 90 tablet 0  . venlafaxine XR (EFFEXOR-XR) 37.5 MG 24 hr capsule Take 1 capsule by mouth daily 90 capsule 1   No facility-administered medications prior to visit.     Per HPI unless specifically indicated in ROS section below Review of Systems  Constitutional: Negative for fatigue and fever.  HENT: Negative for congestion.    Eyes: Negative for pain.  Respiratory: Negative for cough and shortness of breath.   Cardiovascular: Positive for palpitations. Negative for chest pain and leg swelling.  Gastrointestinal: Positive for nausea and vomiting. Negative for abdominal pain.  Genitourinary: Negative for dysuria and vaginal bleeding.  Musculoskeletal: Negative for back pain.  Neurological: Negative for syncope, light-headedness and headaches.  Psychiatric/Behavioral: Negative for dysphoric mood.   Objective:  BP 130/90   Pulse (!) 103   Temp 98.3 F (36.8 C) (Temporal)   Ht '5\' 6"'  (1.676 m)   Wt 156 lb 8 oz (71 kg)   LMP 10/12/2017   SpO2 96%   BMI 25.26 kg/m   Wt Readings from Last 3 Encounters:  07/29/20 156 lb 8 oz (71 kg)  04/04/20 152 lb (68.9 kg)  10/12/19 148 lb (67.1 kg)      Physical Exam Constitutional:      General: She is not in acute distress.    Appearance: Normal appearance. She is well-developed. She is not ill-appearing or toxic-appearing.  HENT:     Head: Normocephalic.     Right Ear: Hearing, tympanic membrane, ear canal and external ear normal. Tympanic membrane is not erythematous, retracted or bulging.     Left Ear: Hearing, tympanic membrane, ear canal and external ear normal. Tympanic membrane is not erythematous, retracted or bulging.     Nose: No mucosal edema or rhinorrhea.     Right Sinus: No maxillary sinus tenderness or frontal sinus tenderness.     Left Sinus: No maxillary sinus tenderness or frontal sinus tenderness.     Mouth/Throat:     Pharynx: Uvula midline.  Eyes:     General: Lids are normal. Lids are everted, no foreign bodies appreciated.     Conjunctiva/sclera: Conjunctivae normal.     Pupils: Pupils are equal, round, and reactive to light.  Neck:     Thyroid: No thyroid mass or thyromegaly.     Vascular: No carotid bruit.     Trachea: Trachea normal.  Cardiovascular:     Rate and Rhythm: Normal rate and regular rhythm.     Pulses: Normal pulses.      Heart sounds: Normal heart sounds, S1 normal and S2 normal. No murmur heard. No friction rub. No gallop.   Pulmonary:     Effort: Pulmonary effort is normal. No tachypnea or respiratory distress.     Breath sounds: Normal breath sounds. No decreased breath sounds, wheezing, rhonchi or rales.  Abdominal:     General: Bowel sounds are normal.     Palpations: Abdomen is soft.     Tenderness: There is abdominal tenderness in the epigastric area.  Musculoskeletal:     Cervical back: Normal range of motion and neck supple.  Skin:  General: Skin is warm and dry.     Findings: No rash.  Neurological:     Mental Status: She is alert.  Psychiatric:        Mood and Affect: Mood is not anxious or depressed.        Speech: Speech normal.        Behavior: Behavior normal. Behavior is cooperative.        Thought Content: Thought content normal.        Judgment: Judgment normal.       Results for orders placed or performed in visit on 11/09/19  Lipid panel  Result Value Ref Range   Cholesterol 249 (H) 0 - 200 mg/dL   Triglycerides (H) 0.0 - 149.0 mg/dL    449.0 Triglyceride is over 400; calculations on Lipids are invalid.   HDL 42.50 >39.00 mg/dL   Total CHOL/HDL Ratio 6   LDL cholesterol, direct  Result Value Ref Range   Direct LDL 133.0 mg/dL    This visit occurred during the SARS-CoV-2 public health emergency.  Safety protocols were in place, including screening questions prior to the visit, additional usage of staff PPE, and extensive cleaning of exam room while observing appropriate contact time as indicated for disinfecting solutions.  COVID 19 screen:  No recent travel or known exposure to COVID19 The patient denies respiratory symptoms of COVID 19 at this time. The importance of social distancing was discussed today.   Assessment and Pla    Problem List Items Addressed This Visit    Epigastric pain    Eval with labs and likely [place] vs CT.  Despite 40 mg daily pantoprazole.   Can use zofran prn, avoid acid triggers.  Will likely need referral to GI for endoscopy.      Relevant Orders   Hemoglobin A1c (Completed)   Lipid panel (Completed)   Comprehensive metabolic panel (Completed)   Lipase (Completed)   Family history of pancreatic cancer    New family history .Marland Kitchen brother with metastatic pancreatic cancer.      Hypertriglyceridemia    Chronically poor controlled despite high dose statin. Likely poor control due to poorly controlled DM. Due for re-eval.      Relevant Medications   losartan-hydrochlorothiazide (HYZAAR) 50-12.5 MG tablet   atorvastatin (LIPITOR) 40 MG tablet   Major depressive disorder, single episode, mild (HCC)   Relevant Medications   hydrOXYzine (ATARAX/VISTARIL) 10 MG tablet   Palpitations    EKG: normal EKG, normal sinus rhythm. Unchanged from previous EKGs.  ? If due to recent increase in stress from brother's diagnosis or is secondary to GI symptoms.        Relevant Orders   EKG 12-Lead (Completed)   Poorly controlled type 2 diabetes mellitus ([place])    Due for re-eval.      Relevant Medications   losartan-hydrochlorothiazide (HYZAAR) 50-12.5 MG tablet   atorvastatin (LIPITOR) 40 MG tablet   Situational anxiety - Primary    Can use hydroxyzine prn to see if helps with symptoms.      Relevant Medications   hydrOXYzine (ATARAX/VISTARIL) 10 MG tablet     Meds ordered this encounter  Medications  . losartan-hydrochlorothiazide (HYZAAR) 50-12.5 MG tablet    Sig: Take 1 tablet by mouth daily.    Dispense:  90 tablet    Refill:  1  . ondansetron (ZOFRAN ODT) 4 MG disintegrating tablet    Sig: Take 1 tablet (4 mg total) by mouth every 8 (eight) hours as needed for  nausea or vomiting.    Dispense:  12 tablet    Refill:  0  . atorvastatin (LIPITOR) 40 MG tablet    Sig: Take 1 tablet (40 mg total) by mouth daily.    Dispense:  90 tablet    Refill:  3  . hydrOXYzine (ATARAX/VISTARIL) 10 MG tablet    Sig: Take 1  tablet (10 mg total) by mouth 3 (three) times daily as needed.    Dispense:  30 tablet    Refill:  0    Orders Placed This Encounter  Procedures  . Hemoglobin A1c  . Lipid panel  . Comprehensive metabolic panel  . Lipase  . LDL cholesterol, direct  . EKG 12-Lead    Eliezer Lofts, MD

## 2020-07-30 ENCOUNTER — Encounter: Payer: Self-pay | Admitting: Family Medicine

## 2020-07-30 DIAGNOSIS — R002 Palpitations: Secondary | ICD-10-CM | POA: Insufficient documentation

## 2020-07-30 NOTE — Assessment & Plan Note (Signed)
Can use hydroxyzine prn to see if helps with symptoms.

## 2020-07-30 NOTE — Assessment & Plan Note (Signed)
Chronically poor controlled despite high dose statin. Likely poor control due to poorly controlled DM. Due for re-eval.

## 2020-07-30 NOTE — Assessment & Plan Note (Signed)
Due for re-eval. 

## 2020-07-30 NOTE — Assessment & Plan Note (Signed)
New family history .Marland Kitchen brother with metastatic pancreatic cancer.

## 2020-07-30 NOTE — Assessment & Plan Note (Signed)
Eval with labs and likely [place] vs CT.  Despite 40 mg daily pantoprazole.  Can use zofran prn, avoid acid triggers.  Will likely need referral to GI for endoscopy.

## 2020-07-30 NOTE — Assessment & Plan Note (Signed)
EKG: normal EKG, normal sinus rhythm. Unchanged from previous EKGs.  ? If due to recent increase in stress from brother's diagnosis or is secondary to GI symptoms.

## 2020-08-06 ENCOUNTER — Telehealth: Payer: Self-pay | Admitting: *Deleted

## 2020-08-06 NOTE — Telephone Encounter (Signed)
Called patient to reschedule appointment. Provider will not be in office at 3pm on 4/25. Patient appointment can be moved to 1:15pm that day or resheduled for 08/19/2020. Patient did not answer and Practice Admin/RN LVM for patient to return call.

## 2020-08-07 ENCOUNTER — Telehealth: Payer: Self-pay | Admitting: *Deleted

## 2020-08-07 NOTE — Telephone Encounter (Signed)
Received fax from [facility] requesting PA for Victoza.  PA completed on CoverMyMeds and sent for review. Can take up to 72 hours for a decision.

## 2020-08-13 MED ORDER — VENLAFAXINE HCL ER 37.5 MG PO CP24: ORAL_CAPSULE | ORAL | 1 refills | Status: DC

## 2020-08-13 MED ORDER — VICTOZA 18 MG/3ML ~~LOC~~ SOPN: 1.8000 mg | PEN_INJECTOR | Freq: Every day | SUBCUTANEOUS | 3 refills | Status: DC

## 2020-08-18 ENCOUNTER — Other Ambulatory Visit: Payer: Self-pay

## 2020-08-18 ENCOUNTER — Ambulatory Visit: Payer: No Typology Code available for payment source | Admitting: Gastroenterology

## 2020-08-18 ENCOUNTER — Encounter: Payer: Self-pay | Admitting: Gastroenterology

## 2020-08-18 VITALS — BP 127/90 | HR 93 | Temp 98.1°F | Wt 154.6 lb

## 2020-08-18 DIAGNOSIS — K824 Cholesterolosis of gallbladder: Secondary | ICD-10-CM

## 2020-08-18 DIAGNOSIS — R112 Nausea with vomiting, unspecified: Secondary | ICD-10-CM | POA: Diagnosis not present

## 2020-08-18 DIAGNOSIS — R1013 Epigastric pain: Secondary | ICD-10-CM

## 2020-08-18 NOTE — Progress Notes (Signed)
Jonathon Bellows MD, MRCP(U.K) [place]  [place], [place] [address]  Main: [phone]  Fax: [phone]   Gastroenterology Consultation  Referring Provider:     Jinny Sanders, MD Primary Care Physician:  Jinny Sanders, MD Primary Gastroenterologist:  Dr. Jonathon Bellows  Reason for Consultation:     Burping, epigastric pain, GERD        HPI:   Alyssa Warren is a 50 y.o. y/o female referred for consultation & management  by Dr. Jinny Sanders, MD.    April 2021: Right upper quadrant ultrasound showed possible polyp or gallbladder sludge.  Was recommended repeat in 1 years time which she is due for in May 2022 which has been scheduled.  07/29/2020: CMP normal except elevated glucose HbA1c 10.5.  Elevated triglyceride levels over thousand in the past.  For the past 1 year she has had symptoms of left upper quadrant pain, nausea, belching all smelling gas, early morning nausea vomiting when she throws up contents from the meals from the previous day.  Lost 20 to 30 pounds weight.  Commenced on Victoza some months back.  Early satiety present.  Worsening of acid reflux control noted.  Takes Protonix close to lunchtime.  No other complaints.   Past Medical History:  Diagnosis Date  . Allergy   . Arthritis    LEFT KNEE  . Diabetes mellitus without complication ([place])   . GERD (gastroesophageal reflux disease)    OCC  . History of kidney stones    H/O  . Hypertension   . MVP (mitral valve prolapse)   . PCOS (polycystic ovarian syndrome)     Past Surgical History:  Procedure Laterality Date  . BREAST BIOPSY Right 02/2012   x2. benign  . cyst removed from ovary    . CYSTOSCOPY  11/01/2017   Procedure: CYSTOSCOPY;  Surgeon: Gae Dry, MD;  Location: [facility] OR[facility];  Service: Gynecology;;  . Murrell Redden & CURRETTAGE/HYSTROSCOPY WITH NOVASURE ABLATION N/A 12/23/2016   Procedure: DILATATION & CURETTAGE/HYSTEROSCOPY ENDOMETRIAL ABLATION-MINERVA;  Surgeon: Gae Dry, MD;  Location: [facility] ORS;  Service: Gynecology;  Laterality: N/A;  . KNEE SURGERY Left   . LABIOPLASTY N/A 07/12/2017   Procedure: EXCISION OF LABIAL CYST;  Surgeon: Gae Dry, MD;  Location: [facility] OR[facility];  Service: Gynecology;  Laterality: N/A;  . LAPAROSCOPIC HYSTERECTOMY Bilateral 11/01/2017   Procedure: HYSTERECTOMY TOTAL LAPAROSCOPIC BILATERAL SALPINGECTOMY, RIGHT OVARIAN CYSTECTOMY;  Surgeon: Gae Dry, MD;  Location: [facility] OR[facility];  Service: Gynecology;  Laterality: Bilateral;  . LAPAROSCOPIC SALPINGO OOPHERECTOMY Left 12/23/2016   Procedure: LAPAROSCOPIC SALPINGO OOPHORECTOMY;  Surgeon: Gae Dry, MD;  Location: [facility] OR[facility];  Service: Gynecology;  Laterality: Left;  . plantar fasciitis release Bilateral   . TUBAL LIGATION      Prior to Admission medications   Medication Sig Start Date End Date Taking? Authorizing Provider  acetaminophen (TYLENOL) 500 MG tablet Take 1,000 mg by mouth 2 (two) times daily as needed for moderate pain.    [provider]  atorvastatin (LIPITOR) 40 MG tablet Take 1 tablet (40 mg total) by mouth daily. 07/29/20 10/27/20  Bedsole, Amy E, MD  Blood Glucose Monitoring Suppl (ONE TOUCH ULTRA SYSTEM KIT) w/Device KIT Use to check blood sugar one to two times daily.  Dx: E11.9 08/13/16   Jinny Sanders, MD  cyclobenzaprine (FLEXERIL) 5 MG tablet Take 5 mg by mouth 3 (three) times daily as needed for muscle spasms.    [provider]  glucose blood (ONE TOUCH ULTRA TEST) test strip Use to check blood sugar one to two times daily.  Dx: E11.9 08/07/18   Jinny Sanders, MD  hydrOXYzine (ATARAX/VISTARIL) 10 MG tablet Take 1 tablet (10 mg total) by mouth 3 (three) times daily as needed. 07/29/20   Bedsole, Amy E, MD  insulin glargine (LANTUS SOLOSTAR) 100 UNIT/ML Solostar Pen Inject 10 Units into the skin daily. 10/12/19   Bedsole, Amy E, MD  Insulin Pen Needle 31G X 8 MM MISC Use to inject Victoza daily.  Dx: E11.65 10/12/19   Bedsole, Amy E, MD   liraglutide (VICTOZA) 18 MG/3ML SOPN Inject 1.8 mg into the skin daily. 08/13/20   Bedsole, Amy E, MD  losartan-hydrochlorothiazide (HYZAAR) 50-12.5 MG tablet Take 1 tablet by mouth daily. 07/29/20   Bedsole, Amy E, MD  metFORMIN (GLUCOPHAGE) 1000 MG tablet TAKE 1 TABLET BY MOUTH TWO TIMES DAILY WITH A MEAL 11/29/19   Bedsole, Amy E, MD  Multiple Vitamin (MULTIVITAMIN WITH MINERALS) TABS tablet Take 2 tablets by mouth daily with lunch.    [provider]  ondansetron (ZOFRAN ODT) 4 MG disintegrating tablet Take 1 tablet (4 mg total) by mouth every 8 (eight) hours as needed for nausea or vomiting. 07/29/20   Jinny Sanders, MD  OneTouch Delica Lancets 11B MISC Use to check blood sugar one to two times daily.  Dx: E11.9 08/07/18   Bedsole, Amy E, MD  pantoprazole (PROTONIX) 40 MG tablet Take 1 tablet (40 mg total) by mouth daily. 06/26/20   Jinny Sanders, MD  venlafaxine XR (EFFEXOR-XR) 37.5 MG 24 hr capsule Take 1 capsule by mouth daily 08/13/20   Jinny Sanders, MD    Family History  Problem Relation Age of Onset  . Breast cancer Maternal Aunt        great Aunts   . Breast cancer Paternal Aunt   . Heart attack Brother      Social History   Tobacco Use  . Smoking status: Current Some Day Smoker    Packs/day: 0.50    Years: 15.00    Pack years: 7.50    Types: Cigarettes    Last attempt to quit: 12/19/2016    Years since quitting: 3.6  . Smokeless tobacco: Never Used  . Tobacco comment: a few a day  Vaping Use  . Vaping Use: Never used  Substance Use Topics  . Alcohol use: No    Alcohol/week: 0.0 standard drinks  . Drug use: No    Allergies as of 08/18/2020 - Review Complete 04/08/2020  Allergen Reaction Noted  . Penicillins Hives and Itching   . Keflex [cephalexin] Other (See Comments) 12/05/2014  . Methyldopa Other (See Comments) 06/16/2006    Review of Systems:    All systems reviewed and negative except where noted in HPI.   Physical Exam:  LMP 10/12/2017  Patient's  last menstrual period was 10/12/2017. Psych:  Alert and cooperative. Normal mood and affect. General:   Alert,  Well-developed, well-nourished, pleasant and cooperative in NAD Head:  Normocephalic and atraumatic. Eyes:  Sclera clear, no icterus.   Conjunctiva pink. Ears:  Normal auditory acuity. Lungs:  Respirations even and unlabored.  Clear throughout to auscultation.   No wheezes, crackles, or rhonchi. No acute distress. Heart:  Regular rate and rhythm; no murmurs, clicks, rubs, or gallops. Abdomen:  Normal bowel sounds.  No bruits.  Soft, non-tender and non-distended without masses, hepatosplenomegaly or hernias noted.  No guarding or  rebound tenderness.   . Neurologic:  Alert and oriented x3;  grossly normal neurologically. Psych:  Alert and cooperative. Normal mood and affect.  Imaging Studies: No results found.  Assessment and Plan:   Alyssa Warren is a 50 y.o. y/o female has been referred for dyspeptic symptoms which are all suggestive of gastroparesis related to impaired glycemic control with an A1c of over 10.  In addition Victoza can cause delayed gastric emptying and together may exacerbate her symptoms.  Plan   1.  start taking Protonix with the in the morning and empty stomach.  Presently taking it after breakfast.  2.  Commence on gastroparesis diet patient information provided and explained in detail  3.  Small meals more often rather than large meals at a time.  Decrease fat intake in diet, low fiber diet.  Informed that gastroparesis does not usually affect liquids.  4.  Gastric emptying study and EGD to rule out gastric outlet obstruction.  5.  Informed to tighten glycemic control which would help improve gastric emptying and in addition provide her her information about Victoza which can cause delayed gastric emptying and all the symptoms that she mentions.  May be an option to consider holding it for a few weeks to see if it makes her feel significantly  better.  6.  Gallbladder polyp: Schedule for right upper quadrant ultrasound for surveillance  I have discussed alternative options, risks & benefits,  which include, but are not limited to, bleeding, infection, perforation,respiratory complication & drug reaction.  The patient agrees with this plan & written consent will be obtained.     Follow up in 3 months  Dr Jonathon Bellows MD,MRCP(U.K)

## 2020-08-20 LAB — H. PYLORI BREATH TEST: H pylori Breath Test: NEGATIVE

## 2020-08-20 LAB — TSH: TSH: 1.19 u[IU]/mL (ref 0.450–4.500)

## 2020-08-26 ENCOUNTER — Telehealth: Payer: Self-pay | Admitting: Gastroenterology

## 2020-08-26 ENCOUNTER — Encounter: Payer: Self-pay | Admitting: Gastroenterology

## 2020-08-26 NOTE — Telephone Encounter (Signed)
Confirm Notification/Prior Authorization  Thank you for your online Notification/Prior Authorization submission.  The notification/prior authorization case information was transmitted on 08/26/2020 at 12:47 PM CDT. The notification/prior authorization reference number is [id number]. Please print this page for your records.  The reference number above acknowledges receipt of your notification or prior authorization request. Please write this number down and refer to it for future inquiries. Coverage and payment for an item or service is governed by the member's benefit plan document, and, if applicable, the provider's participation agreement with the Health Plan.  Please note that if you wish to cancel these services at any time, or if you have any questions, please contact us by calling the number on the back of the member's ID card. Thank you. Expand all   Collapse all ATTACH CLINICAL DOCUMENTATION   Additional information is required. To facilitate the review of your request and prevent delays in the coverage determination process, we recommend that you attach the following information. Please use the 'Select files' link to upload the requested information.   Procedure Code  20947  ESOPHAGOGASTRODUODENOSCOPY TRANSORAL DIAGNOSTIC  ?If the location being requested is an outpatient hospital provide medical notes documenting all of the following: 1. History 2. Physical examination including patient weight and co-morbidities 3. Surgical plan 4. Physician privileging information related to the need for the use of the hospital outpatient department 5. American Society of Anesthesiologists (ASA) score, as applicable 6. Specific criteria (see coverage rationale) that qualifies the individual for the site of service requested   You will be notified of the coverage determination for this request when the review is complete. You can also obtain status by returning to this site and selecting the  Notification Status feature.  DATE FILE NAME STATUS  08/26/2020 [email or web address] In Progress   Select files  The maximum file size that can be attached is 25MB, but you can attach multiple files. Acceptable file types are bmp, doc, docx, gif, jpg, jpeg, pdf, png, tiff and txt. Filename can contain only: a-z, A-Z, 0-9, space, underscore and dash/hyphen.  Please refer to Medical Records Requirements for Pre-Service to determine what specific clinical documentation is required to be attached to this request.   PATIENT DETAILS PATIENT NAME RELATIONSHIP VERBAL LANGUAGE [employer] Employee - A future timeline may be available for this member. For future coverage please call the telephone number located on the back of the [facility] Medical ID card. MEMBER NUMBER EFFECTIVE DATE WRITTEN LANGUAGE PREFERENCE [id number] 04/26/2020 - GROUP NUMBER TERMINATION DATE [id number] 04/25/9998 PRODUCT INSURANCE TYPE POS Commercial ADMITTING/ATTENDING PHYSICIAN DETAILS  NAME ADDRESS Jonathon Bellows [place], [place], [place]  [address] TAX ID STATUS [id number] In-Network [facility]AC[facility] DETAILS [facility] [place], [place]  [address] FACILITY ID NUMBER* STATUS [id number] In-Network SERVICE DETAILS PLACE OF SERVICE  SERVICE DETAILS  Outpatient Facility Surgical  FACILITY SERVICE DATES DETAILS START DATE*: END DATE: SERVICE DESCRIPTION*  09/01/2020 11/30/2020 Scheduled DIAGNOSIS DETAILS Please list the primary diagnosis code along with any other secondary codes. You may enter up to 10 diagnosis codes. If you do not list the primary diagnosis code specific to the service you are requesting, then you may receive a message in error that prior authorization is not required.  CODE DESCRIPTION  Primary R10.13 EPIGASTRIC PAIN   New K31.84 GASTROPARESIS   PROCEDURE DETAILS  Please list the primary procedure code  along with any other secondary codes. Primary procedure code is required. You  many enter up to 14 procedure codes.  CODE DESCRIPTION SERVICING PROVIDER NAME, TAX ID, STATUS, ADDRESS    Primary [phone] Esophagogastroduodenoscopy, flexible, tr more   Jonathon Bellows,  [id number], [place],  [place],  [place], [place]  [address] REVIEW PRIORITY Expedited Review  By checking this box and indicating that you are requesting an Expedited Review, you acknowledge that you have read and are adhering to the regulations pertaining to requesting an Expedited Review. Medicare: 42 CFR Section 422.570 Medicaid: CFR Section 438.210 All other membership: [employer] and [employer] 29 CFR [phone] AND 29 cfr 2560.503 INITIAL CONTACT DETAILS (Person submitting the notification/prior authorization) NAME* PHONE NUMBER + EXT.Joylene Igo NUMBER Kae Heller [phone] FOLLOW-UP CONTACT DETAILS It is important that you provide the contact information of the individual who can provide additional clinical information and assist with discharge planning activities, if applicable.  NAME* ROLE* DEPARTMENT [facility] Specialist - PROVIDER PHONE NUMBER + EXT.Joylene Igo NUMBER*  EMAIL [phone] [place] NUMBER  MEMBER PHONE NUMBER + EXT.

## 2020-08-26 NOTE — Telephone Encounter (Signed)
error 

## 2020-08-27 ENCOUNTER — Encounter: Payer: Self-pay | Admitting: Gastroenterology

## 2020-08-29 ENCOUNTER — Encounter: Payer: Self-pay | Admitting: Gastroenterology

## 2020-09-01 ENCOUNTER — Ambulatory Visit: Payer: No Typology Code available for payment source | Admitting: Anesthesiology

## 2020-09-01 ENCOUNTER — Encounter
Admission: RE | Disposition: A | Discharge status: Home / Self Care | Payer: Self-pay | Source: Home / Self Care | Attending: Gastroenterology

## 2020-09-01 ENCOUNTER — Other Ambulatory Visit: Payer: Self-pay

## 2020-09-01 ENCOUNTER — Encounter: Payer: Self-pay | Admitting: Gastroenterology

## 2020-09-01 ENCOUNTER — Ambulatory Visit
Admission: RE | Admit: 2020-09-01 | Discharge: 2020-09-01 | Disposition: A | Discharge status: Home / Self Care | Payer: No Typology Code available for payment source | Attending: Gastroenterology | Admitting: Gastroenterology

## 2020-09-01 DIAGNOSIS — Z794 Long term (current) use of insulin: Secondary | ICD-10-CM | POA: Insufficient documentation

## 2020-09-01 DIAGNOSIS — Z880 Allergy status to penicillin: Secondary | ICD-10-CM | POA: Insufficient documentation

## 2020-09-01 DIAGNOSIS — Z79899 Other long term (current) drug therapy: Secondary | ICD-10-CM | POA: Insufficient documentation

## 2020-09-01 DIAGNOSIS — Z8249 Family history of ischemic heart disease and other diseases of the circulatory system: Secondary | ICD-10-CM | POA: Insufficient documentation

## 2020-09-01 DIAGNOSIS — F17210 Nicotine dependence, cigarettes, uncomplicated: Secondary | ICD-10-CM | POA: Insufficient documentation

## 2020-09-01 DIAGNOSIS — K219 Gastro-esophageal reflux disease without esophagitis: Secondary | ICD-10-CM | POA: Insufficient documentation

## 2020-09-01 DIAGNOSIS — Z888 Allergy status to other drugs, medicaments and biological substances status: Secondary | ICD-10-CM | POA: Insufficient documentation

## 2020-09-01 DIAGNOSIS — R112 Nausea with vomiting, unspecified: Secondary | ICD-10-CM | POA: Diagnosis present

## 2020-09-01 DIAGNOSIS — Z881 Allergy status to other antibiotic agents status: Secondary | ICD-10-CM | POA: Diagnosis not present

## 2020-09-01 DIAGNOSIS — E119 Type 2 diabetes mellitus without complications: Secondary | ICD-10-CM | POA: Diagnosis not present

## 2020-09-01 DIAGNOSIS — Z803 Family history of malignant neoplasm of breast: Secondary | ICD-10-CM | POA: Diagnosis not present

## 2020-09-01 DIAGNOSIS — R1013 Epigastric pain: Secondary | ICD-10-CM

## 2020-09-01 HISTORY — PX: ESOPHAGOGASTRODUODENOSCOPY (EGD) WITH PROPOFOL: SHX5813

## 2020-09-01 LAB — GLUCOSE, CAPILLARY
Glucose-Capillary: 294 mg/dL — ABNORMAL HIGH (ref 70–99)
Glucose-Capillary: 332 mg/dL — ABNORMAL HIGH (ref 70–99)
Glucose-Capillary: 251 mg/dL — ABNORMAL HIGH (ref 70–99)

## 2020-09-01 SURGERY — ESOPHAGOGASTRODUODENOSCOPY (EGD) WITH PROPOFOL
Anesthesia: General

## 2020-09-01 MED ORDER — GLYCOPYRROLATE 0.2 MG/ML IJ SOLN
INTRAMUSCULAR | Status: AC
  Filled 2020-09-01: qty 1

## 2020-09-01 MED ORDER — EPHEDRINE 5 MG/ML INJ
INTRAVENOUS | Status: AC
  Filled 2020-09-01: qty 10

## 2020-09-01 MED ORDER — PROPOFOL 500 MG/50ML IV EMUL
INTRAVENOUS | Status: AC
  Filled 2020-09-01: qty 50

## 2020-09-01 MED ORDER — INSULIN ASPART 100 UNIT/ML IJ SOLN
INTRAMUSCULAR | Status: AC
  Filled 2020-09-01: qty 1

## 2020-09-01 MED ORDER — SODIUM CHLORIDE 0.9 % IV SOLN: INTRAVENOUS | Status: DC

## 2020-09-01 MED ORDER — PROPOFOL 10 MG/ML IV BOLUS
INTRAVENOUS | Status: DC | PRN
  Administered 2020-09-01 (×2): 40 mg via INTRAVENOUS
  Administered 2020-09-01: 80 mg via INTRAVENOUS
  Administered 2020-09-01: 20 mg via INTRAVENOUS

## 2020-09-01 MED ORDER — INSULIN ASPART 100 UNIT/ML IJ SOLN
10.0000 [IU] | Freq: Once | INTRAMUSCULAR | Status: AC
  Administered 2020-09-01: 10 [IU] via SUBCUTANEOUS

## 2020-09-01 MED ORDER — LIDOCAINE HCL (PF) 2 % IJ SOLN
INTRAMUSCULAR | Status: AC
  Filled 2020-09-01: qty 2

## 2020-09-01 MED ORDER — LIDOCAINE HCL (PF) 2 % IJ SOLN
INTRAMUSCULAR | Status: AC
  Filled 2020-09-01: qty 4

## 2020-09-01 MED ORDER — LIDOCAINE HCL (CARDIAC) PF 100 MG/5ML IV SOSY
PREFILLED_SYRINGE | INTRAVENOUS | Status: DC | PRN
  Administered 2020-09-01: 60 mg via INTRAVENOUS

## 2020-09-01 MED ORDER — GLYCOPYRROLATE 0.2 MG/ML IJ SOLN
INTRAMUSCULAR | Status: DC | PRN
  Administered 2020-09-01: .2 mg via INTRAVENOUS

## 2020-09-01 NOTE — H&P (Signed)
Jonathon Bellows, MD [address] [place], [place], [place], [address] [address], [place], [place], [place], [address] Phone: [phone]  Fax: [phone]  Primary Care Physician:  Jinny Sanders, MD   Pre-Procedure History & Physical: HPI:  Alyssa Warren is a 50 y.o. female is here for an endoscopy    Past Medical History:  Diagnosis Date  . Allergy   . Arthritis    LEFT KNEE  . Diabetes mellitus without complication (Man)   . GERD (gastroesophageal reflux disease)    OCC  . History of kidney stones    H/O  . Hypertension   . MVP (mitral valve prolapse)   . PCOS (polycystic ovarian syndrome)     Past Surgical History:  Procedure Laterality Date  . BREAST BIOPSY Right 02/2012   x2. benign  . cyst removed from ovary    . CYSTOSCOPY  11/01/2017   Procedure: CYSTOSCOPY;  Surgeon: Gae Dry, MD;  Location: [facility] ORS;  Service: Gynecology;;  . Murrell Redden & CURRETTAGE/HYSTROSCOPY WITH NOVASURE ABLATION N/A 12/23/2016   Procedure: DILATATION & CURETTAGE/HYSTEROSCOPY ENDOMETRIAL ABLATION-MINERVA;  Surgeon: Gae Dry, MD;  Location: [facility] ORS;  Service: Gynecology;  Laterality: N/A;  . KNEE SURGERY Left   . LABIOPLASTY N/A 07/12/2017   Procedure: EXCISION OF LABIAL CYST;  Surgeon: Gae Dry, MD;  Location: [facility] ORS;  Service: Gynecology;  Laterality: N/A;  . LAPAROSCOPIC HYSTERECTOMY Bilateral 11/01/2017   Procedure: HYSTERECTOMY TOTAL LAPAROSCOPIC BILATERAL SALPINGECTOMY, RIGHT OVARIAN CYSTECTOMY;  Surgeon: Gae Dry, MD;  Location: [facility] ORS;  Service: Gynecology;  Laterality: Bilateral;  . LAPAROSCOPIC SALPINGO OOPHERECTOMY Left 12/23/2016   Procedure: LAPAROSCOPIC SALPINGO OOPHORECTOMY;  Surgeon: Gae Dry, MD;  Location: [facility] ORS;  Service: Gynecology;  Laterality: Left;  . plantar fasciitis release Bilateral   . ROTATOR CUFF REPAIR Right   . TUBAL LIGATION      Prior to Admission medications   Medication Sig Start Date End  Date Taking? Authorizing Provider  atorvastatin (LIPITOR) 40 MG tablet Take 1 tablet (40 mg total) by mouth daily. 07/29/20 10/27/20 Yes Bedsole, Amy E, MD  insulin glargine (LANTUS SOLOSTAR) 100 UNIT/ML Solostar Pen Inject 10 Units into the skin daily. 10/12/19  Yes Bedsole, Amy E, MD  losartan-hydrochlorothiazide (HYZAAR) 50-12.5 MG tablet Take 1 tablet by mouth daily. 07/29/20  Yes Bedsole, Amy E, MD  metFORMIN (GLUCOPHAGE) 1000 MG tablet TAKE 1 TABLET BY MOUTH TWO TIMES DAILY WITH A MEAL 11/29/19  Yes Bedsole, Amy E, MD  Multiple Vitamin (MULTIVITAMIN WITH MINERALS) TABS tablet Take 2 tablets by mouth daily with lunch.   Yes [provider]  pantoprazole (PROTONIX) 40 MG tablet Take 1 tablet (40 mg total) by mouth daily. 06/26/20  Yes Bedsole, Amy E, MD  venlafaxine XR (EFFEXOR-XR) 37.5 MG 24 hr capsule Take 1 capsule by mouth daily 08/13/20  Yes Bedsole, Amy E, MD  acetaminophen (TYLENOL) 500 MG tablet Take 1,000 mg by mouth 2 (two) times daily as needed for moderate pain.    [provider]  Blood Glucose Monitoring Suppl (ONE TOUCH ULTRA SYSTEM KIT) w/Device KIT Use to check blood sugar one to two times daily.  Dx: E11.9 08/13/16   Jinny Sanders, MD  cyclobenzaprine (FLEXERIL) 5 MG tablet Take 5 mg by mouth 3 (three) times daily as needed for muscle spasms.    [provider]  glucose blood (ONE TOUCH ULTRA TEST) test strip Use to check blood sugar one to two times  daily.  Dx: E11.9 08/07/18   Jinny Sanders, MD  hydrOXYzine (ATARAX/VISTARIL) 10 MG tablet Take 1 tablet (10 mg total) by mouth 3 (three) times daily as needed. 07/29/20   Bedsole, Amy E, MD  Insulin Pen Needle 31G X 8 MM MISC Use to inject Victoza daily.  Dx: E11.65 10/12/19   Bedsole, Amy E, MD  liraglutide (VICTOZA) 18 MG/3ML SOPN Inject 1.8 mg into the skin daily. 08/13/20   Bedsole, Amy E, MD  ondansetron (ZOFRAN ODT) 4 MG disintegrating tablet Take 1 tablet (4 mg total) by mouth every 8 (eight) hours as needed for  nausea or vomiting. 07/29/20   Jinny Sanders, MD  OneTouch Delica Lancets 16X MISC Use to check blood sugar one to two times daily.  Dx: E11.9 08/07/18   Jinny Sanders, MD    Allergies as of 08/18/2020 - Review Complete 08/18/2020  Allergen Reaction Noted  . Penicillins Hives and Itching   . Keflex [cephalexin] Other (See Comments) 12/05/2014  . Methyldopa Other (See Comments) 06/16/2006    Family History  Problem Relation Age of Onset  . Breast cancer Maternal Aunt        great Aunts   . Breast cancer Paternal Aunt   . Heart attack Brother     Social History   Socioeconomic History  . Marital status: Divorced    Spouse name: Not on file  . Number of children: Not on file  . Years of education: Not on file  . Highest education level: Not on file  Occupational History  . Not on file  Tobacco Use  . Smoking status: Current Some Day Smoker    Packs/day: 0.50    Years: 15.00    Pack years: 7.50    Types: Cigarettes    Last attempt to quit: 12/19/2016    Years since quitting: 3.7  . Smokeless tobacco: Never Used  . Tobacco comment: a few a day  Vaping Use  . Vaping Use: Never used  Substance and Sexual Activity  . Alcohol use: No    Alcohol/week: 0.0 standard drinks  . Drug use: No  . Sexual activity: Never  Other Topics Concern  . Not on file  Social History Narrative  . Not on file   Social Determinants of Health   Financial Resource Strain: Not on file  Food Insecurity: Not on file  Transportation Needs: Not on file  Physical Activity: Not on file  Stress: Not on file  Social Connections: Not on file  Intimate Partner Violence: Not on file    Review of Systems: See HPI, otherwise negative ROS  Physical Exam: BP (!) 132/91   Pulse 93   Temp 98.2 F (36.8 C) (Oral)   Resp 20   Ht '5\' 6"'  (1.676 m)   Wt 69.4 kg   LMP 10/12/2017   SpO2 98%   BMI 24.69 kg/m  General:   Alert,  pleasant and cooperative in NAD Head:  Normocephalic and  atraumatic. Neck:  Supple; no masses or thyromegaly. Lungs:  Clear throughout to auscultation, normal respiratory effort.    Heart:  +S1, +S2, Regular rate and rhythm, No edema. Abdomen:  Soft, nontender and nondistended. Normal bowel sounds, without guarding, and without rebound.   Neurologic:  Alert and  oriented x4;  grossly normal neurologically.  Impression/Plan: Alyssa Warren is here for an endoscopy  to be performed for  evaluation of gastroparesis    Risks, benefits, limitations, and alternatives regarding endoscopy have been  reviewed with the patient.  Questions have been answered.  All parties agreeable.   Jonathon Bellows, MD  09/01/2020, 8:13 AM

## 2020-09-01 NOTE — Op Note (Signed)
[facility] Patient Name: Alyssa Warren Procedure Date: 09/01/2020 8:13 AM MRN: [id number] Account #: [id number] Date of Birth: 21-Mar-1971 Admit Type: Outpatient Age: 50 Room: [facility] ENDO ROOM 4 Gender: Female Note Status: Finalized Procedure:             Upper GI endoscopy Indications:           Suspected gastroparesis, Nausea with vomiting Providers:             Jonathon Bellows MD, MD Medicines:             Monitored Anesthesia Care Complications:         No immediate complications. Procedure:             Pre-Anesthesia Assessment:                        - Prior to the procedure, a History and Physical was                         performed, and patient medications, allergies and                         sensitivities were reviewed. The patient's tolerance                         of previous anesthesia was reviewed.                        - The risks and benefits of the procedure and the                         sedation options and risks were discussed with the                         patient. All questions were answered and informed                         consent was obtained.                        - ASA Grade Assessment: II - A patient with mild                         systemic disease.                        After obtaining informed consent, the endoscope was                         passed under direct vision. Throughout the procedure,                         the patient's blood pressure, pulse, and oxygen                         saturations were monitored continuously. The Endoscope                         was introduced through the mouth, and advanced to the  third part of duodenum. The upper GI endoscopy was                         accomplished with ease. The patient tolerated the                         procedure well. Findings:      The esophagus was normal.      The examined duodenum was normal.      The cardia and  gastric fundus were normal on retroflexion.      A medium amount of food (residue) was found on the greater curvature of       the stomach. Impression:            - Normal esophagus.                        - Normal examined duodenum.                        - A medium amount of food (residue) in the stomach.                        - No specimens collected. Recommendation:        - Discharge patient to home (with escort).                        - Resume previous diet.                        - Continue present medications.                        - Return to my office as previously scheduled. Procedure Code(s):     --- Professional ---                        [phone], Esophagogastroduodenoscopy, flexible,                         transoral; diagnostic, including collection of                         specimen(s) by brushing or washing, when performed                         (separate procedure) Diagnosis Code(s):     --- Professional ---                        R11.2, Nausea with vomiting, unspecified CPT copyright 2019 American Medical Association. All rights reserved. The codes documented in this report are preliminary and upon coder review may  be revised to meet current compliance requirements. Jonathon Bellows, MD Jonathon Bellows MD, MD 09/01/2020 8:28:40 AM This report has been signed electronically. Number of Addenda: 0 Note Initiated On: 09/01/2020 8:13 AM Estimated Blood Loss:  Estimated blood loss: none.      [facility]

## 2020-09-01 NOTE — Anesthesia Preprocedure Evaluation (Addendum)
Anesthesia Evaluation  Patient identified by MRN, date of birth, ID band Patient awake    Reviewed: Allergy & Precautions, H&P , NPO status , Patient's Chart, lab work & pertinent test results, reviewed documented beta blocker date and time   History of Anesthesia Complications (+) PONV and history of anesthetic complications  Airway Mallampati: II  TM Distance: >3 FB Neck ROM: full    Dental no notable dental hx. (+) Caps, Dental Advidsory Given, Teeth Intact   Pulmonary neg pulmonary ROS, Current Smoker and Patient abstained from smoking., former smoker,    Pulmonary exam normal        Cardiovascular Exercise Tolerance: Good hypertension, (-) angina(-) CAD, (-) Past MI, (-) Cardiac Stents and (-) CABG Normal cardiovascular exam(-) dysrhythmias + Valvular Problems/Murmurs AS      Neuro/Psych PSYCHIATRIC DISORDERS (Depression) Anxiety Depression  Neuromuscular disease    GI/Hepatic Neg liver ROS, GERD  Poorly Controlled,  Endo/Other  diabetes, Poorly Controlled, Type 1, Oral Hypoglycemic Agents, Insulin Dependent  Renal/GU negative Renal ROS  negative genitourinary   Musculoskeletal  (+) Arthritis , Osteoarthritis,    Abdominal Normal abdominal exam  (+)   Peds  Hematology negative hematology ROS (+)   Anesthesia Other Findings Past Medical History: No date: Allergy No date: Diabetes mellitus without complication (HCC) No date: Hypertension   Reproductive/Obstetrics negative OB ROS                            Anesthesia Physical  Anesthesia Plan  ASA: II  Anesthesia Plan: General   Post-op Pain Management:    Induction: Intravenous  PONV Risk Score and Plan: 4 or greater and Propofol infusion and TIVA  Airway Management Planned: Nasal Cannula  Additional Equipment:   Intra-op Plan:   Post-operative Plan: Extubation in OR  Informed Consent: I have reviewed the patients  History and Physical, chart, labs and discussed the procedure including the risks, benefits and alternatives for the proposed anesthesia with the patient or authorized representative who has indicated his/her understanding and acceptance.     Dental Advisory Given  Plan Discussed with: Anesthesiologist, CRNA and Surgeon  Anesthesia Plan Comments:        Anesthesia Quick Evaluation

## 2020-09-01 NOTE — Anesthesia Postprocedure Evaluation (Signed)
Anesthesia Post Note  Patient: Chontel Warning  Procedure(s) Performed: ESOPHAGOGASTRODUODENOSCOPY (EGD) WITH PROPOFOL (N/A )  Patient location during evaluation: Phase II Anesthesia Type: General Level of consciousness: awake and alert, awake and oriented Pain management: pain level controlled Vital Signs Assessment: post-procedure vital signs reviewed and stable Respiratory status: spontaneous breathing, nonlabored ventilation and respiratory function stable Cardiovascular status: blood pressure returned to baseline and stable Postop Assessment: no apparent nausea or vomiting Anesthetic complications: no   No complications documented.   Last Vitals:  Vitals:   09/01/20 0850 09/01/20 0900  BP: (!) 134/101 (!) 132/99  Pulse: 96 86  Resp: 17 19  Temp:    SpO2: 97% 97%    Last Pain:  Vitals:   09/01/20 0746  TempSrc: Oral  PainSc: 0-No pain                 Phill Mutter

## 2020-09-01 NOTE — Transfer of Care (Signed)
Immediate Anesthesia Transfer of Care Note  Patient: Alyssa Warren  Procedure(s) Performed: ESOPHAGOGASTRODUODENOSCOPY (EGD) WITH PROPOFOL (N/A )  Patient Location: PACU  Anesthesia Type:General  Level of Consciousness: sedated  Airway & Oxygen Therapy: Patient Spontanous Breathing and Patient connected to nasal cannula oxygen  Post-op Assessment: Report given to RN and Post -op Vital signs reviewed and stable  Post vital signs: Reviewed and stable  Last Vitals:  Vitals Value Taken Time  BP 133/97 09/01/20 0830  Temp    Pulse 110 09/01/20 0830  Resp 16 09/01/20 0830  SpO2 97 % 09/01/20 0830  Vitals shown include unvalidated device data.  Last Pain:  Vitals:   09/01/20 0746  TempSrc: Oral  PainSc: 0-No pain         Complications: No complications documented.

## 2020-09-02 ENCOUNTER — Ambulatory Visit
Admission: RE | Admit: 2020-09-02 | Discharge: 2020-09-02 | Disposition: A | Discharge status: Home / Self Care | Payer: No Typology Code available for payment source | Source: Ambulatory Visit | Attending: Family Medicine | Admitting: Family Medicine

## 2020-09-02 ENCOUNTER — Other Ambulatory Visit: Payer: Self-pay

## 2020-09-02 ENCOUNTER — Encounter: Payer: Self-pay | Admitting: Gastroenterology

## 2020-09-02 DIAGNOSIS — E1165 Type 2 diabetes mellitus with hyperglycemia: Secondary | ICD-10-CM

## 2020-09-02 DIAGNOSIS — K219 Gastro-esophageal reflux disease without esophagitis: Secondary | ICD-10-CM | POA: Diagnosis present

## 2020-09-02 DIAGNOSIS — Z8249 Family history of ischemic heart disease and other diseases of the circulatory system: Secondary | ICD-10-CM | POA: Diagnosis present

## 2020-09-02 DIAGNOSIS — R142 Eructation: Secondary | ICD-10-CM | POA: Diagnosis present

## 2020-09-02 DIAGNOSIS — R1013 Epigastric pain: Secondary | ICD-10-CM

## 2020-09-03 ENCOUNTER — Other Ambulatory Visit: Payer: Self-pay | Admitting: Family Medicine

## 2020-09-03 ENCOUNTER — Encounter: Payer: Self-pay | Admitting: *Deleted

## 2020-09-03 MED ORDER — LANTUS SOLOSTAR 100 UNIT/ML ~~LOC~~ SOPN: 30.0000 [IU] | PEN_INJECTOR | Freq: Every day | SUBCUTANEOUS | 11 refills | Status: DC

## 2020-09-03 NOTE — Progress Notes (Signed)
MyChart message sent to patient with this information.  Patient has reviewed her results but wanted to let her know to work on low fat diet.

## 2020-09-03 NOTE — Progress Notes (Signed)
Call... make sure pt got results: US showed fatty liver.... work on low fat diet.

## 2020-09-03 NOTE — Addendum Note (Signed)
Addended byEliezer Lofts E on: 09/03/2020 04:50 PM   Modules accepted: Orders

## 2020-09-24 ENCOUNTER — Other Ambulatory Visit: Payer: Self-pay | Admitting: Family Medicine

## 2020-09-28 MED ORDER — LANTUS SOLOSTAR 100 UNIT/ML ~~LOC~~ SOPN
30.0000 [IU] | PEN_INJECTOR | Freq: Every day | SUBCUTANEOUS | 6 refills | Status: DC
Start: 2020-09-28 — End: 2021-06-11

## 2020-09-30 IMAGING — MG DIGITAL DIAGNOSTIC UNILATERAL LEFT MAMMOGRAM WITH TOMO AND CAD
6 series · 6 of 18 positions shown · non-contrast
Comparison: Previous exam(s).

CLINICAL DATA: 47-year-old presenting with focal pain and a
possible palpable lumps in 2 separate locations in the LOWER LEFT
breast, one in the LOWER INNER QUADRANT at MIDDLE depth and the
other in the inframammary fold.

EXAM:
DIGITAL DIAGNOSTIC LEFT MAMMOGRAM WITH CAD AND TOMO
ULTRASOUND LEFT BREAST

[L CC synth-2D]
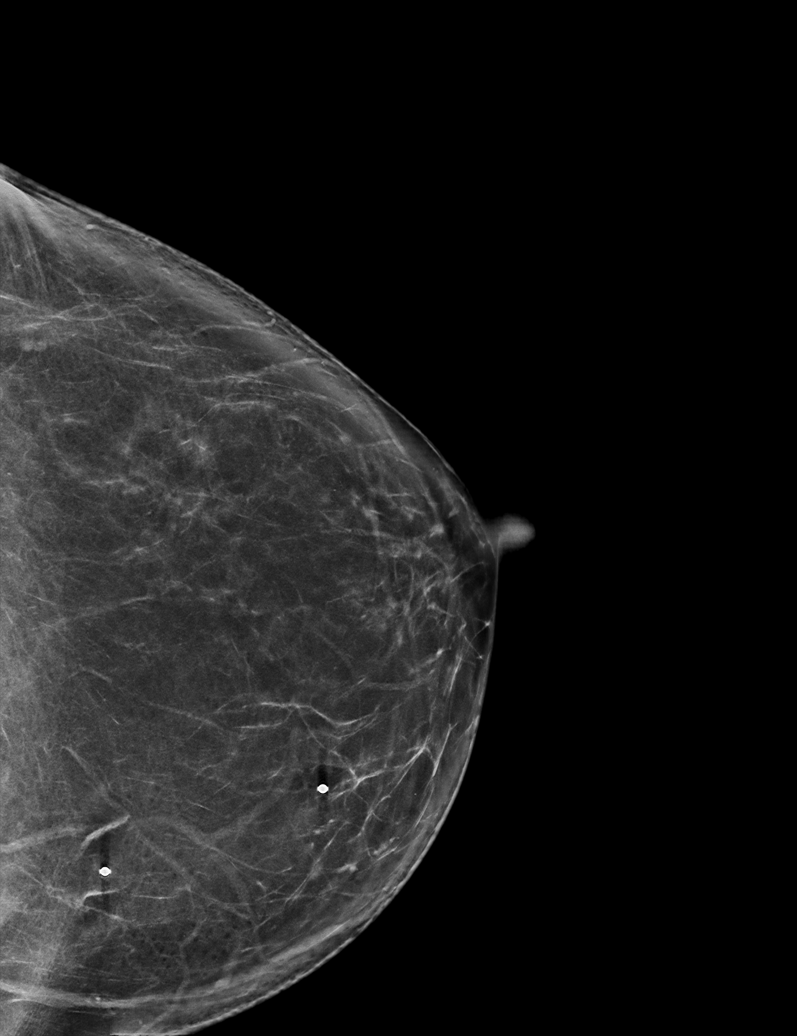

[L MLO synth-2D (1 of 2)]
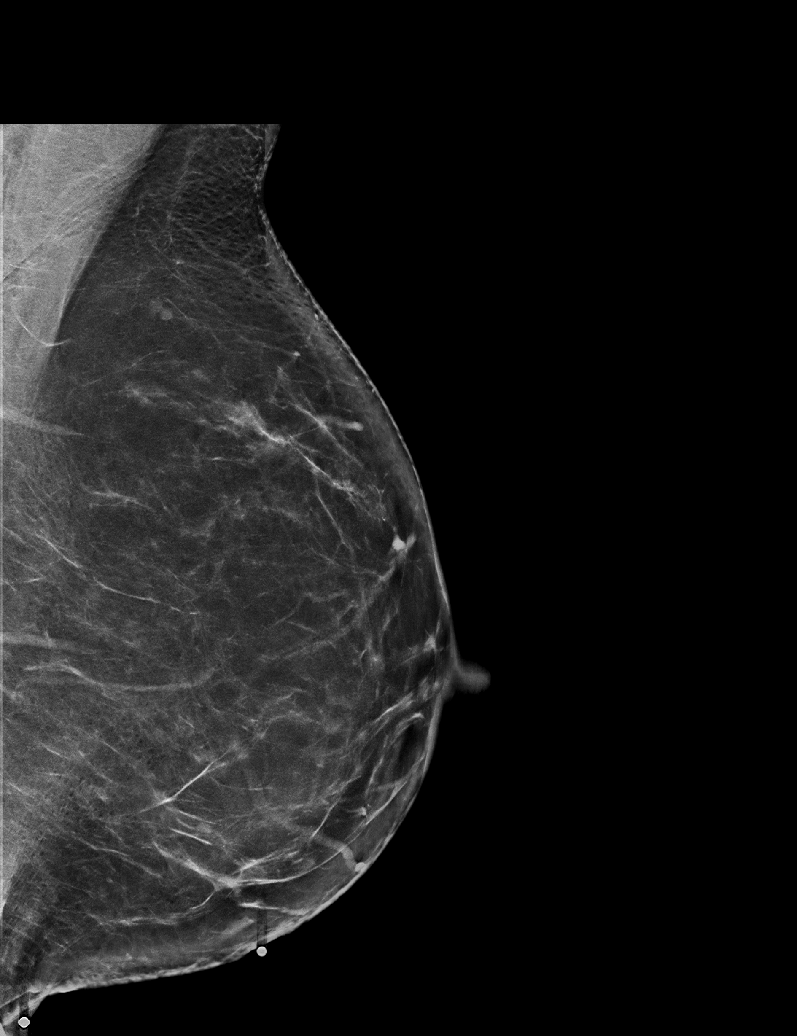

[L MLO synth-2D (2 of 2)]
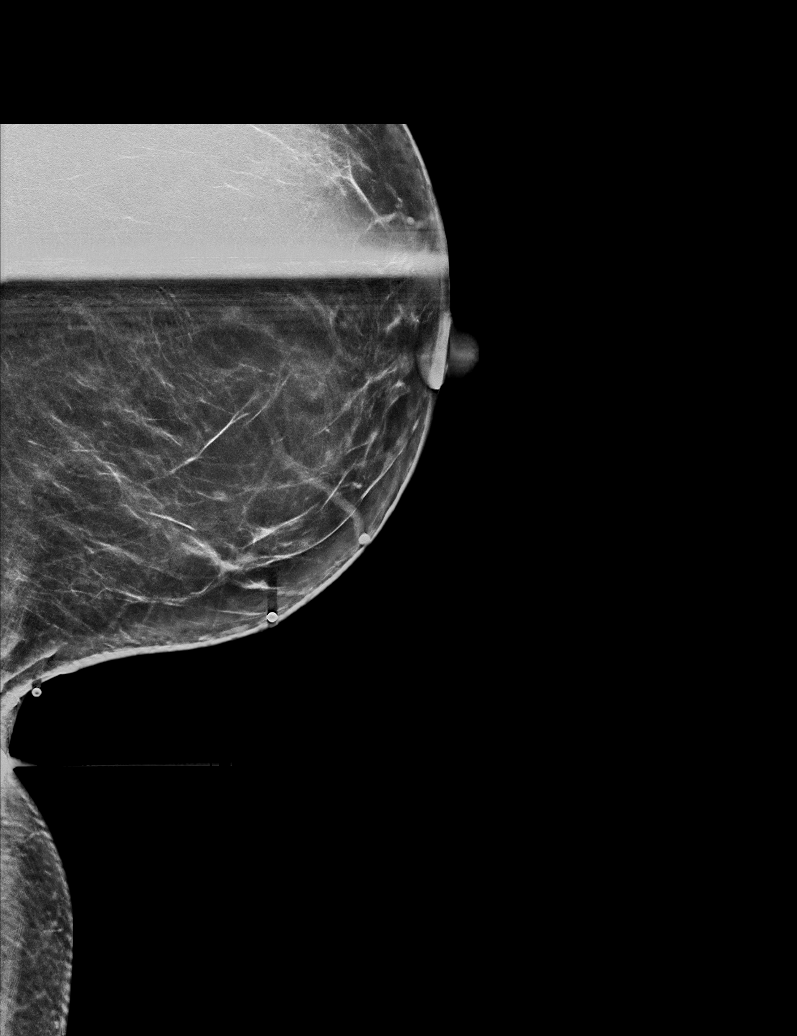

[L MLO tomo (1 of 2) · tomo slice 33/66.0]
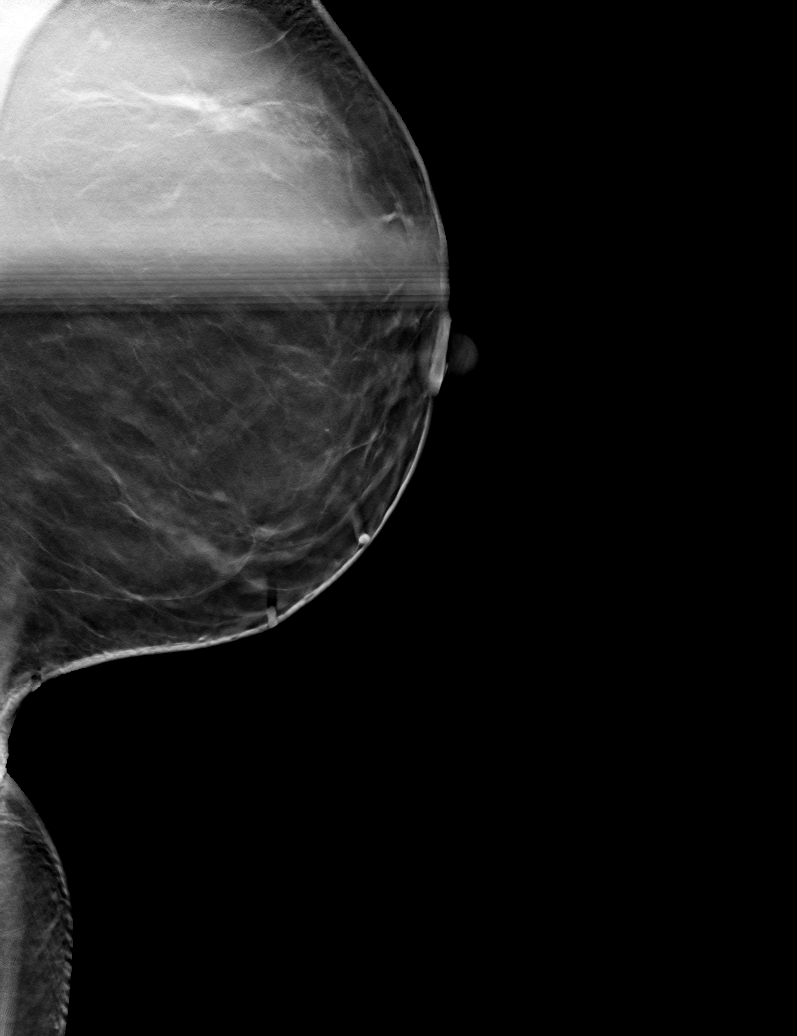

[L CC tomo · tomo slice 39/76.0]
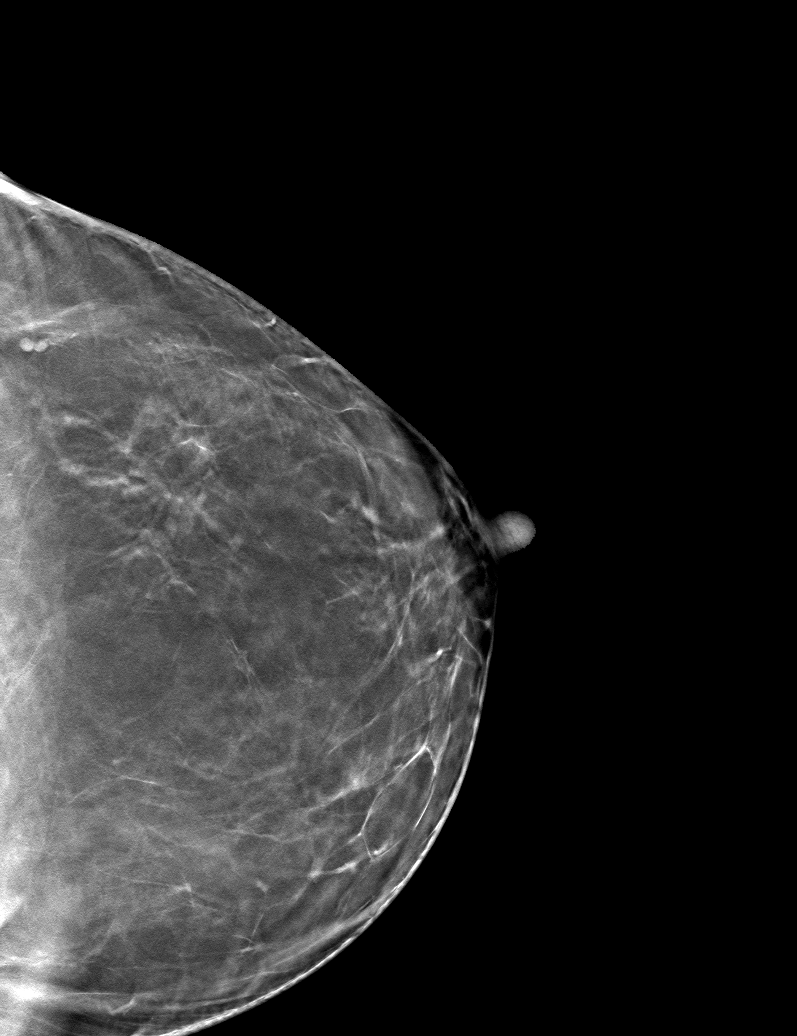

[L MLO tomo (2 of 2) · tomo slice 39/77.0]
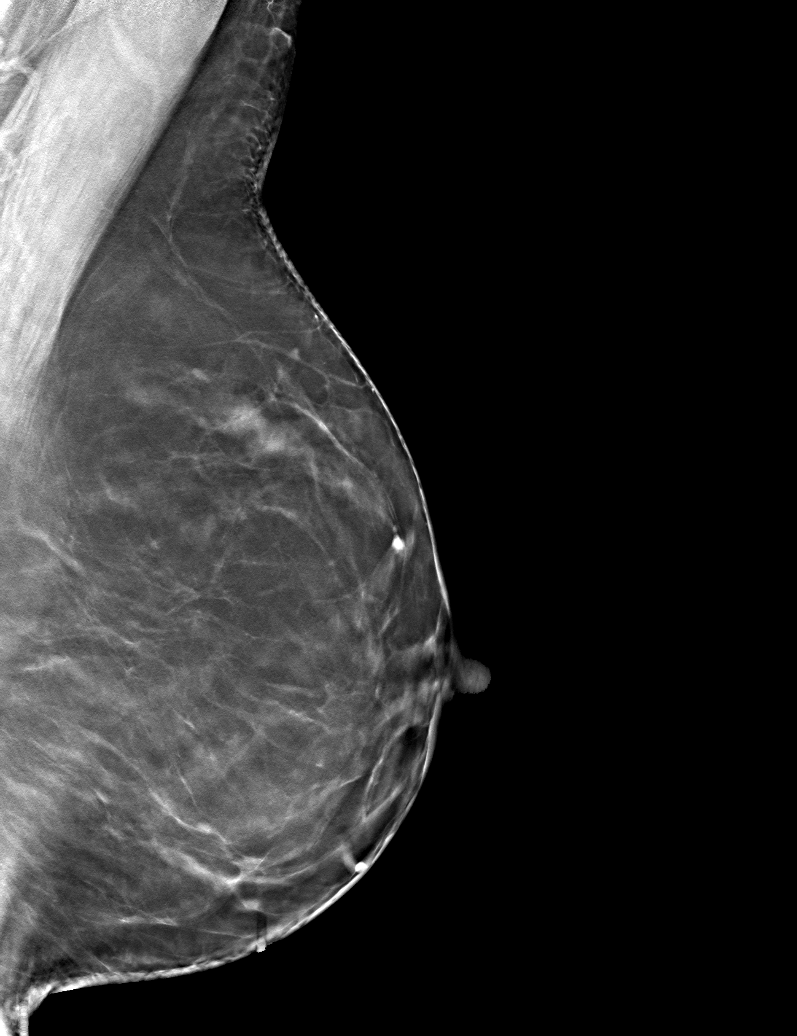

[6 of 18 positions shown; findings below may reference images not displayed]

ACR Breast Density Category b: There are scattered areas of
fibroglandular density.
FINDINGS: Tomosynthesis and synthesized full field CC and MLO views of the
LEFT breast were obtained. Tomosynthesis and synthesized spot
compression tangential view of the areas of concern in the LEFT
breast was also obtained.

No mammographic abnormality in the areas of palpable concern and
focal pain in the LOWER breast. Underlying the area of concern at
MIDDLE depth is an island of normal fibroglandular tissue. No
mammographic abnormality is present in the inframammary fold.

No findings suspicious for malignancy in the LEFT breast.

Mammographic images were processed with CAD.

On correlative physical exam, there is no palpable abnormality in
either location in the LOWER or INNER LEFT breast, though the
patient does describe tenderness to palpation in the inframammary
fold.

Targeted LEFT breast ultrasound is performed, showing normal
fibrofatty tissue in the inframammary fold at the 7 o'clock position
approximately 11 cm from the nipple. There may be small foci of fat
necrosis within the subcutaneous fat lobules, but node suspicious
solid mass or abnormal acoustic shadowing is identified.

Normal fibrofatty and scattered fibroglandular tissue is present in
the second area of concern at the 7 o'clock position approximately 6
cm from the nipple. No cyst, solid mass or abnormal acoustic
shadowing is identified.
IMPRESSION: No mammographic or sonographic evidence of malignancy involving the
LEFT breast.

RECOMMENDATION:
Annual BILATERAL screening mammography which is due in late October 2018.

I have discussed the findings and recommendations with the patient.
Results were also provided in writing at the conclusion of the
visit. If applicable, a reminder letter will be sent to the patient
regarding the next appointment.

BI-RADS CATEGORY  1: Negative.

## 2020-10-12 ENCOUNTER — Other Ambulatory Visit: Payer: Self-pay | Admitting: Family Medicine

## 2020-11-17 ENCOUNTER — Ambulatory Visit: Payer: No Typology Code available for payment source | Admitting: Gastroenterology

## 2020-12-26 ENCOUNTER — Other Ambulatory Visit: Payer: Self-pay | Admitting: Family Medicine

## 2020-12-26 NOTE — Telephone Encounter (Signed)
Please schedule CPE with fasting labs prior with Dr. Bedsole.  

## 2020-12-26 NOTE — Telephone Encounter (Signed)
Called pt to schedule appt. Pt did not answer so I left a VM.

## 2020-12-31 ENCOUNTER — Encounter: Payer: Self-pay | Admitting: Family Medicine

## 2020-12-31 NOTE — Telephone Encounter (Signed)
Called pt to schedule appt. Pt did not answer so I left a VM.

## 2020-12-31 NOTE — Telephone Encounter (Signed)
Mychart letter has been sent

## 2021-01-23 ENCOUNTER — Other Ambulatory Visit: Payer: Self-pay | Admitting: Family Medicine

## 2021-01-23 DIAGNOSIS — R002 Palpitations: Secondary | ICD-10-CM

## 2021-01-28 ENCOUNTER — Other Ambulatory Visit: Payer: Self-pay | Admitting: Family Medicine

## 2021-01-28 ENCOUNTER — Encounter: Payer: Self-pay | Admitting: Gastroenterology

## 2021-01-28 ENCOUNTER — Ambulatory Visit: Payer: No Typology Code available for payment source | Admitting: Gastroenterology

## 2021-01-28 LAB — HM DIABETES EYE EXAM

## 2021-02-13 ENCOUNTER — Encounter: Payer: Self-pay | Admitting: Family Medicine

## 2021-02-18 ENCOUNTER — Telehealth: Payer: Self-pay | Admitting: Family Medicine

## 2021-02-18 ENCOUNTER — Other Ambulatory Visit: Payer: Self-pay | Admitting: Family Medicine

## 2021-02-18 NOTE — Telephone Encounter (Signed)
Please schedule CPE with Dr. Diona Browner.

## 2021-02-18 NOTE — Telephone Encounter (Signed)
Sent mychart letter and lvm for pt to call office to schedule a cpe/lab

## 2021-02-19 NOTE — Telephone Encounter (Signed)
2nd attempt to call pt to get pt scheduled for cpe/lab

## 2021-03-10 MED ORDER — INSULIN PEN NEEDLE 31G X 8 MM MISC
3 refills | Status: DC
Start: 2021-03-10 — End: 2022-01-18

## 2021-04-24 ENCOUNTER — Other Ambulatory Visit: Payer: Self-pay | Admitting: Family Medicine

## 2021-04-24 DIAGNOSIS — R002 Palpitations: Secondary | ICD-10-CM

## 2021-04-24 NOTE — Telephone Encounter (Signed)
Rx sent in for 90 days. Pt must be scheduled for OV with Dr Diona Browner before the next refill.

## 2021-04-30 ENCOUNTER — Encounter: Payer: Self-pay | Admitting: Family Medicine

## 2021-04-30 NOTE — Telephone Encounter (Signed)
LMTCB to schedule a med refill  Mychart sent

## 2021-05-17 ENCOUNTER — Telehealth: Payer: Self-pay | Admitting: Family Medicine

## 2021-05-18 NOTE — Telephone Encounter (Signed)
Please call Alyssa Warren and schedule CPE with fasting labs prior.  Must schedule appointment in order to keep getting  refills on her medications.

## 2021-05-18 NOTE — Telephone Encounter (Signed)
Called pt and got her scheduled for 3/28 for labs andd 4/4 for CPE

## 2021-06-04 ENCOUNTER — Telehealth: Payer: Self-pay | Admitting: *Deleted

## 2021-06-04 NOTE — Telephone Encounter (Signed)
Received fax from [facility] requesting PA for Lantus Solostar.  PA completed on CoverMyMeds and sent to [facility] for review.  Can take up to 72 hours for a decision.

## 2021-06-09 ENCOUNTER — Encounter: Payer: Self-pay | Admitting: Family Medicine

## 2021-06-09 NOTE — Telephone Encounter (Signed)
Call pt.. if no med preference from choices we can use Tuojeo at same dose as Lantus.

## 2021-06-09 NOTE — Telephone Encounter (Signed)
PA denied. Placed fax from insurance on Dr [employer]. Per that fax patient must try and fail or unablet o take all formulary alternatives which are [place], Toujeo, Levemir and [place].  Patient was advised via mychart abotu the denial and that message is been sent to Dr Diona Browner to find out next steps.

## 2021-06-10 NOTE — Telephone Encounter (Signed)
Patient notified as instructed by telephone and verbalized understanding. Patient stated that she is willing to try whatever Dr. Diona Browner would recommend. Patient requested that the script be sent to [facility].

## 2021-06-11 MED ORDER — TOUJEO SOLOSTAR 300 UNIT/ML ~~LOC~~ SOPN: 30.0000 [IU] | PEN_INJECTOR | Freq: Every day | SUBCUTANEOUS | 0 refills | Status: DC

## 2021-06-11 NOTE — Addendum Note (Signed)
Addended by: Eliezer Lofts E on: 06/11/2021 08:57 AM   Modules accepted: Orders

## 2021-06-24 ENCOUNTER — Emergency Department: Payer: BC Managed Care – PPO

## 2021-06-24 ENCOUNTER — Encounter: Payer: Self-pay | Admitting: Emergency Medicine

## 2021-06-24 ENCOUNTER — Other Ambulatory Visit: Payer: Self-pay

## 2021-06-24 ENCOUNTER — Emergency Department
Admission: EM | Admit: 2021-06-24 | Discharge: 2021-06-24 | Disposition: A | Discharge status: Home / Self Care | Payer: BC Managed Care – PPO | Attending: Emergency Medicine | Admitting: Emergency Medicine

## 2021-06-24 DIAGNOSIS — R0789 Other chest pain: Secondary | ICD-10-CM | POA: Insufficient documentation

## 2021-06-24 DIAGNOSIS — R002 Palpitations: Secondary | ICD-10-CM | POA: Insufficient documentation

## 2021-06-24 DIAGNOSIS — E876 Hypokalemia: Secondary | ICD-10-CM

## 2021-06-24 DIAGNOSIS — R079 Chest pain, unspecified: Secondary | ICD-10-CM | POA: Diagnosis not present

## 2021-06-24 LAB — BASIC METABOLIC PANEL
Anion gap: 10 (ref 5–15)
BUN: 17 mg/dL (ref 6–20)
CO2: 26 mmol/L (ref 22–32)
Calcium: 9.3 mg/dL (ref 8.9–10.3)
Chloride: 98 mmol/L (ref 98–111)
Creatinine, Ser: 0.63 mg/dL (ref 0.44–1.00)
GFR, Estimated: >60 mL/min (ref >60)
Glucose, Bld: 301 mg/dL — ABNORMAL HIGH (ref 70–99)
Potassium: 3.4 mmol/L — ABNORMAL LOW (ref 3.5–5.1)
Sodium: 134 mmol/L — ABNORMAL LOW (ref 135–145)

## 2021-06-24 LAB — CBC
HCT: 42.4 % (ref 36.0–46.0)
Hemoglobin: 14.1 g/dL (ref 12.0–15.0)
MCH: 30.1 pg (ref 26.0–34.0)
MCHC: 33.3 g/dL (ref 30.0–36.0)
MCV: 90.4 fL (ref 80.0–100.0)
Platelets: 292 10*3/uL (ref 150–400)
RBC: 4.69 MIL/uL (ref 3.87–5.11)
RDW: 12.5 % (ref 11.5–15.5)
WBC: 8.9 10*3/uL (ref 4.0–10.5)
nRBC: 0 % (ref 0.0–0.2)

## 2021-06-24 LAB — D-DIMER, QUANTITATIVE: D-Dimer, Quant: <0.27 ug/mL-FEU (ref 0.00–0.50)

## 2021-06-24 LAB — TROPONIN I (HIGH SENSITIVITY)
Troponin I (High Sensitivity): 4 ng/L (ref <18)
Troponin I (High Sensitivity): 4 ng/L (ref <18)

## 2021-06-24 MED ORDER — POTASSIUM CHLORIDE CRYS ER 20 MEQ PO TBCR
40.0000 meq | EXTENDED_RELEASE_TABLET | Freq: Once | ORAL | Status: AC
Start: 2021-06-24 — End: 2021-06-24
  Administered 2021-06-24: 40 meq via ORAL
  Filled 2021-06-24: qty 2

## 2021-06-24 NOTE — ED Triage Notes (Signed)
Patient ambulatory to triage with steady gait, without difficulty or distress noted; pt reports pain to left side CP and jaw that began at 1130 accomp by palpitations ?

## 2021-06-24 NOTE — ED Provider Notes (Signed)
? ?[facility] ?Provider Note ? ? ? Event Date/Time  ? First MD Initiated Contact with Patient 06/24/21 0305   ?  (approximate) ? ? ?History  ? ?Chest Pain ? ? ?HPI ? ?Alyssa Warren is a 51 y.o. female who presents to the ED for evaluation of Chest Pain ?  ?I review outpatient EGD from May.  Normal esophagus and duodenum.  Fluid in the stomach and what could be visualized appeared normal. ? ?Patient presents to the ED for evaluation of left-sided chest pain with palpitations.  She reports being under a lot of stress recently.  She reports waking up this morning from sleep with left-sided chest pain and palpitations.  This is just been present over the past couple hours, now improving. ? ?Beyond the palpitations, denies any coexisting symptoms such as nausea, emesis, dizziness, syncope, falls or trauma.  Denies shortness of breath or cough.  Denies fever. ? ? ?Physical Exam  ? ?Triage Vital Signs: ?ED Triage Vitals [06/24/21 0131]  ?Enc Vitals Group  ?   BP (!) 146/98  ?   Pulse Rate 95  ?   Resp 18  ?   Temp 98.7 ?F (37.1 ?C)  ?   Temp Source Oral  ?   SpO2 98 %  ?   Weight 153 lb (69.4 kg)  ?   Height 5\' 6"  (1.676 m)  ?   Head Circumference   ?   Peak Flow   ?   Pain Score 4  ?   Pain Loc   ?   Pain Edu?   ?   Excl. in [place]?   ? ? ?Most recent vital signs: ?Vitals:  ? 06/24/21 0304 06/24/21 0400  ?BP: (!) 134/97 (!) 132/91  ?Pulse: 80 73  ?Resp: 17 15  ?Temp:    ?SpO2: 98% 95%  ? ? ?General: Awake, no distress.  ?CV:  Good peripheral perfusion. RRR ?Resp:  Normal effort.  CTA B ?Abd:  No distention.  Soft and benign ?MSK:  No deformity noted.  ?Neuro:  No focal deficits appreciated. Cranial nerves II through XII intact ?5/5 strength and sensation in all 4 extremities ?Other:   ? ? ?ED Results / Procedures / Treatments  ? ?Labs ?(all labs ordered are listed, but only abnormal results are displayed) ?Labs Reviewed  ?BASIC METABOLIC PANEL - Abnormal; Notable for the following components:   ?    Result Value  ? Sodium 134 (*)   ? Potassium 3.4 (*)   ? Glucose, Bld 301 (*)   ? All other components within normal limits  ?CBC  ?D-DIMER, QUANTITATIVE  ?TROPONIN I (HIGH SENSITIVITY)  ?TROPONIN I (HIGH SENSITIVITY)  ? ? ?EKG ?Sinus rhythm, rate of 93 bpm.  Normal axis and intervals.  No evidence of acute ischemia. ? ?RADIOLOGY ?CXR reviewed by me without evidence of acute cardiopulmonary pathology. ? ?Official radiology report(s): ?DG Chest 2 View ? ?Result Date: 06/24/2021 ?CLINICAL DATA:  Chest pain. EXAM: CHEST - 2 VIEW COMPARISON:  Radiograph dated 05/31/2019. FINDINGS: The heart size and mediastinal contours are within normal limits. Both lungs are clear. The visualized skeletal structures are unremarkable. IMPRESSION: No active cardiopulmonary disease. Electronically Signed   By: Anner Crete M.D.   On: 06/24/2021 02:12   ? ?PROCEDURES and INTERVENTIONS: ? ?.1-3 Lead EKG Interpretation ?Performed by: Vladimir Crofts, MD ?Authorized by: Vladimir Crofts, MD  ? ?  Interpretation: normal   ?  ECG rate:  70 ?  ECG rate  assessment: normal   ?  Rhythm: sinus rhythm   ?  Ectopy: none   ?  Conduction: normal   ? ?Medications  ?potassium chloride SA (KLOR-CON M) CR tablet 40 mEq (40 mEq Oral Given 06/24/21 0344)  ? ? ? ?IMPRESSION / MDM / ASSESSMENT AND PLAN / ED COURSE  ?I reviewed the triage vital signs and the nursing notes. ? ?51 year old woman presents to the ED with atypical chest pains, without evidence of acute pathology and suitable for outpatient management.  Looks clinically well to me with a normal examination.  No neurologic vascular deficits.  No skin changes, signs of herpes zoster.  Her work-up is benign without evidence of ACS.  2 negative troponins, nonischemic EKG.  D-dimer is negative and makes PE quite unlikely.  BMP with mild hypokalemia that was repleted orally, and hyperglycemia without acidosis.  Normal CBC.  I considered medical admission for observation of this patient, but she is fairly  low risk and has such a benign work-up I think she be suitable for trial of outpatient management.  We discussed return precautions. ? ?Clinical Course as of 06/24/21 0456  ?Wed Jun 24, 2021  ?0454 Reassessed.  Feeling well.  We discussed reassuring work-up.  We discussed possible etiologies of her symptoms and return precautions for the ED. [DS]  ?  ?Clinical Course User Index ?[DS] Vladimir Crofts, MD  ? ? ? ?FINAL CLINICAL IMPRESSION(S) / ED DIAGNOSES  ? ?Final diagnoses:  ?Other chest pain  ?Hypokalemia  ? ? ? ?Rx / DC Orders  ? ?ED Discharge Orders   ? ? None  ? ?  ? ? ? ?Note:  This document was prepared using Dragon voice recognition software and may include unintentional dictation errors. ?  ?Vladimir Crofts, MD ?06/24/21 [phone] ? ?

## 2021-07-10 ENCOUNTER — Other Ambulatory Visit: Payer: Self-pay | Admitting: Family Medicine

## 2021-07-16 ENCOUNTER — Telehealth: Payer: Self-pay | Admitting: Family Medicine

## 2021-07-16 DIAGNOSIS — E1169 Type 2 diabetes mellitus with other specified complication: Secondary | ICD-10-CM

## 2021-07-16 DIAGNOSIS — E1165 Type 2 diabetes mellitus with hyperglycemia: Secondary | ICD-10-CM

## 2021-07-16 NOTE — Telephone Encounter (Signed)
-----   Message from Ellamae Sia sent at 07/13/2021  4:21 PM EDT ----- ?Regarding: Lab orders for Tuesday, 07/21/21 ?Patient is scheduled for CPX labs, please order future labs, Thanks , Terri  ? ?

## 2021-07-21 ENCOUNTER — Other Ambulatory Visit (INDEPENDENT_AMBULATORY_CARE_PROVIDER_SITE_OTHER): Payer: BC Managed Care – PPO

## 2021-07-21 ENCOUNTER — Other Ambulatory Visit: Payer: Self-pay

## 2021-07-21 DIAGNOSIS — E1165 Type 2 diabetes mellitus with hyperglycemia: Secondary | ICD-10-CM | POA: Diagnosis not present

## 2021-07-21 LAB — COMPREHENSIVE METABOLIC PANEL
ALT: 24 U/L (ref 0–35)
AST: 18 U/L (ref 0–37)
Albumin: 4.4 g/dL (ref 3.5–5.2)
Alkaline Phosphatase: 76 U/L (ref 39–117)
BUN: 12 mg/dL (ref 6–23)
CO2: 32 mEq/L (ref 19–32)
Calcium: 9.6 mg/dL (ref 8.4–10.5)
Chloride: 97 mEq/L (ref 96–112)
Creatinine, Ser: 0.72 mg/dL (ref 0.40–1.20)
GFR: 96.75 mL/min (ref >60.00)
Glucose, Bld: 259 mg/dL — ABNORMAL HIGH (ref 70–99)
Potassium: 3.8 mEq/L (ref 3.5–5.1)
Sodium: 137 mEq/L (ref 135–145)
Total Bilirubin: 0.9 mg/dL (ref 0.2–1.2)
Total Protein: 6.6 g/dL (ref 6.0–8.3)

## 2021-07-21 LAB — HEMOGLOBIN A1C: Hgb A1c MFr Bld: 11.3 % — ABNORMAL HIGH (ref 4.6–6.5)

## 2021-07-21 LAB — LIPID PANEL
Cholesterol: 235 mg/dL — ABNORMAL HIGH (ref 0–200)
HDL: 42.4 mg/dL (ref >39.00)
Total CHOL/HDL Ratio: 6
Triglycerides: 515 mg/dL — ABNORMAL HIGH (ref 0.0–149.0)

## 2021-07-21 LAB — LDL CHOLESTEROL, DIRECT: Direct LDL: 116 mg/dL

## 2021-07-21 NOTE — Progress Notes (Signed)
No critical labs need to be addressed urgently. We will discuss labs in detail at upcoming office visit.   

## 2021-07-27 ENCOUNTER — Other Ambulatory Visit: Payer: Self-pay | Admitting: Family Medicine

## 2021-07-27 DIAGNOSIS — R002 Palpitations: Secondary | ICD-10-CM

## 2021-07-27 NOTE — Telephone Encounter (Signed)
Patient has CPE scheduled for 07/28/2021.  Please refill after appointment if appropriate.  ?

## 2021-07-28 ENCOUNTER — Encounter: Payer: Self-pay | Admitting: Family Medicine

## 2021-07-28 ENCOUNTER — Encounter: Payer: Self-pay | Admitting: *Deleted

## 2021-07-28 ENCOUNTER — Ambulatory Visit (INDEPENDENT_AMBULATORY_CARE_PROVIDER_SITE_OTHER): Payer: BC Managed Care – PPO | Admitting: Family Medicine

## 2021-07-28 ENCOUNTER — Telehealth: Payer: Self-pay | Admitting: *Deleted

## 2021-07-28 VITALS — BP 102/80 | HR 91 | Temp 98.7°F | Ht 65.5 in | Wt 159.5 lb

## 2021-07-28 DIAGNOSIS — E785 Hyperlipidemia, unspecified: Secondary | ICD-10-CM

## 2021-07-28 DIAGNOSIS — Z23 Encounter for immunization: Secondary | ICD-10-CM

## 2021-07-28 DIAGNOSIS — E1165 Type 2 diabetes mellitus with hyperglycemia: Secondary | ICD-10-CM

## 2021-07-28 DIAGNOSIS — R002 Palpitations: Secondary | ICD-10-CM

## 2021-07-28 DIAGNOSIS — I152 Hypertension secondary to endocrine disorders: Secondary | ICD-10-CM

## 2021-07-28 DIAGNOSIS — E1169 Type 2 diabetes mellitus with other specified complication: Secondary | ICD-10-CM

## 2021-07-28 DIAGNOSIS — Z0000 Encounter for general adult medical examination without abnormal findings: Secondary | ICD-10-CM

## 2021-07-28 DIAGNOSIS — E1159 Type 2 diabetes mellitus with other circulatory complications: Secondary | ICD-10-CM

## 2021-07-28 DIAGNOSIS — Z1211 Encounter for screening for malignant neoplasm of colon: Secondary | ICD-10-CM | POA: Diagnosis not present

## 2021-07-28 LAB — HM DIABETES FOOT EXAM

## 2021-07-28 MED ORDER — PANTOPRAZOLE SODIUM 40 MG PO TBEC: 40.0000 mg | DELAYED_RELEASE_TABLET | Freq: Every day | ORAL | 3 refills | Status: DC

## 2021-07-28 MED ORDER — VENLAFAXINE HCL ER 37.5 MG PO CP24: 37.5000 mg | ORAL_CAPSULE | Freq: Every day | ORAL | 0 refills | Status: DC

## 2021-07-28 MED ORDER — LOSARTAN POTASSIUM-HCTZ 50-12.5 MG PO TABS: 1.0000 | ORAL_TABLET | Freq: Every day | ORAL | 0 refills | Status: DC

## 2021-07-28 NOTE — Patient Instructions (Addendum)
Call if interested in continuous glucose monitor. ? Check blood sugar 2 hours after meals  9 goal < 180) as well as fasting ( goal < 80-120) ? Increase Tuojeo to 35 units daily. ? Work on low chol and low carbohydrate diet. ? We will set up colonoscopy. ? Return in 2-6 month for Shingrix vaccine ? ? Quit smoking.  ?

## 2021-07-28 NOTE — Telephone Encounter (Signed)
Received fax from [facility] requesting PA for Pantoprazole 40 mg.  PA completed on CoverMyMeds and sent for review.  Can take up to 72 hours for a decision.  ?

## 2021-07-28 NOTE — Progress Notes (Signed)
? ? Patient ID: Alyssa Warren, female    DOB: 1970/08/05, 51 y.o.   MRN: [id number] ? ?This visit was conducted in person. ? ?BP 102/80   Pulse 91   Temp 98.7 ?F (37.1 ?C) (Oral)   Ht 5' 5.5" (1.664 m)   Wt 159 lb 8 oz (72.3 kg)   LMP 10/12/2017   SpO2 95%   BMI 26.14 kg/m?   ? ?CC:  ?Chief Complaint  ?Patient presents with  ? Annual Exam  ? ? ?Subjective:  ? ?HPI: ?Alyssa Warren is a 51 y.o. female presenting on 07/28/2021 for Annual Exam ? ?Diabetes:   Poor control... never went to ENDO as recommended ? On metfomrin 1000 mg 1 tablet 2 times daily ? On Tuojeo 32 Units... she feels like she has lots of burping. ? Intolerant of victoza... emesis ? She feels stress level is very high. ? Moderate low carb diet, minimal activity over the winter. ?Lab Results  ?Component Value Date  ? HGBA1C 11.3 (H) 07/21/2021  ?Using medications without difficulties: ?Hypoglycemic episodes: none ?Hyperglycemic episodes: not checking. ?Feet problems: ?Blood Sugars averaging: FBS  160-170 ?eye exam within last year: ? ?Elevated Cholesterol: Inadequate control on atorvastatin 40 mg daily. ?Using medications without problems: ?Muscle aches:  ?Diet compliance: occ ?Exercise: poor ?Other complaints: ? ? ?   ? ?Relevant past medical, surgical, family and social history reviewed and updated as indicated. Interim medical history since our last visit reviewed. ?Allergies and medications reviewed and updated. ?Outpatient Medications Prior to Visit  ?Medication Sig Dispense Refill  ? acetaminophen (TYLENOL) 500 MG tablet Take 1,000 mg by mouth 2 (two) times daily as needed for moderate pain.    ? atorvastatin (LIPITOR) 40 MG tablet Take 1 tablet (40 mg total) by mouth daily. 90 tablet 3  ? Blood Glucose Monitoring Suppl (ONE TOUCH ULTRA SYSTEM KIT) w/Device KIT Use to check blood sugar one to two times daily.  Dx: E11.9 1 each 0  ? cyclobenzaprine (FLEXERIL) 5 MG tablet Take 5 mg by mouth 3 (three) times daily as needed for  muscle spasms.    ? glucose blood (ONE TOUCH ULTRA TEST) test strip Use to check blood sugar one to two times daily.  Dx: E11.9 200 each 3  ? Insulin Pen Needle 31G X 8 MM MISC Use to inject Victoza daily.  Dx: E11.65 90 each 3  ? losartan-hydrochlorothiazide (HYZAAR) 50-12.5 MG tablet TAKE 1 TABLET BY MOUTH DAILY 90 tablet 0  ? metFORMIN (GLUCOPHAGE) 1000 MG tablet TAKE 1 TABLET BY MOUTH TWICE DAILY WITH A MEAL 180 tablet 0  ? Multiple Vitamin (MULTIVITAMIN WITH MINERALS) TABS tablet Take 2 tablets by mouth daily with lunch.    ? ondansetron (ZOFRAN ODT) 4 MG disintegrating tablet Take 1 tablet (4 mg total) by mouth every 8 (eight) hours as needed for nausea or vomiting. 12 tablet 0  ? OneTouch Delica Lancets 35W MISC Use to check blood sugar one to two times daily.  Dx: E11.9 200 each 3  ? pantoprazole (PROTONIX) 40 MG tablet TAKE 1 TABLET(40 MG) BY MOUTH DAILY 90 tablet 0  ? TOUJEO SOLOSTAR 300 UNIT/ML Solostar Pen ADMINISTER 30 UNITS UNDER THE SKIN DAILY (Patient taking differently: 32 Units.) 3 mL 0  ? venlafaxine XR (EFFEXOR-XR) 37.5 MG 24 hr capsule TAKE 1 CAPSULE BY MOUTH DAILY 90 capsule 0  ? hydrOXYzine (ATARAX/VISTARIL) 10 MG tablet Take 1 tablet (10 mg total) by mouth 3 (three) times daily as needed. 30 tablet  0  ? ?No facility-administered medications prior to visit.  ?  ? ?Per HPI unless specifically indicated in ROS section below ?Review of Systems  ?Constitutional:  Negative for fatigue and fever.  ?HENT:  Negative for congestion.   ?Eyes:  Negative for pain.  ?Respiratory:  Negative for cough and shortness of breath.   ?Cardiovascular:  Negative for chest pain, palpitations and leg swelling.  ?Gastrointestinal:  Negative for abdominal pain.  ?Genitourinary:  Negative for dysuria and vaginal bleeding.  ?Musculoskeletal:  Negative for back pain.  ?Neurological:  Negative for syncope, light-headedness and headaches.  ?Psychiatric/Behavioral:  Negative for dysphoric mood.   ?Objective:  ?BP 102/80    Pulse 91   Temp 98.7 ?F (37.1 ?C) (Oral)   Ht 5' 5.5" (1.664 m)   Wt 159 lb 8 oz (72.3 kg)   LMP 10/12/2017   SpO2 95%   BMI 26.14 kg/m?   ?Wt Readings from Last 3 Encounters:  ?07/28/21 159 lb 8 oz (72.3 kg)  ?06/24/21 153 lb (69.4 kg)  ?09/01/20 153 lb (69.4 kg)  ?  ?  ?Physical Exam ?Constitutional:   ?   General: She is not in acute distress. ?   Appearance: Normal appearance. She is well-developed. She is not ill-appearing or toxic-appearing.  ?HENT:  ?   Head: Normocephalic.  ?   Right Ear: Hearing, tympanic membrane, ear canal and external ear normal. Tympanic membrane is not erythematous, retracted or bulging.  ?   Left Ear: Hearing, tympanic membrane, ear canal and external ear normal. Tympanic membrane is not erythematous, retracted or bulging.  ?   Nose: No mucosal edema or rhinorrhea.  ?   Right Sinus: No maxillary sinus tenderness or frontal sinus tenderness.  ?   Left Sinus: No maxillary sinus tenderness or frontal sinus tenderness.  ?   Mouth/Throat:  ?   Pharynx: Uvula midline.  ?Eyes:  ?   General: Lids are normal. Lids are everted, no foreign bodies appreciated.  ?   Conjunctiva/sclera: Conjunctivae normal.  ?   Pupils: Pupils are equal, round, and reactive to light.  ?Neck:  ?   Thyroid: No thyroid mass or thyromegaly.  ?   Vascular: No carotid bruit.  ?   Trachea: Trachea normal.  ?Cardiovascular:  ?   Rate and Rhythm: Normal rate and regular rhythm.  ?   Pulses: Normal pulses.  ?   Heart sounds: Normal heart sounds, S1 normal and S2 normal. No murmur heard. ?  No friction rub. No gallop.  ?Pulmonary:  ?   Effort: Pulmonary effort is normal. No tachypnea or respiratory distress.  ?   Breath sounds: Normal breath sounds. No decreased breath sounds, wheezing, rhonchi or rales.  ?Abdominal:  ?   General: Bowel sounds are normal.  ?   Palpations: Abdomen is soft.  ?   Tenderness: There is no abdominal tenderness.  ?Musculoskeletal:  ?   Cervical back: Normal range of motion and neck supple.   ?Skin: ?   General: Skin is warm and dry.  ?   Findings: No rash.  ?Neurological:  ?   Mental Status: She is alert.  ?Psychiatric:     ?   Mood and Affect: Mood is not anxious or depressed.     ?   Speech: Speech normal.     ?   Behavior: Behavior normal. Behavior is cooperative.     ?   Thought Content: Thought content normal.     ?   Judgment: Judgment  normal.  ? ? ?Diabetic foot exam: ?Normal inspection ?No skin breakdown ?No calluses  ?Normal DP pulses ?Normal sensation to light touch and monofilament ?Nails normal ? ?   ?Results for orders placed or performed in visit on 07/21/21  ?Comprehensive metabolic panel  ?Result Value Ref Range  ? Sodium 137 135 - 145 mEq/L  ? Potassium 3.8 3.5 - 5.1 mEq/L  ? Chloride 97 96 - 112 mEq/L  ? CO2 32 19 - 32 mEq/L  ? Glucose, Bld 259 (H) 70 - 99 mg/dL  ? BUN 12 6 - 23 mg/dL  ? Creatinine, Ser 0.72 0.40 - 1.20 mg/dL  ? Total Bilirubin 0.9 0.2 - 1.2 mg/dL  ? Alkaline Phosphatase 76 39 - 117 U/L  ? AST 18 0 - 37 U/L  ? ALT 24 0 - 35 U/L  ? Total Protein 6.6 6.0 - 8.3 g/dL  ? Albumin 4.4 3.5 - 5.2 g/dL  ? GFR 96.75 >60.00 mL/min  ? Calcium 9.6 8.4 - 10.5 mg/dL  ?Lipid panel  ?Result Value Ref Range  ? Cholesterol 235 (H) 0 - 200 mg/dL  ? Triglycerides (H) 0.0 - 149.0 mg/dL  ?  515.0 Triglyceride is over 400; calculations on Lipids are invalid.  ? HDL 42.40 >39.00 mg/dL  ? Total CHOL/HDL Ratio 6   ?Hemoglobin A1c  ?Result Value Ref Range  ? Hgb A1c MFr Bld 11.3 (H) 4.6 - 6.5 %  ?LDL cholesterol, direct  ?Result Value Ref Range  ? Direct LDL 116.0 mg/dL  ? ? ?This visit occurred during the SARS-CoV-2 public health emergency.  Safety protocols were in place, including screening questions prior to the visit, additional usage of staff PPE, and extensive cleaning of exam room while observing appropriate contact time as indicated for disinfecting solutions.  ? ?COVID 19 screen:  No recent travel or known exposure to [place] ?The patient denies respiratory symptoms of COVID 19 at this  time. ?The importance of social distancing was discussed today.  ? ?Assessment and Plan ? ? The patient's preventative maintenance and recommended screening tests for an annual wellness exam were reviewed in full today.

## 2021-08-03 MED ORDER — PROTONIX 40 MG PO TBEC: 40.0000 mg | DELAYED_RELEASE_TABLET | Freq: Every day | ORAL | 3 refills | Status: DC

## 2021-08-03 NOTE — Telephone Encounter (Signed)
PA for pantoprazole 40 mg was denied.  [employer] formulary is Aciphex and Protonix.   Rx sent to pharmacy for Brand Name Protonix 40 mg.   ?

## 2021-08-03 NOTE — Addendum Note (Signed)
Addended by: Carter Kitten on: 08/03/2021 03:11 PM ? ? Modules accepted: Orders ? ?

## 2021-08-04 ENCOUNTER — Telehealth: Payer: Self-pay

## 2021-08-04 ENCOUNTER — Telehealth: Payer: Self-pay | Admitting: Gastroenterology

## 2021-08-04 ENCOUNTER — Encounter: Payer: Self-pay | Admitting: Family Medicine

## 2021-08-04 ENCOUNTER — Other Ambulatory Visit: Payer: Self-pay

## 2021-08-04 DIAGNOSIS — Z1211 Encounter for screening for malignant neoplasm of colon: Secondary | ICD-10-CM

## 2021-08-04 MED ORDER — PEG 3350-KCL-NA BICARB-NACL 420 G PO SOLR: 4000.0000 mL | Freq: Once | ORAL | 0 refills | Status: AC

## 2021-08-04 NOTE — Telephone Encounter (Signed)
Patient left vm to schedule colonoscopy, requesting call back.  ?

## 2021-08-04 NOTE — Progress Notes (Signed)
Gastroenterology Pre-Procedure Review ? ?Request Date: 09/07/21 ?Requesting Physician: Dr. Vicente Males ? ?PATIENT REVIEW QUESTIONS: The patient responded to the following health history questions as indicated:   ? ?1. Are you having any GI issues? no ?2. Do you have a personal history of Polyps? no ?3. Do you have a family history of Colon Cancer or Polyps? yes (father colon polyps) ?4. Diabetes Mellitus? yes (type 2) ?5. Joint replacements in the past 12 months?no ?6. Major health problems in the past 3 months?no ?7. Any artificial heart valves, MVP, or defibrillator?no ?   ?MEDICATIONS & ALLERGIES:    ?Patient reports the following regarding taking any anticoagulation/antiplatelet therapy:   ?Plavix, Coumadin, Eliquis, Xarelto, Lovenox, Pradaxa, Brilinta, or Effient? no ?Aspirin? no ? ?Patient confirms/reports the following medications:  ?Current Outpatient Medications  ?Medication Sig Dispense Refill  ? acetaminophen (TYLENOL) 500 MG tablet Take 1,000 mg by mouth 2 (two) times daily as needed for moderate pain.    ? Blood Glucose Monitoring Suppl (ONE TOUCH ULTRA SYSTEM KIT) w/Device KIT Use to check blood sugar one to two times daily.  Dx: E11.9 1 each 0  ? cyclobenzaprine (FLEXERIL) 5 MG tablet Take 5 mg by mouth 3 (three) times daily as needed for muscle spasms.    ? glucose blood (ONE TOUCH ULTRA TEST) test strip Use to check blood sugar one to two times daily.  Dx: E11.9 200 each 3  ? Insulin Pen Needle 31G X 8 MM MISC Use to inject Victoza daily.  Dx: E11.65 90 each 3  ? losartan-hydrochlorothiazide (HYZAAR) 50-12.5 MG tablet Take 1 tablet by mouth daily. 90 tablet 0  ? metFORMIN (GLUCOPHAGE) 1000 MG tablet TAKE 1 TABLET BY MOUTH TWICE DAILY WITH A MEAL 180 tablet 0  ? Multiple Vitamin (MULTIVITAMIN WITH MINERALS) TABS tablet Take 2 tablets by mouth daily with lunch.    ? ondansetron (ZOFRAN ODT) 4 MG disintegrating tablet Take 1 tablet (4 mg total) by mouth every 8 (eight) hours as needed for nausea or vomiting.  12 tablet 0  ? OneTouch Delica Lancets 75Z MISC Use to check blood sugar one to two times daily.  Dx: E11.9 200 each 3  ? pantoprazole (PROTONIX) 40 MG tablet Take 1 tablet (40 mg total) by mouth daily. 90 tablet 3  ? PROTONIX 40 MG tablet Take 1 tablet (40 mg total) by mouth daily. 90 tablet 3  ? TOUJEO SOLOSTAR 300 UNIT/ML Solostar Pen ADMINISTER 30 UNITS UNDER THE SKIN DAILY (Patient taking differently: 32 Units.) 3 mL 0  ? venlafaxine XR (EFFEXOR-XR) 37.5 MG 24 hr capsule Take 1 capsule (37.5 mg total) by mouth daily. 90 capsule 0  ? ?No current facility-administered medications for this visit.  ? ? ?Patient confirms/reports the following allergies:  ?Allergies  ?Allergen Reactions  ? Penicillins Hives and Itching  ?  Has patient had a PCN reaction causing immediate rash, facial/tongue/throat swelling, SOB or lightheadedness with hypotension: Yes ?Has patient had a PCN reaction causing severe rash involving mucus membranes or skin necrosis: No ?Has patient had a PCN reaction that required hospitalization: No ?Has patient had a PCN reaction occurring within the last 10 years: No ?If all of the above answers are "NO", then may proceed with Cephalosporin use. ?  ? Keflex [Cephalexin] Other (See Comments)  ?  Makes loopy and feels like passing out  ? Methyldopa Other (See Comments)  ?  Feels like passing out   ? ? ?No orders of the defined types were placed in this  encounter. ? ? ?AUTHORIZATION INFORMATION ?Primary Insurance: ?1D#: ?Group #: ? ?Secondary Insurance: ?1D#: ?Group #: ? ?SCHEDULE INFORMATION: ?Date: 09/07/21 ?Time: ?Location: [facility] ?

## 2021-08-04 NOTE — Telephone Encounter (Signed)
CALLED PATIENT NO ANSWER LEFT VOICEMAIL FOR A CALL BACK ? ?

## 2021-08-05 MED ORDER — LANTUS SOLOSTAR 100 UNIT/ML ~~LOC~~ SOPN: 32.0000 [IU] | PEN_INJECTOR | Freq: Every day | SUBCUTANEOUS | 5 refills | Status: DC

## 2021-08-09 ENCOUNTER — Other Ambulatory Visit: Payer: Self-pay | Admitting: Family Medicine

## 2021-08-12 MED ORDER — PANTOPRAZOLE SODIUM 40 MG PO TBEC: 40.0000 mg | DELAYED_RELEASE_TABLET | Freq: Every day | ORAL | 3 refills | Status: DC

## 2021-08-12 NOTE — Addendum Note (Signed)
Addended by: Carter Kitten on: 08/12/2021 12:34 PM ? ? Modules accepted: Orders ? ?

## 2021-08-31 NOTE — Assessment & Plan Note (Signed)
Chronic, Inadequate control on atorvastatin 40 mg daily. ?

## 2021-08-31 NOTE — Assessment & Plan Note (Signed)
Stable, chronic.  Continue current medication. ? ? ?Losartan HCTZ 50/12.5 mg daily. ?

## 2021-08-31 NOTE — Assessment & Plan Note (Signed)
Chronic, inadequate control ? ?Call if interested in continuous glucose monitor. ? Check blood sugar 2 hours after meals  9 goal < 180) as well as fasting ( goal < 80-120) ? Increase Tuojeo to 35 units daily. ? Work on low chol and low carbohydrate diet. ?

## 2021-09-03 ENCOUNTER — Encounter: Payer: Self-pay | Admitting: Gastroenterology

## 2021-09-07 ENCOUNTER — Encounter: Payer: Self-pay | Admitting: Gastroenterology

## 2021-09-07 ENCOUNTER — Encounter
Admission: RE | Disposition: A | Discharge status: Home / Self Care | Payer: Self-pay | Source: Home / Self Care | Attending: Gastroenterology

## 2021-09-07 ENCOUNTER — Ambulatory Visit
Admission: RE | Admit: 2021-09-07 | Discharge: 2021-09-07 | Disposition: A | Discharge status: Home / Self Care | Payer: BC Managed Care – PPO | Attending: Gastroenterology | Admitting: Gastroenterology

## 2021-09-07 ENCOUNTER — Ambulatory Visit: Payer: BC Managed Care – PPO | Admitting: Registered Nurse

## 2021-09-07 DIAGNOSIS — E1065 Type 1 diabetes mellitus with hyperglycemia: Secondary | ICD-10-CM | POA: Diagnosis not present

## 2021-09-07 DIAGNOSIS — F32A Depression, unspecified: Secondary | ICD-10-CM | POA: Diagnosis not present

## 2021-09-07 DIAGNOSIS — I10 Essential (primary) hypertension: Secondary | ICD-10-CM | POA: Diagnosis not present

## 2021-09-07 DIAGNOSIS — K635 Polyp of colon: Secondary | ICD-10-CM | POA: Diagnosis not present

## 2021-09-07 DIAGNOSIS — K219 Gastro-esophageal reflux disease without esophagitis: Secondary | ICD-10-CM | POA: Diagnosis not present

## 2021-09-07 DIAGNOSIS — F419 Anxiety disorder, unspecified: Secondary | ICD-10-CM | POA: Insufficient documentation

## 2021-09-07 DIAGNOSIS — Z1211 Encounter for screening for malignant neoplasm of colon: Secondary | ICD-10-CM | POA: Insufficient documentation

## 2021-09-07 HISTORY — PX: COLONOSCOPY WITH PROPOFOL: SHX5780

## 2021-09-07 LAB — GLUCOSE, CAPILLARY: Glucose-Capillary: 288 mg/dL — ABNORMAL HIGH (ref 70–99)

## 2021-09-07 SURGERY — COLONOSCOPY WITH PROPOFOL
Anesthesia: General

## 2021-09-07 MED ORDER — DEXMEDETOMIDINE (PRECEDEX) IN NS 20 MCG/5ML (4 MCG/ML) IV SYRINGE
PREFILLED_SYRINGE | INTRAVENOUS | Status: DC | PRN
  Administered 2021-09-07 (×2): 4 ug via INTRAVENOUS

## 2021-09-07 MED ORDER — PROPOFOL 500 MG/50ML IV EMUL
INTRAVENOUS | Status: DC | PRN
  Administered 2021-09-07: 150 ug/kg/min via INTRAVENOUS

## 2021-09-07 MED ORDER — LIDOCAINE HCL (PF) 2 % IJ SOLN
INTRAMUSCULAR | Status: AC
  Filled 2021-09-07: qty 5

## 2021-09-07 MED ORDER — PROPOFOL 10 MG/ML IV BOLUS
INTRAVENOUS | Status: DC | PRN
  Administered 2021-09-07: 100 mg via INTRAVENOUS
  Administered 2021-09-07: 40 mg via INTRAVENOUS

## 2021-09-07 MED ORDER — LIDOCAINE HCL (CARDIAC) PF 100 MG/5ML IV SOSY
PREFILLED_SYRINGE | INTRAVENOUS | Status: DC | PRN
  Administered 2021-09-07: 40 mg via INTRAVENOUS

## 2021-09-07 MED ORDER — PROPOFOL 500 MG/50ML IV EMUL
INTRAVENOUS | Status: AC
  Filled 2021-09-07: qty 50

## 2021-09-07 MED ORDER — SODIUM CHLORIDE 0.9 % IV SOLN: INTRAVENOUS | Status: DC

## 2021-09-07 NOTE — H&P (Signed)
? ? ? ?Jonathon Bellows, MD ?[address], [place], [place], [place], [address] ?[address] [place], [place], [place], [address] ?Phone: [phone]  ?Fax: [phone] ? ?Primary Care Physician:  Jinny Sanders, MD ? ? ?Pre-Procedure History & Physical: ?HPI:  Alyssa Warren is a 51 y.o. female is here for an colonoscopy. ?  ?Past Medical History:  ?Diagnosis Date  ? Allergy   ? Arthritis   ? LEFT KNEE  ? Diabetes mellitus without complication ([place])   ? GERD (gastroesophageal reflux disease)   ? OCC  ? History of kidney stones   ? H/O  ? Hypertension   ? MVP (mitral valve prolapse)   ? PCOS (polycystic ovarian syndrome)   ? ? ?Past Surgical History:  ?Procedure Laterality Date  ? BREAST BIOPSY Right 02/2012  ? x2. benign  ? cyst removed from ovary    ? CYSTOSCOPY  11/01/2017  ? Procedure: CYSTOSCOPY;  Surgeon: Gae Dry, MD;  Location: [facility] ORS;  Service: Gynecology;;  ? [place] N/A 12/23/2016  ? Procedure: DILATATION & CURETTAGE/HYSTEROSCOPY ENDOMETRIAL ABLATION-MINERVA;  Surgeon: Gae Dry, MD;  Location: [facility] ORS;  Service: Gynecology;  Laterality: N/A;  ? ESOPHAGOGASTRODUODENOSCOPY (EGD) WITH PROPOFOL N/A 09/01/2020  ? Procedure: ESOPHAGOGASTRODUODENOSCOPY (EGD) WITH PROPOFOL;  Surgeon: Jonathon Bellows, MD;  Location: [facility] ENDOSCOPY;  Service: Gastroenterology;  Laterality: N/A;  ? KNEE SURGERY Left   ? LABIOPLASTY N/A 07/12/2017  ? Procedure: EXCISION OF LABIAL CYST;  Surgeon: Gae Dry, MD;  Location: [facility] ORS;  Service: Gynecology;  Laterality: N/A;  ? LAPAROSCOPIC HYSTERECTOMY Bilateral 11/01/2017  ? Procedure: HYSTERECTOMY TOTAL LAPAROSCOPIC BILATERAL SALPINGECTOMY, RIGHT OVARIAN CYSTECTOMY;  Surgeon: Gae Dry, MD;  Location: [facility] ORS;  Service: Gynecology;  Laterality: Bilateral;  ? LAPAROSCOPIC SALPINGO OOPHERECTOMY Left 12/23/2016  ? Procedure: LAPAROSCOPIC SALPINGO OOPHORECTOMY;  Surgeon: Gae Dry, MD;  Location: [facility]  ORS;  Service: Gynecology;  Laterality: Left;  ? plantar fasciitis release Bilateral   ? ROTATOR CUFF REPAIR Right   ? TUBAL LIGATION    ? ? ?Prior to Admission medications   ?Medication Sig Start Date End Date Taking? Authorizing Provider  ?acetaminophen (TYLENOL) 500 MG tablet Take 1,000 mg by mouth 2 (two) times daily as needed for moderate pain.   Yes [provider]  ?Blood Glucose Monitoring Suppl (ONE TOUCH ULTRA SYSTEM KIT) w/Device KIT Use to check blood sugar one to two times daily.  Dx: E11.9 08/13/16  Yes Bedsole, Amy E, MD  ?insulin glargine (LANTUS SOLOSTAR) 100 UNIT/ML Solostar Pen Inject 32 Units into the skin daily. 08/05/21  Yes Bedsole, Amy E, MD  ?Insulin Pen Needle 31G X 8 MM MISC Use to inject Victoza daily.  Dx: E11.65 03/10/21  Yes Bedsole, Amy E, MD  ?losartan-hydrochlorothiazide (HYZAAR) 50-12.5 MG tablet Take 1 tablet by mouth daily. 07/28/21  Yes Bedsole, Amy E, MD  ?metFORMIN (GLUCOPHAGE) 1000 MG tablet TAKE 1 TABLET BY MOUTH TWICE DAILY WITH A MEAL 01/28/21  Yes Bedsole, Amy E, MD  ?Multiple Vitamin (MULTIVITAMIN WITH MINERALS) TABS tablet Take 2 tablets by mouth daily with lunch.   Yes [provider]  ?Jonetta Speak Lancets 25Q MISC Use to check blood sugar one to two times daily.  Dx: E11.9 08/07/18  Yes Bedsole, Amy E, MD  ?pantoprazole (PROTONIX) 40 MG tablet Take 1 tablet (40 mg total) by mouth daily. 08/12/21  Yes Bedsole, Amy E, MD  ?venlafaxine XR (EFFEXOR-XR) 37.5 MG 24 hr capsule Take  1 capsule (37.5 mg total) by mouth daily. 07/28/21  Yes Bedsole, Amy E, MD  ?cyclobenzaprine (FLEXERIL) 5 MG tablet Take 5 mg by mouth 3 (three) times daily as needed for muscle spasms. ?Patient not taking: Reported on 09/07/2021    [provider]  ?glucose blood (ONE TOUCH ULTRA TEST) test strip Use to check blood sugar one to two times daily.  Dx: E11.9 08/07/18   Jinny Sanders, MD  ?ondansetron (ZOFRAN ODT) 4 MG disintegrating tablet Take 1 tablet (4 mg total) by mouth  every 8 (eight) hours as needed for nausea or vomiting. 07/29/20   Jinny Sanders, MD  ? ? ?Allergies as of 08/05/2021 - Review Complete 07/28/2021  ?Allergen Reaction Noted  ? Penicillins Hives and Itching   ? Keflex [cephalexin] Other (See Comments) 12/05/2014  ? Methyldopa Other (See Comments) 06/16/2006  ? ? ?Family History  ?Problem Relation Age of Onset  ? Breast cancer Maternal Aunt   ?     great Aunts   ? Breast cancer Paternal Aunt   ? Heart attack Brother   ? ? ?Social History  ? ?Socioeconomic History  ? Marital status: Divorced  ?  Spouse name: Not on file  ? Number of children: Not on file  ? Years of education: Not on file  ? Highest education level: Not on file  ?Occupational History  ? Not on file  ?Tobacco Use  ? Smoking status: Every Day  ?  Packs/day: 0.25  ?  Years: 15.00  ?  Pack years: 3.75  ?  Types: Cigarettes  ? Smokeless tobacco: Never  ? Tobacco comments:  ?  a few a day  ?Vaping Use  ? Vaping Use: Never used  ?Substance and Sexual Activity  ? Alcohol use: No  ?  Alcohol/week: 0.0 standard drinks  ? Drug use: No  ? Sexual activity: Never  ?Other Topics Concern  ? Not on file  ?Social History Narrative  ? Not on file  ? ?Social Determinants of Health  ? ?Financial Resource Strain: Not on file  ?Food Insecurity: Not on file  ?Transportation Needs: Not on file  ?Physical Activity: Not on file  ?Stress: Not on file  ?Social Connections: Not on file  ?Intimate Partner Violence: Not on file  ? ? ?Review of Systems: ?See HPI, otherwise negative ROS ? ?Physical Exam: ?BP (!) 132/104   Pulse (!) 103   Temp (!) 96.9 ?F (36.1 ?C) (Temporal)   Resp 16   Ht '5\' 6"'  (1.676 m)   Wt 74.5 kg   LMP 10/12/2017   SpO2 98%   BMI 26.52 kg/m?  ?General:   Alert,  pleasant and cooperative in NAD ?Head:  Normocephalic and atraumatic. ?Neck:  Supple; no masses or thyromegaly. ?Lungs:  Clear throughout to auscultation, normal respiratory effort.    ?Heart:  +S1, +S2, Regular rate and rhythm, No edema. ?Abdomen:   Soft, nontender and nondistended. Normal bowel sounds, without guarding, and without rebound.   ?Neurologic:  Alert and  oriented x4;  grossly normal neurologically. ? ?Impression/Plan: ?Alyssa Warren is here for an colonoscopy to be performed for Screening colonoscopy average risk   ?Risks, benefits, limitations, and alternatives regarding  colonoscopy have been reviewed with the patient.  Questions have been answered.  All parties agreeable. ? ? ?Jonathon Bellows, MD  09/07/2021, 7:45 AM ? ?

## 2021-09-07 NOTE — Anesthesia Preprocedure Evaluation (Signed)
Anesthesia Evaluation  ?Patient identified by MRN, date of birth, ID band ?Patient awake ? ? ? ?Reviewed: ?Allergy & Precautions, H&P , NPO status , Patient's Chart, lab work & pertinent test results, reviewed documented beta blocker date and time  ? ?History of Anesthesia Complications ?(+) PONV and history of anesthetic complications ? ?Airway ?Mallampati: II ? ?TM Distance: >3 FB ?Neck ROM: full ? ? ? Dental ?no notable dental hx. ?(+) Caps, Dental Advidsory Given, Teeth Intact ?  ?Pulmonary ?neg pulmonary ROS, Current Smoker and Patient abstained from smoking., former smoker,  ?  ?Pulmonary exam normal ? ? ? ? ? ? ? Cardiovascular ?Exercise Tolerance: Good ?hypertension, (-) angina(-) CAD, (-) Past MI, (-) Cardiac Stents and (-) CABG Normal cardiovascular exam(-) dysrhythmias + Valvular Problems/Murmurs AS  ? ? ?  ?Neuro/Psych ?neg Seizures PSYCHIATRIC DISORDERS (Depression) Anxiety Depression  Neuromuscular disease   ? GI/Hepatic ?Neg liver ROS, GERD  Poorly Controlled,  ?Endo/Other  ?diabetes, Poorly Controlled, Type 1, Oral Hypoglycemic Agents, Insulin Dependent ? Renal/GU ?negative Renal ROS  ?negative genitourinary ?  ?Musculoskeletal ? ?(+) Arthritis , Osteoarthritis,   ? Abdominal ?Normal abdominal exam  (+)   ?Peds ? Hematology ?negative hematology ROS ?(+)   ?Anesthesia Other Findings ?Past Medical History: ?No date: Allergy ?No date: Diabetes mellitus without complication ([place]) ?No date: Hypertension ? ? Reproductive/Obstetrics ?negative OB ROS ? ?  ? ? ? ? ? ? ? ? ? ? ? ? ? ?  ?  ? ? ? ? ? ? ? ? ?Anesthesia Physical ? ?Anesthesia Plan ? ?ASA: II ? ?Anesthesia Plan: General  ? ?Post-op Pain Management:   ? ?Induction: Intravenous ? ?PONV Risk Score and Plan: 4 or greater and Propofol infusion and TIVA ? ?Airway Management Planned: Nasal Cannula ? ?Additional Equipment:  ? ?Intra-op Plan:  ? ?Post-operative Plan: Extubation in OR ? ?Informed Consent: I have reviewed the  patients History and Physical, chart, labs and discussed the procedure including the risks, benefits and alternatives for the proposed anesthesia with the patient or authorized representative who has indicated his/her understanding and acceptance.  ? ? ? ?Dental Advisory Given ? ?Plan Discussed with: Anesthesiologist, CRNA and Surgeon ? ?Anesthesia Plan Comments:   ? ? ? ? ? ? ?Anesthesia Quick Evaluation ? ?

## 2021-09-07 NOTE — Op Note (Signed)
[facility] ?Gastroenterology ?Patient Name: Alyssa Warren ?Procedure Date: 09/07/2021 7:24 AM ?MRN: [id number] ?Account #: [id number] ?Date of Birth: [birth year] ?Admit Type: Outpatient ?Age: 51 ?Room: [facility] ENDO ROOM 4 ?Gender: Female ?Note Status: Finalized ?Instrument Name: Colonoscope [id number] ?Procedure:             Colonoscopy ?Indications:           Screening for colorectal malignant neoplasm ?Providers:             Jonathon Bellows MD, MD ?Referring MD:          Jinny Sanders MD, MD (Referring MD) ?Medicines:             Monitored Anesthesia Care ?Complications:         No immediate complications. ?Procedure:             Pre-Anesthesia Assessment: ?                       - Prior to the procedure, a History and Physical was  ?                       performed, and patient medications, allergies and  ?                       sensitivities were reviewed. The patient's tolerance  ?                       of previous anesthesia was reviewed. ?                       - The risks and benefits of the procedure and the  ?                       sedation options and risks were discussed with the  ?                       patient. All questions were answered and informed  ?                       consent was obtained. ?                       - ASA Grade Assessment: II - A patient with mild  ?                       systemic disease. ?                       After obtaining informed consent, the colonoscope was  ?                       passed under direct vision. Throughout the procedure,  ?                       the patient's blood pressure, pulse, and oxygen  ?                       saturations were monitored continuously. The  ?                       Colonoscope  was introduced through the anus and  ?                       advanced to the the cecum, identified by the  ?                       appendiceal orifice. The colonoscopy was performed  ?                       with ease. The patient tolerated the procedure  well.  ?                       The quality of the bowel preparation was excellent. ?Findings: ?     The perianal and digital rectal examinations were normal. ?     A 6 mm polyp was found in the cecum. The polyp was sessile. The polyp  ?     was removed with a jumbo cold forceps. Resection and retrieval were  ?     complete. ?     The exam was otherwise without abnormality on direct and retroflexion  ?     views. ?Impression:            - One 6 mm polyp in the cecum, removed with a jumbo  ?                       cold forceps. Resected and retrieved. ?                       - The examination was otherwise normal on direct and  ?                       retroflexion views. ?Recommendation:        - Discharge patient to home (with escort). ?                       - Resume previous diet. ?                       - Continue present medications. ?                       - Await pathology results. ?                       - Repeat colonoscopy for surveillance based on  ?                       pathology results. ?Procedure Code(s):     --- Professional --- ?                       [id number], Colonoscopy, flexible; with biopsy, single or  ?                       multiple ?Diagnosis Code(s):     --- Professional --- ?                       Z12.11, Encounter for screening for malignant neoplasm  ?  of colon ?                       K63.5, Polyp of colon ?CPT copyright 2019 American Medical Association. All rights reserved. ?The codes documented in this report are preliminary and upon coder review may  ?be revised to meet current compliance requirements. ?Jonathon Bellows, MD ?Jonathon Bellows MD, MD ?09/07/2021 8:07:42 AM ?This report has been signed electronically. ?Number of Addenda: 0 ?Note Initiated On: 09/07/2021 7:24 AM ?Scope Withdrawal Time: 0 hours 11 minutes 31 seconds  ?Total Procedure Duration: 0 hours 14 minutes 29 seconds  ?Estimated Blood Loss:  Estimated blood loss: none. ?     [facility] ?

## 2021-09-07 NOTE — Transfer of Care (Signed)
Immediate Anesthesia Transfer of Care Note ? ?Patient: Alyssa Warren ? ?Procedure(s) Performed: Procedure(s): ?COLONOSCOPY WITH PROPOFOL (N/A) ? ?Patient Location: PACU and Endoscopy Unit ? ?Anesthesia Type:General ? ?Level of Consciousness: sedated ? ?Airway & Oxygen Therapy: Patient Spontanous Breathing and Patient connected to nasal cannula oxygen ? ?Post-op Assessment: Report given to RN and Post -op Vital signs reviewed and stable ? ?Post vital signs: Reviewed and stable ? ?Last Vitals:  ?Vitals:  ? 09/07/21 0704  ?BP: (!) 132/104  ?Pulse: (!) 103  ?Resp: 16  ?Temp: (!) 36.1 ?C  ?SpO2: 98%  ? ? ?Complications: No apparent anesthesia complications ?

## 2021-09-08 ENCOUNTER — Encounter: Payer: Self-pay | Admitting: Gastroenterology

## 2021-09-08 LAB — SURGICAL PATHOLOGY

## 2021-09-08 NOTE — Anesthesia Postprocedure Evaluation (Signed)
Anesthesia Post Note ? ?Patient: Alyssa Warren ? ?Procedure(s) Performed: COLONOSCOPY WITH PROPOFOL ? ?Patient location during evaluation: Endoscopy ?Anesthesia Type: General ?Level of consciousness: awake and alert ?Pain management: pain level controlled ?Vital Signs Assessment: post-procedure vital signs reviewed and stable ?Respiratory status: spontaneous breathing, nonlabored ventilation, respiratory function stable and patient connected to nasal cannula oxygen ?Cardiovascular status: blood pressure returned to baseline and stable ?Postop Assessment: no apparent nausea or vomiting ?Anesthetic complications: no ? ? ?No notable events documented. ? ? ?Last Vitals:  ?Vitals:  ? 09/07/21 0820 09/07/21 0830  ?BP: 113/86 123/90  ?Pulse: 75 79  ?Resp: 16 16  ?Temp:    ?SpO2: 97%   ?  ?Last Pain:  ?Vitals:  ? 09/08/21 0748  ?TempSrc:   ?PainSc: 0-No pain  ? ? ?  ?  ?  ?  ?  ?  ? ?Martha Clan ? ? ? ? ?

## 2021-09-10 ENCOUNTER — Encounter: Payer: Self-pay | Admitting: Gastroenterology

## 2021-09-28 ENCOUNTER — Other Ambulatory Visit: Payer: Self-pay | Admitting: Family Medicine

## 2021-09-28 DIAGNOSIS — E781 Pure hyperglyceridemia: Secondary | ICD-10-CM

## 2021-10-21 ENCOUNTER — Telehealth: Payer: Self-pay | Admitting: Family Medicine

## 2021-10-21 DIAGNOSIS — R002 Palpitations: Secondary | ICD-10-CM

## 2021-10-21 NOTE — Telephone Encounter (Signed)
Please call and schedule Diabetes follow up with labs prior with Dr. Diona Browner.  She had wanted to see patient early July if she hadn't gotten in with Endocrinologist yet and that appointment isn't until Sept 25.

## 2021-10-22 NOTE — Telephone Encounter (Signed)
Patient scheduled for August the 3rd, she needed a 4pm

## 2021-11-06 NOTE — Telephone Encounter (Signed)
error 

## 2021-11-17 ENCOUNTER — Other Ambulatory Visit: Payer: Self-pay | Admitting: Family Medicine

## 2021-11-26 ENCOUNTER — Ambulatory Visit: Payer: BC Managed Care – PPO | Admitting: Family Medicine

## 2021-12-01 ENCOUNTER — Ambulatory Visit: Payer: BC Managed Care – PPO | Admitting: Family Medicine

## 2021-12-01 ENCOUNTER — Encounter: Payer: Self-pay | Admitting: Family Medicine

## 2021-12-01 VITALS — BP 120/86 | HR 103 | Temp 98.3°F | Ht 65.5 in | Wt 158.5 lb

## 2021-12-01 DIAGNOSIS — E1165 Type 2 diabetes mellitus with hyperglycemia: Secondary | ICD-10-CM | POA: Diagnosis not present

## 2021-12-01 DIAGNOSIS — Z23 Encounter for immunization: Secondary | ICD-10-CM | POA: Diagnosis not present

## 2021-12-01 DIAGNOSIS — E1159 Type 2 diabetes mellitus with other circulatory complications: Secondary | ICD-10-CM

## 2021-12-01 DIAGNOSIS — I152 Hypertension secondary to endocrine disorders: Secondary | ICD-10-CM | POA: Diagnosis not present

## 2021-12-01 LAB — POCT GLYCOSYLATED HEMOGLOBIN (HGB A1C): Hemoglobin A1C: 11.6 % — AB (ref 4.0–5.6)

## 2021-12-01 NOTE — Progress Notes (Signed)
Patient ID: Alyssa Warren, female    DOB: 09/25/1970, 51 y.o.   MRN: [id number]  This visit was conducted in person.  BP 120/86   Pulse (!) 103   Temp 98.3 F (36.8 C) (Oral)   Ht 5' 5.5" (1.664 m)   Wt 158 lb 8 oz (71.9 kg)   LMP 10/12/2017 Comment: upreg - neg  SpO2 97%   BMI 25.97 kg/m    CC:  Chief Complaint  Patient presents with   Diabetes    Subjective:   HPI: Alyssa Warren is a 51 y.o. female presenting on 12/01/2021 for Diabetes  Diabetes: Continue poor control of diabetes.  A1c slightly worse from 11.3 now up at 11.6. And at the last office visit in May her Toujeo was increased to 35 units daily.... she is actually taking 38 Units and it has changed to [employer] per insurance. She was continued on metformin 1000 mg short acting 1 tablet twice daily. In the past Jardiance has been ineffective.  Minimal control at higher dose of Victoza and associated nausea so this was stopped. Lab Results  Component Value Date   HGBA1C 11.6 (A) 12/01/2021  Using medications without difficulties: Hypoglycemic episodes: none Hyperglycemic episodes:  Yes Feet problems: no ulcers Blood Sugars averaging: FBS 258 eye exam within last year: yes   She has been working on low carb diet and has been trying to exercise Wt Readings from Last 3 Encounters:  12/01/21 158 lb 8 oz (71.9 kg)  09/07/21 164 lb 5.3 oz (74.5 kg)  07/28/21 159 lb 8 oz (72.3 kg)     She has an appt with endocrinologist... 01/18/22.   Hypertension:  Blood pressure at goal on Losartan hydrochlorothiazide 50/12.5 mg 1 tablet daily BP Readings from Last 3 Encounters:  12/01/21 120/86  09/07/21 123/90  07/28/21 102/80  Using medication without problems or lightheadedness:  none Chest pain with exertion:none Edema:none Short of breath:none Average home BPs: Other issues:      Relevant past medical, surgical, family and social history reviewed and updated as indicated. Interim medical  history since our last visit reviewed. Allergies and medications reviewed and updated. Outpatient Medications Prior to Visit  Medication Sig Dispense Refill   acetaminophen (TYLENOL) 500 MG tablet Take 1,000 mg by mouth 2 (two) times daily as needed for moderate pain.     atorvastatin (LIPITOR) 40 MG tablet TAKE 1 TABLET(40 MG) BY MOUTH DAILY 90 tablet 3   Blood Glucose Monitoring Suppl (ONE TOUCH ULTRA SYSTEM KIT) w/Device KIT Use to check blood sugar one to two times daily.  Dx: E11.9 1 each 0   cyclobenzaprine (FLEXERIL) 5 MG tablet Take 5 mg by mouth 3 (three) times daily as needed for muscle spasms.     glucose blood (ONE TOUCH ULTRA TEST) test strip Use to check blood sugar one to two times daily.  Dx: E11.9 200 each 3   insulin glargine (LANTUS SOLOSTAR) 100 UNIT/ML Solostar Pen Inject 32 Units into the skin daily. 15 mL 5   Insulin Pen Needle 31G X 8 MM MISC Use to inject Victoza daily.  Dx: E11.65 90 each 3   losartan-hydrochlorothiazide (HYZAAR) 50-12.5 MG tablet TAKE 1 TABLET BY MOUTH DAILY 90 tablet 0   metFORMIN (GLUCOPHAGE) 1000 MG tablet TAKE 1 TABLET BY MOUTH TWICE DAILY WITH A MEAL 180 tablet 0   Multiple Vitamin (MULTIVITAMIN WITH MINERALS) TABS tablet Take 2 tablets by mouth daily with lunch.     ondansetron [facility]  ODT) 4 MG disintegrating tablet Take 1 tablet (4 mg total) by mouth every 8 (eight) hours as needed for nausea or vomiting. 12 tablet 0   OneTouch Delica Lancets 58N MISC Use to check blood sugar one to two times daily.  Dx: E11.9 200 each 3   pantoprazole (PROTONIX) 40 MG tablet Take 1 tablet (40 mg total) by mouth daily. 90 tablet 3   venlafaxine XR (EFFEXOR-XR) 37.5 MG 24 hr capsule TAKE 1 CAPSULE(37.5 MG) BY MOUTH DAILY 90 capsule 1   No facility-administered medications prior to visit.     Per HPI unless specifically indicated in ROS section below Review of Systems  Constitutional:  Negative for fatigue and fever.  HENT:  Negative for congestion.   Eyes:   Negative for pain.  Respiratory:  Negative for cough and shortness of breath.   Cardiovascular:  Negative for chest pain, palpitations and leg swelling.  Gastrointestinal:  Negative for abdominal pain.  Genitourinary:  Negative for dysuria and vaginal bleeding.  Musculoskeletal:  Negative for back pain.  Neurological:  Negative for syncope, light-headedness and headaches.  Psychiatric/Behavioral:  Negative for dysphoric mood.    Objective:  BP 120/86   Pulse (!) 103   Temp 98.3 F (36.8 C) (Oral)   Ht 5' 5.5" (1.664 m)   Wt 158 lb 8 oz (71.9 kg)   LMP 10/12/2017 Comment: upreg - neg  SpO2 97%   BMI 25.97 kg/m   Wt Readings from Last 3 Encounters:  12/01/21 158 lb 8 oz (71.9 kg)  09/07/21 164 lb 5.3 oz (74.5 kg)  07/28/21 159 lb 8 oz (72.3 kg)      Physical Exam Constitutional:      General: She is not in acute distress.    Appearance: Normal appearance. She is well-developed. She is not ill-appearing or toxic-appearing.  HENT:     Head: Normocephalic.     Right Ear: Hearing, tympanic membrane, ear canal and external ear normal. Tympanic membrane is not erythematous, retracted or bulging.     Left Ear: Hearing, tympanic membrane, ear canal and external ear normal. Tympanic membrane is not erythematous, retracted or bulging.     Nose: No mucosal edema or rhinorrhea.     Right Sinus: No maxillary sinus tenderness or frontal sinus tenderness.     Left Sinus: No maxillary sinus tenderness or frontal sinus tenderness.     Mouth/Throat:     Pharynx: Uvula midline.  Eyes:     General: Lids are normal. Lids are everted, no foreign bodies appreciated.     Conjunctiva/sclera: Conjunctivae normal.     Pupils: Pupils are equal, round, and reactive to light.  Neck:     Thyroid: No thyroid mass or thyromegaly.     Vascular: No carotid bruit.     Trachea: Trachea normal.  Cardiovascular:     Rate and Rhythm: Normal rate and regular rhythm.     Pulses: Normal pulses.     Heart  sounds: Normal heart sounds, S1 normal and S2 normal. No murmur heard.    No friction rub. No gallop.  Pulmonary:     Effort: Pulmonary effort is normal. No tachypnea or respiratory distress.     Breath sounds: Normal breath sounds. No decreased breath sounds, wheezing, rhonchi or rales.  Abdominal:     General: Bowel sounds are normal.     Palpations: Abdomen is soft.     Tenderness: There is no abdominal tenderness.  Musculoskeletal:     Cervical back: Normal  range of motion and neck supple.  Skin:    General: Skin is warm and dry.     Findings: No rash.  Neurological:     Mental Status: She is alert.  Psychiatric:        Mood and Affect: Mood is not anxious or depressed.        Speech: Speech normal.        Behavior: Behavior normal. Behavior is cooperative.        Thought Content: Thought content normal.        Judgment: Judgment normal.       Results for orders placed or performed in visit on 12/01/21  POCT glycosylated hemoglobin (Hb A1C)  Result Value Ref Range   Hemoglobin A1C 11.6 (A) 4.0 - 5.6 %   HbA1c POC (<> result, manual entry)     HbA1c, POC (prediabetic range)     HbA1c, POC (controlled diabetic range)       COVID 19 screen:  No recent travel or known exposure to COVID19 The patient denies respiratory symptoms of COVID 19 at this time. The importance of social distancing was discussed today.   Assessment and Plan Problem List Items Addressed This Visit     Hypertension associated with diabetes ([place])    Stable, chronic.  Continue current medication.   Losartan hydrochlorothiazide 50/12.5 mg daily      Poorly controlled type 2 diabetes mellitus ([place]) - Primary    Chronic and adequate control.  She has been working hard on regular exercise and low carbohydrate diet.  We did discuss fruits that are high in sugar today.  There has been no improvement in her A1c despite a slight increase in her glargine insulin.  She has an appointment with endocrinology  in the end of September.  I will have her increase her glargine by 10 units to 48 units daily given fasting blood sugars are not at goal.  She is not interested in increasing the metformin to max at this time given concern over possible side effects.  Of note in the past Jardiance did not seem to make a difference with her blood sugars.  She did not tolerate Victoza given it caused significant nausea and vomiting.      Relevant Orders   POCT glycosylated hemoglobin (Hb A1C) (Completed)   Other Visit Diagnoses     Need for shingles vaccine       Relevant Orders   Zoster Recombinant (Shingrix ) (Completed)          Eliezer Lofts, MD

## 2021-12-01 NOTE — Assessment & Plan Note (Signed)
Stable, chronic.  Continue current medication.   Losartan hydrochlorothiazide 50/12.5 mg daily

## 2021-12-01 NOTE — Patient Instructions (Addendum)
Continue  low carb diet and regular exercise.  Increase the insulin long acting to 48 Units daily.  Keep appt as planned with endocrinologist for improved control.

## 2021-12-01 NOTE — Assessment & Plan Note (Signed)
Chronic and adequate control.  She has been working hard on regular exercise and low carbohydrate diet.  We did discuss fruits that are high in sugar today.  There has been no improvement in her A1c despite a slight increase in her glargine insulin.  She has an appointment with endocrinology in the end of September.  I will have her increase her glargine by 10 units to 48 units daily given fasting blood sugars are not at goal.  She is not interested in increasing the metformin to max at this time given concern over possible side effects.  Of note in the past Jardiance did not seem to make a difference with her blood sugars.  She did not tolerate Victoza given it caused significant nausea and vomiting.

## 2021-12-08 DIAGNOSIS — M25562 Pain in left knee: Secondary | ICD-10-CM | POA: Diagnosis not present

## 2021-12-08 DIAGNOSIS — M1712 Unilateral primary osteoarthritis, left knee: Secondary | ICD-10-CM | POA: Diagnosis not present

## 2021-12-14 DIAGNOSIS — M1712 Unilateral primary osteoarthritis, left knee: Secondary | ICD-10-CM | POA: Insufficient documentation

## 2022-01-17 NOTE — Progress Notes (Signed)
Name: Alyssa Warren  MRN/ DOB: [id number], 12-22-1970   Age/ Sex: 51 y.o., female    PCP: Jinny Sanders, MD   Reason for Endocrinology Evaluation: Type 2 Diabetes Mellitus     Date of Initial Endocrinology Visit: 01/18/2022     PATIENT IDENTIFIER: Alyssa Warren is a 51 y.o. female with a past medical history of T2DM, HTN and Dyslipidemia . The patient presented for initial endocrinology clinic visit on 01/18/2022 for consultative assistance with her diabetes management.    HPI: Alyssa Warren was    Diagnosed with Gestational DM 2001,and  2009 by 2014 she was diagnosed with T2DM Prior Medications tried/Intolerance: Victoza - N/V . Jardiance - persistent hyperglycemia  Currently checking blood sugars 1-2 x / day Hypoglycemia episodes : no                Hemoglobin A1c has ranged from 7.0% in 2016, peaking at 11.6% in 2023. Patient required assistance for hypoglycemia:  Patient has required hospitalization within the last 1 year from hyper or hypoglycemia: no   In terms of diet, the patient 2 meals and snacks , drinks soda   She had vomiting for ~ 2 yrs due to Victoza, rare vomiting since than     HOME DIABETES REGIMEN: Metformin 1000 mg BID  Semglee 38 units daily       Statin: yes ACE-I/ARB: yes Prior Diabetic Education: yes    METER DOWNLOAD SUMMARY: Date range evaluated: 9/12-9/25/2023 Fingerstick Blood Glucose Tests = 11 Overall Mean FS Glucose = 228 Standard Deviation = 71  BG Ranges: Low = 165 High = 394   Hypoglycemic Events/30 Days: BG < 50 = 0 Episodes of symptomatic severe hypoglycemia = 0   DIABETIC COMPLICATIONS: Microvascular complications:   Denies: CKD, neuropathy  Last eye exam: Completed 01/2021  Macrovascular complications:   Denies: CAD, PVD, CVA   PAST HISTORY: Past Medical History:  Past Medical History:  Diagnosis Date   Allergy    Arthritis    LEFT KNEE   Diabetes mellitus without complication (HCC)     GERD (gastroesophageal reflux disease)    OCC   History of kidney stones    H/O   Hypertension    MVP (mitral valve prolapse)    PCOS (polycystic ovarian syndrome)    Past Surgical History:  Past Surgical History:  Procedure Laterality Date   BREAST BIOPSY Right 02/2012   x2. benign   COLONOSCOPY WITH PROPOFOL N/A 09/07/2021   Procedure: COLONOSCOPY WITH PROPOFOL;  Surgeon: Jonathon Bellows, MD;  Location: [facility] ENDOSCOPY;  Service: Gastroenterology;  Laterality: N/A;   cyst removed from ovary     CYSTOSCOPY  11/01/2017   Procedure: CYSTOSCOPY;  Surgeon: Gae Dry, MD;  Location: [facility] ORS;  Service: Gynecology;;   [place] N/A 12/23/2016   Procedure: DILATATION & CURETTAGE/HYSTEROSCOPY ENDOMETRIAL ABLATION-MINERVA;  Surgeon: Gae Dry, MD;  Location: [facility] ORS;  Service: Gynecology;  Laterality: N/A;   ESOPHAGOGASTRODUODENOSCOPY (EGD) WITH PROPOFOL N/A 09/01/2020   Procedure: ESOPHAGOGASTRODUODENOSCOPY (EGD) WITH PROPOFOL;  Surgeon: Jonathon Bellows, MD;  Location: [facility] ENDOSCOPY;  Service: Gastroenterology;  Laterality: N/A;   KNEE SURGERY Left    LABIOPLASTY N/A 07/12/2017   Procedure: EXCISION OF LABIAL CYST;  Surgeon: Gae Dry, MD;  Location: [facility] ORS;  Service: Gynecology;  Laterality: N/A;   LAPAROSCOPIC HYSTERECTOMY Bilateral 11/01/2017   Procedure: HYSTERECTOMY TOTAL LAPAROSCOPIC BILATERAL SALPINGECTOMY, RIGHT OVARIAN CYSTECTOMY;  Surgeon: Gae Dry, MD;  Location: [facility]  ORS;  Service: Gynecology;  Laterality: Bilateral;   LAPAROSCOPIC SALPINGO OOPHERECTOMY Left 12/23/2016   Procedure: LAPAROSCOPIC SALPINGO OOPHORECTOMY;  Surgeon: Gae Dry, MD;  Location: [facility] ORS;  Service: Gynecology;  Laterality: Left;   plantar fasciitis release Bilateral    ROTATOR CUFF REPAIR Right    TUBAL LIGATION      Social History:  reports that she has been smoking cigarettes. She has a 3.75 pack-year smoking history. She has  never used smokeless tobacco. She reports that she does not drink alcohol and does not use drugs. Family History:  Family History  Problem Relation Age of Onset   Breast cancer Maternal Aunt        great Aunts    Breast cancer Paternal Aunt    Heart attack Brother      HOME MEDICATIONS: Allergies as of 01/18/2022       Reactions   Penicillins Hives, Itching   Has patient had a PCN reaction causing immediate rash, facial/tongue/throat swelling, SOB or lightheadedness with hypotension: Yes Has patient had a PCN reaction causing severe rash involving mucus membranes or skin necrosis: No Has patient had a PCN reaction that required hospitalization: No Has patient had a PCN reaction occurring within the last 10 years: No If all of the above answers are "NO", then may proceed with Cephalosporin use.   Keflex [cephalexin] Other (See Comments)   Makes loopy and feels like passing out   Methyldopa Other (See Comments)   Feels like passing out         Medication List        Accurate as of January 18, 2022 10:42 AM. If you have any questions, ask your nurse or doctor.          STOP taking these medications    Lantus SoloStar 100 UNIT/ML Solostar Pen Generic drug: insulin glargine Stopped by: Dorita Sciara, MD       TAKE these medications    acetaminophen 500 MG tablet Commonly known as: TYLENOL Take 1,000 mg by mouth 2 (two) times daily as needed for moderate pain.   atorvastatin 40 MG tablet Commonly known as: LIPITOR TAKE 1 TABLET(40 MG) BY MOUTH DAILY   cyclobenzaprine 5 MG tablet Commonly known as: FLEXERIL Take 5 mg by mouth 3 (three) times daily as needed for muscle spasms.   glucose blood test strip Commonly known as: ONE TOUCH ULTRA TEST Use to check blood sugar one to two times daily.  Dx: E11.9   Insulin Pen Needle 31G X 8 MM Misc Use to inject Victoza daily.  Dx: E11.65   losartan-hydrochlorothiazide 50-12.5 MG tablet Commonly known as:  HYZAAR TAKE 1 TABLET BY MOUTH DAILY   metFORMIN 1000 MG tablet Commonly known as: GLUCOPHAGE TAKE 1 TABLET BY MOUTH TWICE DAILY WITH A MEAL   multivitamin with minerals Tabs tablet Take 2 tablets by mouth daily with lunch.   ondansetron 4 MG disintegrating tablet Commonly known as: Zofran ODT Take 1 tablet (4 mg total) by mouth every 8 (eight) hours as needed for nausea or vomiting.   ONE TOUCH ULTRA SYSTEM KIT w/Device Kit Use to check blood sugar one to two times daily.  Dx: G99.2   OneTouch Delica Lancets 42A Misc Use to check blood sugar one to two times daily.  Dx: E11.9   pantoprazole 40 MG tablet Commonly known as: PROTONIX Take 1 tablet (40 mg total) by mouth daily.   Semglee (yfgn) 100 UNIT/ML Pen Generic drug: insulin glargine-yfgn 38 Units  at bedtime.   venlafaxine XR 37.5 MG 24 hr capsule Commonly known as: EFFEXOR-XR TAKE 1 CAPSULE(37.5 MG) BY MOUTH DAILY         ALLERGIES: Allergies  Allergen Reactions   Penicillins Hives and Itching    Has patient had a PCN reaction causing immediate rash, facial/tongue/throat swelling, SOB or lightheadedness with hypotension: Yes Has patient had a PCN reaction causing severe rash involving mucus membranes or skin necrosis: No Has patient had a PCN reaction that required hospitalization: No Has patient had a PCN reaction occurring within the last 10 years: No If all of the above answers are "NO", then may proceed with Cephalosporin use.    Keflex [Cephalexin] Other (See Comments)    Makes loopy and feels like passing out   Methyldopa Other (See Comments)    Feels like passing out      REVIEW OF SYSTEMS: A comprehensive ROS was conducted with the patient and is negative except as per HPI and below:  Review of Systems  Gastrointestinal:  Positive for vomiting.       Vomiting episode  last weekend   Musculoskeletal:  Positive for joint pain.  Neurological:  Positive for tingling.       Has plantar fascittis  with left plantar point numbness       OBJECTIVE:   VITAL SIGNS: BP 130/76 (BP Location: Left Arm, Patient Position: Sitting, Cuff Size: Small)   Pulse 93   Ht 5' 5.5" (1.664 m)   Wt 159 lb (72.1 kg)   LMP 10/12/2017 Comment: upreg - neg  SpO2 99%   BMI 26.06 kg/m    PHYSICAL EXAM:  General: Pt appears well and is in NAD  Neck: General: Supple without adenopathy or carotid bruits. Thyroid: Thyroid size normal.  No goiter or nodules appreciated.   Lungs: Clear with good BS bilat with no rales, rhonchi, or wheezes  Heart: RRR   Abdomen:  soft, nontender  Extremities:  Lower extremities - No pretibial edema. No lesions.  Neuro: MS is good with appropriate affect, pt is alert and Ox3    DM foot exam: 01/18/2022  The skin of the feet is intact without sores or ulcerations. The pedal pulses are 2+ on right and 2+ on left. The sensation is decreased  to a screening 5.07, 10 gram monofilament at the right heel   DATA REVIEWED:  Lab Results  Component Value Date   HGBA1C 11.6 (A) 12/01/2021   HGBA1C 11.3 (H) 07/21/2021   HGBA1C 10.5 (H) 07/29/2020   Lab Results  Component Value Date   LDLCALC 26 06/29/2013   CREATININE 0.72 07/21/2021   No results found for: "MICRALBCREAT"  Lab Results  Component Value Date   CHOL 235 (H) 07/21/2021   HDL 42.40 07/21/2021   LDLCALC 26 06/29/2013   LDLDIRECT 116.0 07/21/2021   TRIG (H) 07/21/2021    515.0 Triglyceride is over 400; calculations on Lipids are invalid.   CHOLHDL 6 07/21/2021        ASSESSMENT / PLAN / RECOMMENDATIONS:   1) Type 2 Diabetes Mellitus, Poorly controlled, With neuropathic  complications - Most recent A1c of 11.6 %. Goal A1c < 7.0 %.     I have advised the patient to avoid sugar sweetened beverages as well as limit snacking, also discussed low-carb snack if needed We discussed CGM technology but she is concerned about the cost, as she is already having a high co-pay with the rest of her medication  including insulin I will  try switching Semglee to [employer], I have asked her to obtain coupons from [place] care website We will increase basal insulin as below Patient has been noted with postprandial hyperglycemia, she was given the option of prandial insulin but due to hardship we have opted to try glipizide first Intolerant to Victoza due to vomiting, she understands that this is a side effect that could happen when any of the rest of the GLP-1 agonist We will consider restarting him on Jardiance in the future    MEDICATIONS: -Metformin 1000 mg twice daily -Increase Semglee/Basaglar 42 units once daily -Glipizide 5 mg, 1 tablet before breakfast and supper  EDUCATION / INSTRUCTIONS: BG monitoring instructions: Patient is instructed to check her blood sugars 2 times a day, fasting and supper time . Call [facility] if: BG persistently < 70  I reviewed the Rule of 15 for the treatment of hypoglycemia in detail with the patient. Literature supplied.   2) Diabetic complications:  Eye: Does not have known diabetic retinopathy.  Neuro/ Feet: Does  have known diabetic peripheral neuropathy following a foot sx Renal: Patient does not have known baseline CKD. She is  on an ACEI/ARB at present.     3) Mixed Dyslipidemia:   - She has been noted with hypertriglyceridemia with a max 1595 mg/dL in 2017 - LDL aboave goal at 116 mg/dL  - Fenofibrate caused joint pains  - Discussed cardiovascular benefits of statin therapy   Continue  Start Fish oil 1200 mg daily  Continue Atorvastatin 40 mg daily    Signed electronically by: Mack Guise, MD  [facility] End[facility]in[facility]  [address] [place],  [address] Phone: [phone] FAX: [phone]   CC: Jinny Sanders, MD [place] [address] Phone: [phone]  Fax: [phone]    Return to Endocrinology clinic as below: Future  Appointments  Date Time Provider [place]  01/18/2022 10:50 AM Mayan Dolney, Melanie Crazier, MD [id number] None

## 2022-01-18 ENCOUNTER — Ambulatory Visit: Payer: BC Managed Care – PPO | Admitting: Internal Medicine

## 2022-01-18 ENCOUNTER — Encounter: Payer: Self-pay | Admitting: Internal Medicine

## 2022-01-18 VITALS — BP 130/76 | HR 93 | Ht 65.5 in | Wt 159.0 lb

## 2022-01-18 DIAGNOSIS — E782 Mixed hyperlipidemia: Secondary | ICD-10-CM | POA: Diagnosis not present

## 2022-01-18 DIAGNOSIS — E1165 Type 2 diabetes mellitus with hyperglycemia: Secondary | ICD-10-CM | POA: Insufficient documentation

## 2022-01-18 DIAGNOSIS — E1141 Type 2 diabetes mellitus with diabetic mononeuropathy: Secondary | ICD-10-CM

## 2022-01-18 DIAGNOSIS — Z794 Long term (current) use of insulin: Secondary | ICD-10-CM | POA: Diagnosis not present

## 2022-01-18 MED ORDER — METFORMIN HCL 1000 MG PO TABS: 1000.0000 mg | ORAL_TABLET | Freq: Two times a day (BID) | ORAL | 3 refills | Status: DC

## 2022-01-18 MED ORDER — PEN NEEDLES 31G X 5 MM MISC
1.0000 | Freq: Every day | 3 refills | Status: DC
Start: 2022-01-18 — End: 2023-09-23

## 2022-01-18 MED ORDER — BASAGLAR KWIKPEN 100 UNIT/ML ~~LOC~~ SOPN: 42.0000 [IU] | PEN_INJECTOR | Freq: Every day | SUBCUTANEOUS | 6 refills | Status: DC

## 2022-01-18 MED ORDER — GLIPIZIDE 5 MG PO TABS: 5.0000 mg | ORAL_TABLET | Freq: Two times a day (BID) | ORAL | 3 refills | Status: DC

## 2022-01-18 NOTE — Patient Instructions (Addendum)
Continue Metformin 1000 mg twice daily  Increase Semglee ( I will try switching this to Basaglar)  to 42 units daily  Start Glipizide 5 mg, 1 tablet before Breakfast and 1 tablet before Supper    HOW TO TREAT LOW BLOOD SUGARS (Blood sugar LESS THAN 70 MG/DL) Please follow the RULE OF 15 for the treatment of hypoglycemia treatment (when your (blood sugars are less than 70 mg/dL)   STEP 1: Take 15 grams of carbohydrates when your blood sugar is low, which includes:  3-4 GLUCOSE TABS  OR 3-4 OZ OF JUICE OR REGULAR SODA OR ONE TUBE OF GLUCOSE GEL    STEP 2: RECHECK blood sugar in 15 MINUTES STEP 3: If your blood sugar is still low at the 15 minute recheck --> then, go back to STEP 1 and treat AGAIN with another 15 grams of carbohydrates.

## 2022-01-28 LAB — HM DIABETES EYE EXAM

## 2022-02-02 ENCOUNTER — Other Ambulatory Visit: Payer: Self-pay | Admitting: Family Medicine

## 2022-02-02 DIAGNOSIS — R002 Palpitations: Secondary | ICD-10-CM

## 2022-02-18 ENCOUNTER — Other Ambulatory Visit: Payer: Self-pay | Admitting: Family Medicine

## 2022-02-18 DIAGNOSIS — Z1231 Encounter for screening mammogram for malignant neoplasm of breast: Secondary | ICD-10-CM

## 2022-03-23 ENCOUNTER — Ambulatory Visit
Admission: RE | Admit: 2022-03-23 | Discharge: 2022-03-23 | Disposition: A | Discharge status: Home / Self Care | Payer: BC Managed Care – PPO | Source: Ambulatory Visit | Attending: Family Medicine | Admitting: Family Medicine

## 2022-03-23 DIAGNOSIS — Z1231 Encounter for screening mammogram for malignant neoplasm of breast: Secondary | ICD-10-CM | POA: Diagnosis not present

## 2022-05-02 ENCOUNTER — Other Ambulatory Visit: Payer: Self-pay | Admitting: Family Medicine

## 2022-05-03 ENCOUNTER — Other Ambulatory Visit: Payer: Self-pay

## 2022-05-03 ENCOUNTER — Encounter: Payer: Self-pay | Admitting: Internal Medicine

## 2022-05-03 MED ORDER — BASAGLAR KWIKPEN 100 UNIT/ML ~~LOC~~ SOPN: 42.0000 [IU] | PEN_INJECTOR | Freq: Every day | SUBCUTANEOUS | 6 refills | Status: DC

## 2022-05-03 MED ORDER — INSULIN GLARGINE-YFGN 100 UNIT/ML ~~LOC~~ SOPN: 42.0000 [IU] | PEN_INJECTOR | Freq: Every day | SUBCUTANEOUS | 6 refills | Status: DC

## 2022-05-18 ENCOUNTER — Other Ambulatory Visit: Payer: Self-pay | Admitting: Family Medicine

## 2022-06-04 ENCOUNTER — Ambulatory Visit: Payer: BC Managed Care – PPO | Admitting: Internal Medicine

## 2022-07-02 ENCOUNTER — Encounter: Payer: Self-pay | Admitting: Internal Medicine

## 2022-07-02 ENCOUNTER — Ambulatory Visit: Payer: BC Managed Care – PPO | Admitting: Internal Medicine

## 2022-07-02 VITALS — BP 124/80 | HR 76 | Ht 65.5 in | Wt 163.8 lb

## 2022-07-02 DIAGNOSIS — E1165 Type 2 diabetes mellitus with hyperglycemia: Secondary | ICD-10-CM | POA: Diagnosis not present

## 2022-07-02 DIAGNOSIS — Z794 Long term (current) use of insulin: Secondary | ICD-10-CM | POA: Diagnosis not present

## 2022-07-02 DIAGNOSIS — E1142 Type 2 diabetes mellitus with diabetic polyneuropathy: Secondary | ICD-10-CM

## 2022-07-02 LAB — POCT GLYCOSYLATED HEMOGLOBIN (HGB A1C): Hemoglobin A1C: 9.9 % — AB (ref 4.0–5.6)

## 2022-07-02 MED ORDER — EMPAGLIFLOZIN 10 MG PO TABS: 10.0000 mg | ORAL_TABLET | Freq: Every day | ORAL | 3 refills | Status: DC

## 2022-07-02 MED ORDER — INSULIN GLARGINE-YFGN 100 UNIT/ML ~~LOC~~ SOPN: 32.0000 [IU] | PEN_INJECTOR | Freq: Every day | SUBCUTANEOUS | 6 refills | Status: DC

## 2022-07-02 MED ORDER — GLIPIZIDE 10 MG PO TABS: 10.0000 mg | ORAL_TABLET | Freq: Two times a day (BID) | ORAL | 3 refills | Status: DC

## 2022-07-02 MED ORDER — METFORMIN HCL 1000 MG PO TABS: 1000.0000 mg | ORAL_TABLET | Freq: Two times a day (BID) | ORAL | 3 refills | Status: DC

## 2022-07-02 NOTE — Patient Instructions (Signed)
Continue Metformin 1000 mg twice daily  Decrease  Semglee to 32  units , once daily  Increase Glipizide 10 mg, 1 tablet before Breakfast and 1 tablet before Supper Start Jardiance 10 mg, 1 tablet every morning     HOW TO TREAT LOW BLOOD SUGARS (Blood sugar LESS THAN 70 MG/DL) Please follow the RULE OF 15 for the treatment of hypoglycemia treatment (when your (blood sugars are less than 70 mg/dL)   STEP 1: Take 15 grams of carbohydrates when your blood sugar is low, which includes:  3-4 GLUCOSE TABS  OR 3-4 OZ OF JUICE OR REGULAR SODA OR ONE TUBE OF GLUCOSE GEL    STEP 2: RECHECK blood sugar in 15 MINUTES STEP 3: If your blood sugar is still low at the 15 minute recheck --> then, go back to STEP 1 and treat AGAIN with another 15 grams of carbohydrates.

## 2022-07-02 NOTE — Progress Notes (Signed)
Name: Alyssa Warren  MRN/ DOB: [id number], 1971/02/06   Age/ Sex: 52 y.o., female    PCP: Jinny Sanders, MD   Reason for Endocrinology Evaluation: Type 2 Diabetes Mellitus     Date of Initial Endocrinology Visit: 01/18/2022    PATIENT IDENTIFIER: Alyssa Warren is a 52 y.o. female with a past medical history of T2DM, HTN and Dyslipidemia . The patient presented for initial endocrinology clinic visit on 01/18/2022 for consultative assistance with her diabetes management.    HPI: Alyssa Warren was    Diagnosed with Gestational DM 2001,and  2009 by 2014 she was diagnosed with T2DM Prior Medications tried/Intolerance: Victoza - N/V . Jardiance - persistent hyperglycemia             Hemoglobin A1c has ranged from 7.0% in 2016, peaking at 11.6% in 2023.  She had vomiting for ~ 2 yrs due to Victoza, rare vomiting since than    On her initial visit to our clinic she had an A1c of 11.6%, she was on metformin, and insulin, due to hardship on the patient we opted to add glipizide and increase her basal insulin   SUBJECTIVE:   During the last visit (01/18/2022): A1c 11.6%  Today (07/02/22): Alyssa Warren is here for follow-up on diabetes management .she checks her blood sugars occasionally  times daily. The patient has had hypoglycemic episodes since the last clinic visit. She was symptomatic with the episode  Feet pain has resolved  Denies vomiting but has mild nausea , has GERD   HOME DIABETES REGIMEN: Metformin 1000 mg BID  Glipizide 5 mg twice daily Semglee 42 units daily  Fish oil 1200 mg daily Atorvastatin 40 mg daily     Statin: yes ACE-I/ARB: yes Prior Diabetic Education: yes    METER DOWNLOAD SUMMARY: Date range evaluated: 2/24-07/02/2022 Fingerstick Blood Glucose Tests = 4 Overall Mean FS Glucose = 210 Standard Deviation = 33  BG Ranges: Low = 161 High = 234   Hypoglycemic Events/30 Days: BG < 50 = 0 Episodes of symptomatic severe  hypoglycemia = 0   DIABETIC COMPLICATIONS: Microvascular complications:   Denies: CKD, neuropathy  Last eye exam: Completed 01/2021  Macrovascular complications:   Denies: CAD, PVD, CVA   PAST HISTORY: Past Medical History:  Past Medical History:  Diagnosis Date   Allergy    Arthritis    LEFT KNEE   Diabetes mellitus without complication (HCC)    GERD (gastroesophageal reflux disease)    OCC   History of kidney stones    H/O   Hypertension    MVP (mitral valve prolapse)    PCOS (polycystic ovarian syndrome)    Past Surgical History:  Past Surgical History:  Procedure Laterality Date   BREAST BIOPSY Right 02/2012   x2. benign   COLONOSCOPY WITH PROPOFOL N/A 09/07/2021   Procedure: COLONOSCOPY WITH PROPOFOL;  Surgeon: Jonathon Bellows, MD;  Location: [facility] ENDOSCOPY;  Service: Gastroenterology;  Laterality: N/A;   cyst removed from ovary     CYSTOSCOPY  11/01/2017   Procedure: CYSTOSCOPY;  Surgeon: Gae Dry, MD;  Location: [facility] ORS;  Service: Gynecology;;   [place] N/A 12/23/2016   Procedure: DILATATION & CURETTAGE/HYSTEROSCOPY ENDOMETRIAL ABLATION-MINERVA;  Surgeon: Gae Dry, MD;  Location: [facility] ORS;  Service: Gynecology;  Laterality: N/A;   ESOPHAGOGASTRODUODENOSCOPY (EGD) WITH PROPOFOL N/A 09/01/2020   Procedure: ESOPHAGOGASTRODUODENOSCOPY (EGD) WITH PROPOFOL;  Surgeon: Jonathon Bellows, MD;  Location: [facility] ENDOSCOPY;  Service: Gastroenterology;  Laterality:  N/A;   KNEE SURGERY Left    LABIOPLASTY N/A 07/12/2017   Procedure: EXCISION OF LABIAL CYST;  Surgeon: Gae Dry, MD;  Location: [facility] ORS;  Service: Gynecology;  Laterality: N/A;   LAPAROSCOPIC HYSTERECTOMY Bilateral 11/01/2017   Procedure: HYSTERECTOMY TOTAL LAPAROSCOPIC BILATERAL SALPINGECTOMY, RIGHT OVARIAN CYSTECTOMY;  Surgeon: Gae Dry, MD;  Location: [facility] ORS;  Service: Gynecology;  Laterality: Bilateral;   LAPAROSCOPIC SALPINGO OOPHERECTOMY Left  12/23/2016   Procedure: LAPAROSCOPIC SALPINGO OOPHORECTOMY;  Surgeon: Gae Dry, MD;  Location: [facility] ORS;  Service: Gynecology;  Laterality: Left;   plantar fasciitis release Bilateral    ROTATOR CUFF REPAIR Right    TUBAL LIGATION      Social History:  reports that she has been smoking cigarettes. She has a 3.75 pack-year smoking history. She has never used smokeless tobacco. She reports that she does not drink alcohol and does not use drugs. Family History:  Family History  Problem Relation Age of Onset   Breast cancer Maternal Aunt        great Aunts    Breast cancer Paternal Aunt    Heart attack Brother      HOME MEDICATIONS: Allergies as of 07/02/2022       Reactions   Penicillins Hives, Itching   Has patient had a PCN reaction causing immediate rash, facial/tongue/throat swelling, SOB or lightheadedness with hypotension: Yes Has patient had a PCN reaction causing severe rash involving mucus membranes or skin necrosis: No Has patient had a PCN reaction that required hospitalization: No Has patient had a PCN reaction occurring within the last 10 years: No If all of the above answers are "NO", then may proceed with Cephalosporin use.   Keflex [cephalexin] Other (See Comments)   Makes loopy and feels like passing out   Methyldopa Other (See Comments)   Feels like passing out         Medication List        Accurate as of July 02, 2022  2:26 PM. If you have any questions, ask your nurse or doctor.          acetaminophen 500 MG tablet Commonly known as: TYLENOL Take 1,000 mg by mouth 2 (two) times daily as needed for moderate pain.   atorvastatin 40 MG tablet Commonly known as: LIPITOR TAKE 1 TABLET(40 MG) BY MOUTH DAILY   cyclobenzaprine 5 MG tablet Commonly known as: FLEXERIL Take 5 mg by mouth 3 (three) times daily as needed for muscle spasms.   glipiZIDE 5 MG tablet Commonly known as: GLUCOTROL Take 1 tablet (5 mg total) by mouth 2 (two) times daily  before a meal.   glucose blood test strip Commonly known as: ONE TOUCH ULTRA TEST Use to check blood sugar one to two times daily.  Dx: E11.9   insulin glargine-yfgn 100 UNIT/ML Pen Commonly known as: SEMGLEE Inject 42 Units into the skin daily.   losartan-hydrochlorothiazide 50-12.5 MG tablet Commonly known as: HYZAAR TAKE 1 TABLET BY MOUTH DAILY   metFORMIN 1000 MG tablet Commonly known as: GLUCOPHAGE Take 1 tablet (1,000 mg total) by mouth 2 (two) times daily with a meal.   multivitamin with minerals Tabs tablet Take 2 tablets by mouth daily with lunch.   ondansetron 4 MG disintegrating tablet Commonly known as: Zofran ODT Take 1 tablet (4 mg total) by mouth every 8 (eight) hours as needed for nausea or vomiting.   ONE TOUCH ULTRA SYSTEM KIT w/Device Kit Use to check blood sugar one to two  times daily.  Dx: XX123456   OneTouch Delica Lancets [id number] Misc Use to check blood sugar one to two times daily.  Dx: E11.9   pantoprazole 40 MG tablet Commonly known as: PROTONIX Take 1 tablet (40 mg total) by mouth daily.   Pen Needles 31G X 5 MM Misc 1 Device by Does not apply route daily in the afternoon.   venlafaxine XR 37.5 MG 24 hr capsule Commonly known as: EFFEXOR-XR TAKE 1 CAPSULE(37.5 MG) BY MOUTH DAILY         ALLERGIES: Allergies  Allergen Reactions   Penicillins Hives and Itching    Has patient had a PCN reaction causing immediate rash, facial/tongue/throat swelling, SOB or lightheadedness with hypotension: Yes Has patient had a PCN reaction causing severe rash involving mucus membranes or skin necrosis: No Has patient had a PCN reaction that required hospitalization: No Has patient had a PCN reaction occurring within the last 10 years: No If all of the above answers are "NO", then may proceed with Cephalosporin use.    Keflex [Cephalexin] Other (See Comments)    Makes loopy and feels like passing out   Methyldopa Other (See Comments)    Feels like passing  out      REVIEW OF SYSTEMS: A comprehensive ROS was conducted with the patient and is negative except as per HPI   OBJECTIVE:   VITAL SIGNS: BP 124/80 (BP Location: Left Arm, Patient Position: Sitting, Cuff Size: Small)   Pulse 76   Ht 5' 5.5" (1.664 m)   Wt 163 lb 12.8 oz (74.3 kg)   LMP 10/12/2017 Comment: upreg - neg  SpO2 99%   BMI 26.84 kg/m    PHYSICAL EXAM:  General: Pt appears well and is in NAD  Lungs: Clear with good BS bilat   Heart: RRR   Abdomen:  soft, nontender  Extremities:  Lower extremities - No pretibial edema. No lesions.  Neuro: MS is good with appropriate affect, pt is alert and Ox3    DM foot exam: 01/18/2022  The skin of the feet is intact without sores or ulcerations. The pedal pulses are 2+ on right and 2+ on left. The sensation is decreased  to a screening 5.07, 10 gram monofilament at the right heel   DATA REVIEWED:  Lab Results  Component Value Date   HGBA1C 9.9 (A) 07/02/2022   HGBA1C 11.6 (A) 12/01/2021   HGBA1C 11.3 (H) 07/21/2021   Lab Results  Component Value Date   LDLCALC 26 06/29/2013   CREATININE 0.72 07/21/2021   No results found for: "MICRALBCREAT"  Lab Results  Component Value Date   CHOL 235 (H) 07/21/2021   HDL 42.40 07/21/2021   LDLCALC 26 06/29/2013   LDLDIRECT 116.0 07/21/2021   TRIG (H) 07/21/2021    515.0 Triglyceride is over 400; calculations on Lipids are invalid.   CHOLHDL 6 07/21/2021        ASSESSMENT / PLAN / RECOMMENDATIONS:   1) Type 2 Diabetes Mellitus, Poorly controlled, With neuropathic  complications - Most recent A1c of 9.9%. Goal A1c < 7.0 %.    I have praised the patient on improved glycemic control, A1c down from 11.6% to 9.9% She continues with occasional sugar sweetened beverages, we again discussed the importance of avoiding all sugar sweetened beverages She has been able to get the Semglee to a coupon with no co-pay  intolerant to Victoza due to severe vomiting We have discussed  restarting Jardiance at a small dose, I have encouraged hydration  I have recommended increasing glipizide as below and decreasing Semglee   MEDICATIONS: -Metformin 1000 mg twice daily -Decrease Semglee 32 units once daily -Increase Glipizide 10 mg, 1 tablet before breakfast and supper  EDUCATION / INSTRUCTIONS: BG monitoring instructions: Patient is instructed to check her blood sugars 2 times a day, fasting and supper time . Call [facility] if: BG persistently < 70  I reviewed the Rule of 15 for the treatment of hypoglycemia in detail with the patient. Literature supplied.   2) Diabetic complications:  Eye: Does not have known diabetic retinopathy.  Neuro/ Feet: Does  have known diabetic peripheral neuropathy following a foot sx Renal: Patient does not have known baseline CKD. She is  on an ACEI/ARB at present.     3) Mixed Dyslipidemia:   - She has been noted with hypertriglyceridemia with a max 1595 mg/dL in 2017 - LDL above goal at 116 mg/dL  - Fenofibrate caused joint pains  -I have encouraged her on the last visit to start fish oil   Continue  Fish oil 1200 mg daily   Atorvastatin 40 mg daily   Follow-up in 4 months   Signed electronically by: Mack Guise, MD  [facility]  [address] [address] [place], [place] [address] Phone: [phone] FAX: [phone]   CC: Jinny Sanders, MD [place] [address] Phone: [phone]  Fax: [phone]    Return to Endocrinology clinic as below: No future appointments.

## 2022-07-28 ENCOUNTER — Other Ambulatory Visit: Payer: Self-pay | Admitting: Family Medicine

## 2022-07-28 NOTE — Telephone Encounter (Signed)
Please schedule CPE with fasting labs prior with Dr. Bedsole.  

## 2022-07-29 NOTE — Telephone Encounter (Signed)
Patient will cb to schedule

## 2022-08-12 ENCOUNTER — Other Ambulatory Visit: Payer: Self-pay | Admitting: Family Medicine

## 2022-08-12 NOTE — Telephone Encounter (Signed)
Spoke to pt, scheduled cpe for 09/16/22 

## 2022-08-12 NOTE — Telephone Encounter (Signed)
Please call and schedule CPE with fasting labs prior with Dr. Bedsole. 

## 2022-09-16 ENCOUNTER — Encounter: Payer: BC Managed Care – PPO | Admitting: Family Medicine

## 2022-10-30 ENCOUNTER — Other Ambulatory Visit: Payer: Self-pay | Admitting: Family Medicine

## 2022-10-31 ENCOUNTER — Other Ambulatory Visit: Payer: Self-pay | Admitting: Family Medicine

## 2022-11-03 ENCOUNTER — Encounter: Payer: BC Managed Care – PPO | Admitting: Family Medicine

## 2022-12-03 ENCOUNTER — Ambulatory Visit (INDEPENDENT_AMBULATORY_CARE_PROVIDER_SITE_OTHER): Payer: BC Managed Care – PPO | Admitting: Family Medicine

## 2022-12-03 ENCOUNTER — Encounter: Payer: Self-pay | Admitting: Family Medicine

## 2022-12-03 VITALS — BP 118/90 | HR 105 | Temp 97.2°F | Ht 65.75 in | Wt 157.1 lb

## 2022-12-03 DIAGNOSIS — L608 Other nail disorders: Secondary | ICD-10-CM

## 2022-12-03 DIAGNOSIS — Z0000 Encounter for general adult medical examination without abnormal findings: Secondary | ICD-10-CM | POA: Diagnosis not present

## 2022-12-03 DIAGNOSIS — E785 Hyperlipidemia, unspecified: Secondary | ICD-10-CM | POA: Diagnosis not present

## 2022-12-03 DIAGNOSIS — E1165 Type 2 diabetes mellitus with hyperglycemia: Secondary | ICD-10-CM

## 2022-12-03 DIAGNOSIS — E1169 Type 2 diabetes mellitus with other specified complication: Secondary | ICD-10-CM | POA: Diagnosis not present

## 2022-12-03 DIAGNOSIS — I152 Hypertension secondary to endocrine disorders: Secondary | ICD-10-CM

## 2022-12-03 DIAGNOSIS — E1159 Type 2 diabetes mellitus with other circulatory complications: Secondary | ICD-10-CM

## 2022-12-03 DIAGNOSIS — Z7984 Long term (current) use of oral hypoglycemic drugs: Secondary | ICD-10-CM

## 2022-12-03 DIAGNOSIS — Z7985 Long-term (current) use of injectable non-insulin antidiabetic drugs: Secondary | ICD-10-CM

## 2022-12-03 DIAGNOSIS — Z794 Long term (current) use of insulin: Secondary | ICD-10-CM

## 2022-12-03 LAB — COMPREHENSIVE METABOLIC PANEL
ALT: 17 U/L (ref 0–35)
AST: 14 U/L (ref 0–37)
Albumin: 4.4 g/dL (ref 3.5–5.2)
Alkaline Phosphatase: 66 U/L (ref 39–117)
BUN: 13 mg/dL (ref 6–23)
CO2: 30 mEq/L (ref 19–32)
Calcium: 10 mg/dL (ref 8.4–10.5)
Chloride: 101 mEq/L (ref 96–112)
Creatinine, Ser: 0.7 mg/dL (ref 0.40–1.20)
GFR: 99.12 mL/min (ref >60.00)
Glucose, Bld: 193 mg/dL — ABNORMAL HIGH (ref 70–99)
Potassium: 4 mEq/L (ref 3.5–5.1)
Sodium: 140 mEq/L (ref 135–145)
Total Bilirubin: 0.9 mg/dL (ref 0.2–1.2)
Total Protein: 6.8 g/dL (ref 6.0–8.3)

## 2022-12-03 LAB — MICROALBUMIN / CREATININE URINE RATIO
Creatinine,U: 97.9 mg/dL
Microalb Creat Ratio: 2.2 mg/g (ref 0.0–30.0)
Microalb, Ur: 2.2 mg/dL — ABNORMAL HIGH (ref 0.0–1.9)

## 2022-12-03 LAB — HEMOGLOBIN A1C: Hgb A1c MFr Bld: 9.7 % — ABNORMAL HIGH (ref 4.6–6.5)

## 2022-12-03 LAB — LIPID PANEL
Cholesterol: 242 mg/dL — ABNORMAL HIGH (ref 0–200)
HDL: 39.7 mg/dL (ref >39.00)
Total CHOL/HDL Ratio: 6
Triglycerides: 544 mg/dL — ABNORMAL HIGH (ref 0.0–149.0)

## 2022-12-03 LAB — LDL CHOLESTEROL, DIRECT: Direct LDL: 117 mg/dL

## 2022-12-03 NOTE — Patient Instructions (Signed)
Make appt for follow up with ENDO as planned in 12/2022

## 2022-12-03 NOTE — Progress Notes (Signed)
Patient ID: Alyssa Warren, female    DOB: 11-04-1970, 52 y.o.   MRN: [id number]  This visit was conducted in person.  BP (!) 120/90 (BP Location: Left Arm, Patient Position: Sitting, Cuff Size: Normal)   Pulse (!) 105   Temp (!) 97.2 F (36.2 C) (Temporal)   Ht 5' 5.75" (1.67 m)   Wt 157 lb 2 oz (71.3 kg)   LMP 10/12/2017 Comment: upreg - neg  SpO2 97%   BMI 25.55 kg/m    CC:  Chief Complaint  Patient presents with   Annual Exam    Subjective:   HPI: Alyssa Warren is a 52 y.o. female presenting on 12/03/2022 for Annual Exam  Diabetes:   Poor control in past...  due for re-eval.... Reviewed office visit note from Endo Dr. Doreatha Martin left for January 18, 2022 and July 02, 2022 ... Now on Jardiance 10 mg p.o. daily Metformin 1000 mg BID Glipizide 10 mg p.o. twice daily  On Semglee 32 Units.  Intolerant of victoza... emesis Lab Results  Component Value Date   HGBA1C 9.9 (A) 07/02/2022  Using medications without difficulties: Hypoglycemic episodes: none Hyperglycemic episodes: not checking. Feet problems: none Blood Sugars averaging: not checking lately as machine died eye exam within last year:  done.. requested report  Wt Readings from Last 3 Encounters:  12/03/22 157 lb 2 oz (71.3 kg)  07/02/22 163 lb 12.8 oz (74.3 kg)  01/18/22 159 lb (72.1 kg)     Elevated Cholesterol: Inadequate control on atorvastatin 40 mg daily. Using medications without problems: Muscle aches:  Diet compliance: occ Exercise: poor Other complaints:  Hypertension:   Previously well-controlled on losartan hydrochlorothiazide 50/12.5 mg daily... on recheck no change... she will follow at home.. call if > 140/90 BP Readings from Last 3 Encounters:  12/03/22 (!) 120/90  07/02/22 124/80  01/18/22 130/76  Using medication without problems or lightheadedness:  none Chest pain with exertion: none Edema:none Short of breath: none Average home BPs: not checking Other issues:      Relevant past medical, surgical, family and social history reviewed and updated as indicated. Interim medical history since our last visit reviewed. Allergies and medications reviewed and updated. Outpatient Medications Prior to Visit  Medication Sig Dispense Refill   acetaminophen (TYLENOL) 500 MG tablet Take 1,000 mg by mouth 2 (two) times daily as needed for moderate pain.     atorvastatin (LIPITOR) 40 MG tablet TAKE 1 TABLET(40 MG) BY MOUTH DAILY 90 tablet 3   Blood Glucose Monitoring Suppl (ONE TOUCH ULTRA SYSTEM KIT) w/Device KIT Use to check blood sugar one to two times daily.  Dx: E11.9 1 each 0   empagliflozin (JARDIANCE) 10 MG TABS tablet Take 1 tablet (10 mg total) by mouth daily. 90 tablet 3   glipiZIDE (GLUCOTROL) 10 MG tablet Take 1 tablet (10 mg total) by mouth 2 (two) times daily before a meal. 180 tablet 3   glucose blood (ONE TOUCH ULTRA TEST) test strip Use to check blood sugar one to two times daily.  Dx: E11.9 200 each 3   insulin glargine-yfgn (SEMGLEE) 100 UNIT/ML Pen Inject 32 Units into the skin daily. 30 mL 6   Insulin Pen Needle (PEN NEEDLES) 31G X 5 MM MISC 1 Device by Does not apply route daily in the afternoon. 100 each 3   losartan-hydrochlorothiazide (HYZAAR) 50-12.5 MG tablet TAKE 1 TABLET BY MOUTH DAILY 90 tablet 3   metFORMIN (GLUCOPHAGE) 1000 MG tablet Take 1 tablet (1,000  mg total) by mouth 2 (two) times daily with a meal. 180 tablet 3   Multiple Vitamin (MULTIVITAMIN WITH MINERALS) TABS tablet Take 2 tablets by mouth daily with lunch.     ondansetron (ZOFRAN ODT) 4 MG disintegrating tablet Take 1 tablet (4 mg total) by mouth every 8 (eight) hours as needed for nausea or vomiting. 12 tablet 0   OneTouch Delica Lancets 33G MISC Use to check blood sugar one to two times daily.  Dx: E11.9 200 each 3   pantoprazole (PROTONIX) 40 MG tablet TAKE 1 TABLET(40 MG) BY MOUTH DAILY 90 tablet 0   venlafaxine XR (EFFEXOR-XR) 37.5 MG 24 hr capsule TAKE 1 CAPSULE(37.5 MG)  BY MOUTH DAILY 90 capsule 0   cyclobenzaprine (FLEXERIL) 5 MG tablet Take 5 mg by mouth 3 (three) times daily as needed for muscle spasms.     No facility-administered medications prior to visit.     Per HPI unless specifically indicated in ROS section below Review of Systems  Constitutional:  Negative for fatigue and fever.  HENT:  Negative for congestion.   Eyes:  Negative for pain.  Respiratory:  Negative for cough and shortness of breath.   Cardiovascular:  Negative for chest pain, palpitations and leg swelling.  Gastrointestinal:  Negative for abdominal pain.  Genitourinary:  Negative for dysuria and vaginal bleeding.  Musculoskeletal:  Negative for back pain.  Neurological:  Negative for syncope, light-headedness and headaches.  Psychiatric/Behavioral:  Negative for dysphoric mood.    Objective:  BP (!) 120/90 (BP Location: Left Arm, Patient Position: Sitting, Cuff Size: Normal)   Pulse (!) 105   Temp (!) 97.2 F (36.2 C) (Temporal)   Ht 5' 5.75" (1.67 m)   Wt 157 lb 2 oz (71.3 kg)   LMP 10/12/2017 Comment: upreg - neg  SpO2 97%   BMI 25.55 kg/m   Wt Readings from Last 3 Encounters:  12/03/22 157 lb 2 oz (71.3 kg)  07/02/22 163 lb 12.8 oz (74.3 kg)  01/18/22 159 lb (72.1 kg)      Physical Exam Constitutional:      General: She is not in acute distress.    Appearance: Normal appearance. She is well-developed. She is not ill-appearing or toxic-appearing.  HENT:     Head: Normocephalic.     Right Ear: Hearing, tympanic membrane, ear canal and external ear normal. Tympanic membrane is not erythematous, retracted or bulging.     Left Ear: Hearing, tympanic membrane, ear canal and external ear normal. Tympanic membrane is not erythematous, retracted or bulging.     Nose: No mucosal edema or rhinorrhea.     Right Sinus: No maxillary sinus tenderness or frontal sinus tenderness.     Left Sinus: No maxillary sinus tenderness or frontal sinus tenderness.     Mouth/Throat:      Pharynx: Uvula midline.  Eyes:     General: Lids are normal. Lids are everted, no foreign bodies appreciated.     Conjunctiva/sclera: Conjunctivae normal.     Pupils: Pupils are equal, round, and reactive to light.  Neck:     Thyroid: No thyroid mass or thyromegaly.     Vascular: No carotid bruit.     Trachea: Trachea normal.  Cardiovascular:     Rate and Rhythm: Normal rate and regular rhythm.     Pulses: Normal pulses.     Heart sounds: Normal heart sounds, S1 normal and S2 normal. No murmur heard.    No friction rub. No gallop.  Pulmonary:  Effort: Pulmonary effort is normal. No tachypnea or respiratory distress.     Breath sounds: Normal breath sounds. No decreased breath sounds, wheezing, rhonchi or rales.  Abdominal:     General: Bowel sounds are normal.     Palpations: Abdomen is soft.     Tenderness: There is no abdominal tenderness.  Musculoskeletal:     Cervical back: Normal range of motion and neck supple.  Skin:    General: Skin is warm and dry.     Findings: No rash.  Neurological:     Mental Status: She is alert.  Psychiatric:        Mood and Affect: Mood is not anxious or depressed.        Speech: Speech normal.        Behavior: Behavior normal. Behavior is cooperative.        Thought Content: Thought content normal.        Judgment: Judgment normal.     Diabetic foot exam: Normal inspection No skin breakdown No calluses  Normal DP pulses Normal sensation to light touch and monofilament Nails  right great toe nail.. pincer type.. no redness  Some periungual debris.     Results for orders placed or performed in visit on 07/02/22  POCT glycosylated hemoglobin (Hb A1C)  Result Value Ref Range   Hemoglobin A1C 9.9 (A) 4.0 - 5.6 %   HbA1c POC (<> result, manual entry)     HbA1c, POC (prediabetic range)     HbA1c, POC (controlled diabetic range)      This visit occurred during the SARS-CoV-2 public health emergency.  Safety protocols were in  place, including screening questions prior to the visit, additional usage of staff PPE, and extensive cleaning of exam room while observing appropriate contact time as indicated for disinfecting solutions.   COVID 19 screen:  No recent travel or known exposure to COVID19 The patient denies respiratory symptoms of COVID 19 at this time. The importance of social distancing was discussed today.   Assessment and Plan   The patient's preventative maintenance and recommended screening tests for an annual wellness exam were reviewed in full today. Brought up to date unless services declined.  Counselled on the importance of diet, exercise, and its role in overall health and mortality. The patient's FH and SH was reviewed, including their home life, tobacco status, and drug and alcohol status.  . Vaccine:  Due for Td, PNA.Marland Kitchen   Completed shingrix.  Discussed COVID19 vaccine side effects and benefits. Strongly encouraged the patient to get the vaccine. Questions answered. Mammo:02/2022 Dr Tiburcio Pea GYN PAP/DVE: nml pap 2015, has had 3 normals in a row, on q3 year schedule.    Last  pap done 10/2016 ASCUS..s/p partial hysterectomy...  followed by GYN. Smoker, refused med to help with cessation... Has cut down to 8-9 a day.      HIV: refused  Colon: 09/07/2021 Dr. Tobi Bastos, repeat in 10 years.  Problem List Items Addressed This Visit     Hyperlipidemia associated with type 2 diabetes mellitus (HCC) (Chronic)    Chronic, due for reevaluation   atorvastatin 40 mg daily.      Relevant Orders   Comprehensive metabolic panel   Lipid panel   Hypertension associated with diabetes (HCC)    Chronic.  Elevated in office today, on recheck no change.  She will follow blood pressure at home and call if persistently greater than 140/90   Losartan hydrochlorothiazide 50/12.5 mg daily  Poorly controlled type 2 diabetes mellitus (HCC)    Chronic, previously inadequate control but improving with assistance  of ENDO. Due for reevaluation  Has follow up palnned in 12/2022  Jardiance 10 mg p.o. daily Glipizide 10 mg twice daily On metfomrin 1000 mg 1 tablet 2 times daily  On Semglee 32 Units..  Intolerant of victoza... emesis       Relevant Orders   Comprehensive metabolic panel   Hemoglobin A1c   Lipid panel   Microalbumin / creatinine urine ratio   Other Visit Diagnoses     Routine general medical examination at a health care facility    -  Primary   Toenail deformity       Relevant Orders   Ambulatory referral to Podiatry      No orders of the defined types were placed in this encounter.  Problem List Items Addressed This Visit     Hyperlipidemia associated with type 2 diabetes mellitus (HCC) (Chronic)    Chronic, due for reevaluation   atorvastatin 40 mg daily.      Relevant Orders   Comprehensive metabolic panel   Lipid panel   Hypertension associated with diabetes (HCC)    Chronic.  Elevated in office today, on recheck no change.  She will follow blood pressure at home and call if persistently greater than 140/90   Losartan hydrochlorothiazide 50/12.5 mg daily      Poorly controlled type 2 diabetes mellitus (HCC)    Chronic, previously inadequate control but improving with assistance of ENDO. Due for reevaluation  Has follow up palnned in 12/2022  Jardiance 10 mg p.o. daily Glipizide 10 mg twice daily On metfomrin 1000 mg 1 tablet 2 times daily  On Semglee 32 Units..  Intolerant of victoza... emesis       Relevant Orders   Comprehensive metabolic panel   Hemoglobin A1c   Lipid panel   Microalbumin / creatinine urine ratio   Other Visit Diagnoses     Routine general medical examination at a health care facility    -  Primary   Toenail deformity       Relevant Orders   Ambulatory referral to Podiatry        Kerby Nora, MD

## 2022-12-03 NOTE — Assessment & Plan Note (Signed)
Chronic, due for reevaluation   atorvastatin 40 mg daily.

## 2022-12-03 NOTE — Assessment & Plan Note (Addendum)
Chronic.  Elevated in office today, on recheck no change.  She will follow blood pressure at home and call if persistently greater than 140/90   Losartan hydrochlorothiazide 50/12.5 mg daily

## 2022-12-03 NOTE — Assessment & Plan Note (Addendum)
Chronic, previously inadequate control but improving with assistance of ENDO. Due for reevaluation  Has follow up palnned in 12/2022  Jardiance 10 mg p.o. daily Glipizide 10 mg twice daily On metfomrin 1000 mg 1 tablet 2 times daily  On Semglee 32 Units..  Intolerant of victoza... emesis

## 2022-12-18 ENCOUNTER — Other Ambulatory Visit: Payer: Self-pay | Admitting: Family Medicine

## 2022-12-18 DIAGNOSIS — E781 Pure hyperglyceridemia: Secondary | ICD-10-CM

## 2023-01-23 ENCOUNTER — Other Ambulatory Visit: Payer: Self-pay | Admitting: Family Medicine

## 2023-01-23 DIAGNOSIS — R002 Palpitations: Secondary | ICD-10-CM

## 2023-02-01 ENCOUNTER — Ambulatory Visit: Payer: 59 | Admitting: Podiatry

## 2023-02-01 ENCOUNTER — Encounter: Payer: Self-pay | Admitting: Podiatry

## 2023-02-01 VITALS — BP 129/86 | HR 87

## 2023-02-01 DIAGNOSIS — L600 Ingrowing nail: Secondary | ICD-10-CM

## 2023-02-01 NOTE — Progress Notes (Signed)
Chief Complaint  Patient presents with   Diabetes    "I have a pincer toenail on my right big toe." N - pincer nail L - hallux right D - June  O - suddenly C - ingrown, sore, throb A - hit it T - tried to get them out myself    HPI: 52 y.o. female PMHx diabetes mellitus presenting today for evaluation of a symptomatic pincer nail to the right great toe.  She noticed this about 4-5 months ago.  Idiopathic onset.  She says it is very painful and tender especially when she bumps it against something.  Past Medical History:  Diagnosis Date   Allergy    Arthritis    LEFT KNEE   Diabetes mellitus without complication (HCC)    GERD (gastroesophageal reflux disease)    OCC   History of kidney stones    H/O   Hypertension    MVP (mitral valve prolapse)    PCOS (polycystic ovarian syndrome)     Past Surgical History:  Procedure Laterality Date   BREAST BIOPSY Right 02/2012   x2. benign   COLONOSCOPY WITH PROPOFOL N/A 09/07/2021   Procedure: COLONOSCOPY WITH PROPOFOL;  Surgeon: Wyline Mood, MD;  Location: [facility] ENDOSCOPY;  Service: Gastroenterology;  Laterality: N/A;   cyst removed from ovary     CYSTOSCOPY  11/01/2017   Procedure: CYSTOSCOPY;  Surgeon: Nadara Mustard, MD;  Location: [facility] ORS;  Service: Gynecology;;   DILITATION & CURRETTAGE/HYSTROSCOPY WITH NOVASURE ABLATION N/A 12/23/2016   Procedure: DILATATION & CURETTAGE/HYSTEROSCOPY ENDOMETRIAL ABLATION-MINERVA;  Surgeon: Nadara Mustard, MD;  Location: [facility] ORS;  Service: Gynecology;  Laterality: N/A;   ESOPHAGOGASTRODUODENOSCOPY (EGD) WITH PROPOFOL N/A 09/01/2020   Procedure: ESOPHAGOGASTRODUODENOSCOPY (EGD) WITH PROPOFOL;  Surgeon: Wyline Mood, MD;  Location: [facility] ENDOSCOPY;  Service: Gastroenterology;  Laterality: N/A;   KNEE SURGERY Left    LABIOPLASTY N/A 07/12/2017   Procedure: EXCISION OF LABIAL CYST;  Surgeon: Nadara Mustard, MD;  Location: [facility] ORS;  Service: Gynecology;  Laterality: N/A;   LAPAROSCOPIC  HYSTERECTOMY Bilateral 11/01/2017   Procedure: HYSTERECTOMY TOTAL LAPAROSCOPIC BILATERAL SALPINGECTOMY, RIGHT OVARIAN CYSTECTOMY;  Surgeon: Nadara Mustard, MD;  Location: [facility] ORS;  Service: Gynecology;  Laterality: Bilateral;   LAPAROSCOPIC SALPINGO OOPHERECTOMY Left 12/23/2016   Procedure: LAPAROSCOPIC SALPINGO OOPHORECTOMY;  Surgeon: Nadara Mustard, MD;  Location: [facility] ORS;  Service: Gynecology;  Laterality: Left;   plantar fasciitis release Bilateral    ROTATOR CUFF REPAIR Right    TUBAL LIGATION      Allergies  Allergen Reactions   Penicillins Hives and Itching    Has patient had a PCN reaction causing immediate rash, facial/tongue/throat swelling, SOB or lightheadedness with hypotension: Yes Has patient had a PCN reaction causing severe rash involving mucus membranes or skin necrosis: No Has patient had a PCN reaction that required hospitalization: No Has patient had a PCN reaction occurring within the last 10 years: No If all of the above answers are "NO", then may proceed with Cephalosporin use.    Keflex [Cephalexin] Other (See Comments)    Makes loopy and feels like passing out   Methyldopa Other (See Comments)    Feels like passing out      Physical Exam: General: The patient is alert and oriented x3 in no acute distress.  Dermatology: Skin is warm, dry and supple bilateral lower extremities.  Pincer nail deformity noted with ingrowing portions of nail to the medial and lateral aspect of the right great toe with associated  tenderness and pain  Vascular: Palpable pedal pulses bilaterally. Capillary refill within normal limits.  No appreciable edema.  No erythema.  Neurological: Grossly intact via light touch  Musculoskeletal Exam: No pedal deformities noted   Assessment/Plan of Care: 1.  Symptomatic pincer nail deformity right great toe  -Patient evaluated -After evaluating the patient and discussing I do believe it is appropriate to perform a total temporary nail  avulsion to the right hallux nail plate.  This was discussed in detail with the patient.  She agrees and would like to have the nail avulsed to allow the new nail to grow into for the potential for healthy regrowth -The toe was prepped in aseptic manner and digital block performed using 3 mL of 2% lidocaine plain.  The nail was avulsed in its entirety and dressings applied -Post care instructions provided -Return to clinic 3 weeks  *Works at [employer] PT     Felecia Shelling, DPM [facility]  Dr. Felecia Shelling, DPM    [address] [place], [place] [address]                Office [phone]  Fax [phone]

## 2023-02-06 ENCOUNTER — Other Ambulatory Visit: Payer: Self-pay | Admitting: Family Medicine

## 2023-02-10 ENCOUNTER — Other Ambulatory Visit: Payer: Self-pay | Admitting: Family Medicine

## 2023-02-10 MED ORDER — PANTOPRAZOLE SODIUM 40 MG PO TBEC: 40.0000 mg | DELAYED_RELEASE_TABLET | Freq: Every day | ORAL | 3 refills | Status: DC

## 2023-02-28 ENCOUNTER — Telehealth: Payer: Self-pay | Admitting: Podiatry

## 2023-02-28 NOTE — Telephone Encounter (Signed)
Pt cxled appt for tomorrow in [place] states foot/toe is doing well

## 2023-03-01 ENCOUNTER — Ambulatory Visit: Payer: BC Managed Care – PPO | Admitting: Podiatry

## 2023-05-27 ENCOUNTER — Telehealth: Payer: Self-pay | Admitting: Internal Medicine

## 2023-05-27 NOTE — Telephone Encounter (Signed)
Patient called and said her insurance changed and that they no longer cover Semglee but that they will cover Lantus and she wanted to know if that can be sent in instead. [id number] - [place], [place] - [address]  Patient said she only has 1 pen left of Semglee, enough to last her 4 or 5 days

## 2023-05-30 MED ORDER — LANTUS SOLOSTAR 100 UNIT/ML ~~LOC~~ SOPN: 32.0000 [IU] | PEN_INJECTOR | Freq: Every day | SUBCUTANEOUS | 4 refills | Status: DC

## 2023-07-19 ENCOUNTER — Ambulatory Visit: Payer: BC Managed Care – PPO | Admitting: Internal Medicine

## 2023-07-22 ENCOUNTER — Other Ambulatory Visit: Payer: Self-pay | Admitting: Family Medicine

## 2023-08-04 IMAGING — CR DG CHEST 2V
1 series · 2 of 2 positions shown · non-contrast
Comparison: Radiograph dated 05/31/2019.

CLINICAL DATA: Chest pain.

EXAM:
CHEST - 2 VIEW

[Series 1: dg chest 2 view · 0.14mm/px · 2 of 2 slices shown]
[im 1/2]
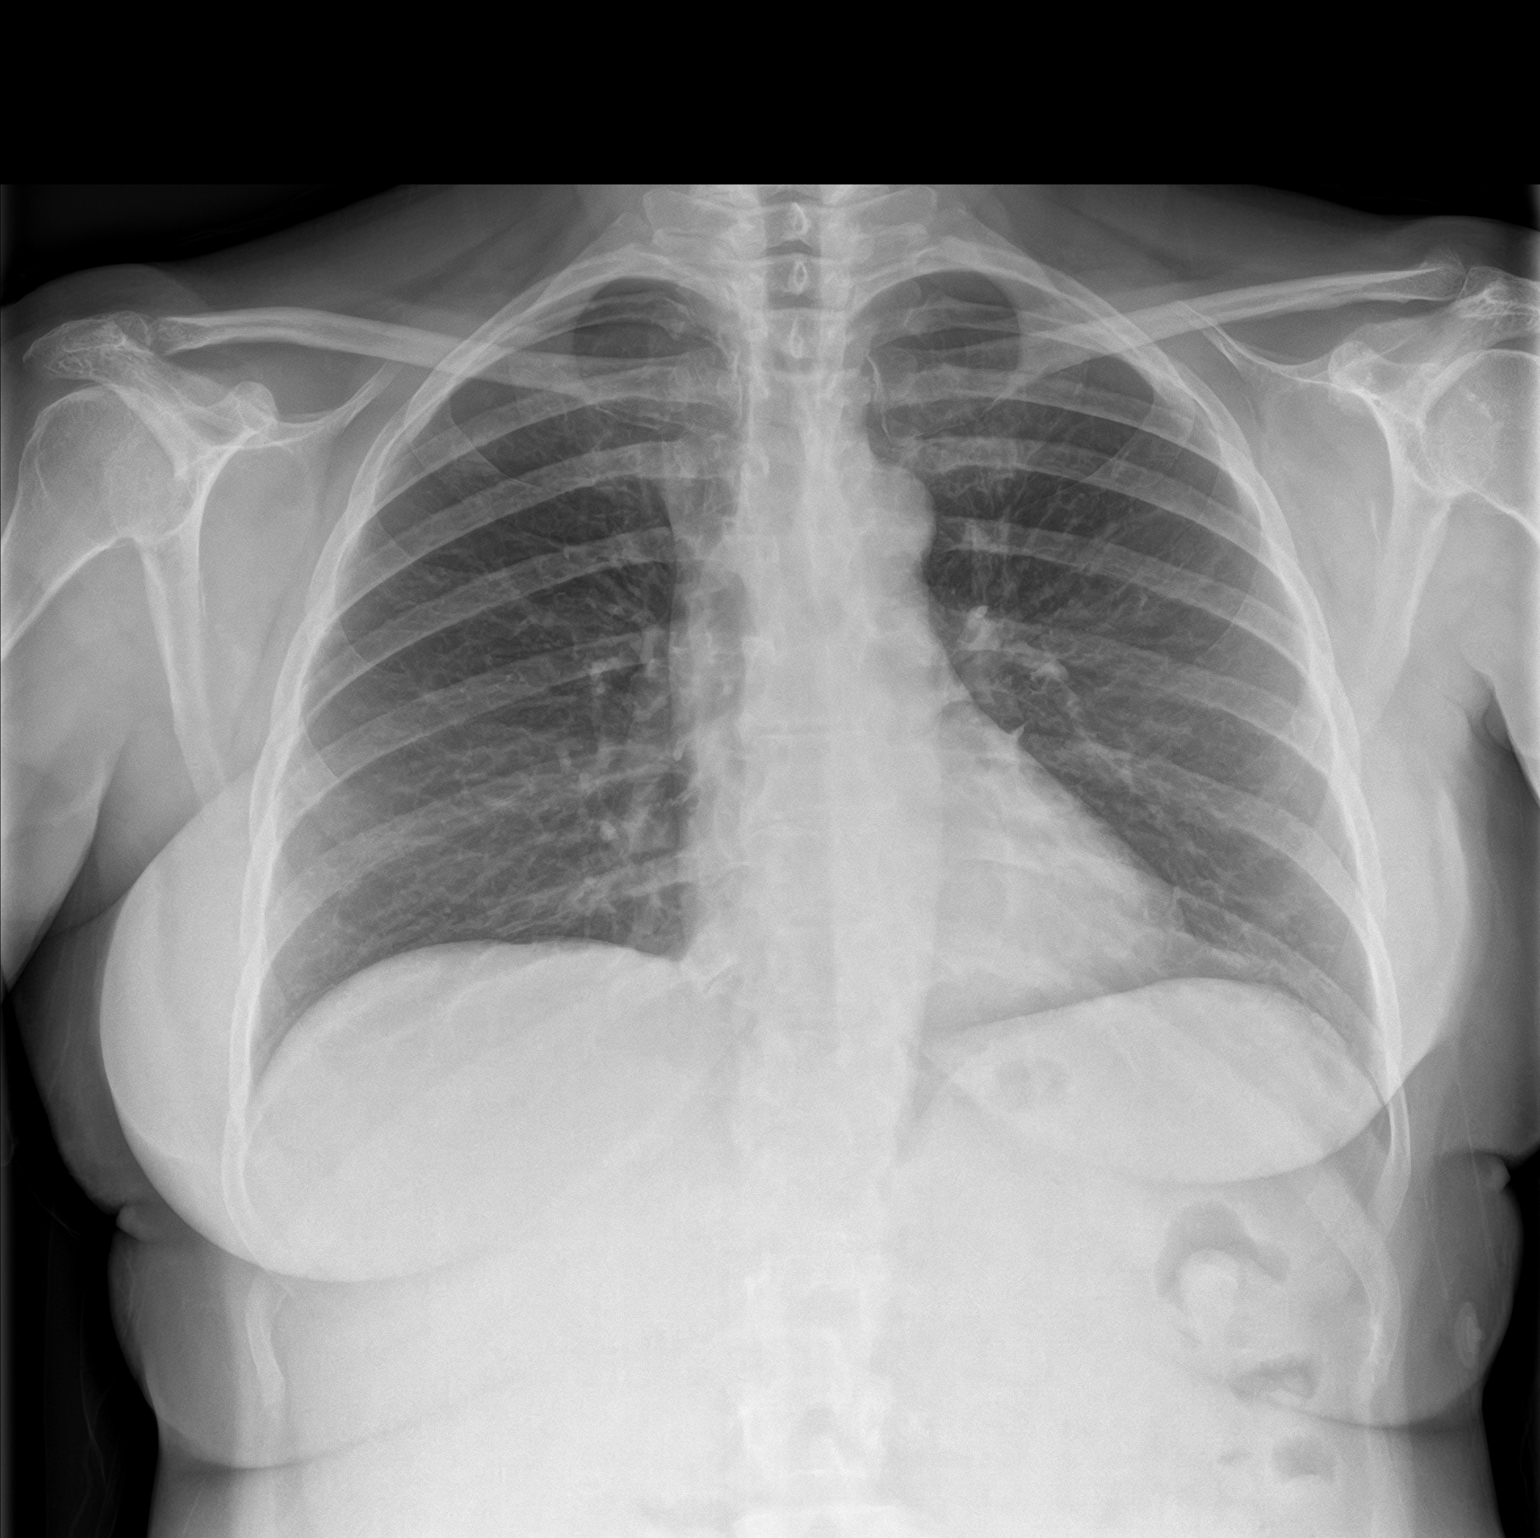
[im 2/2]
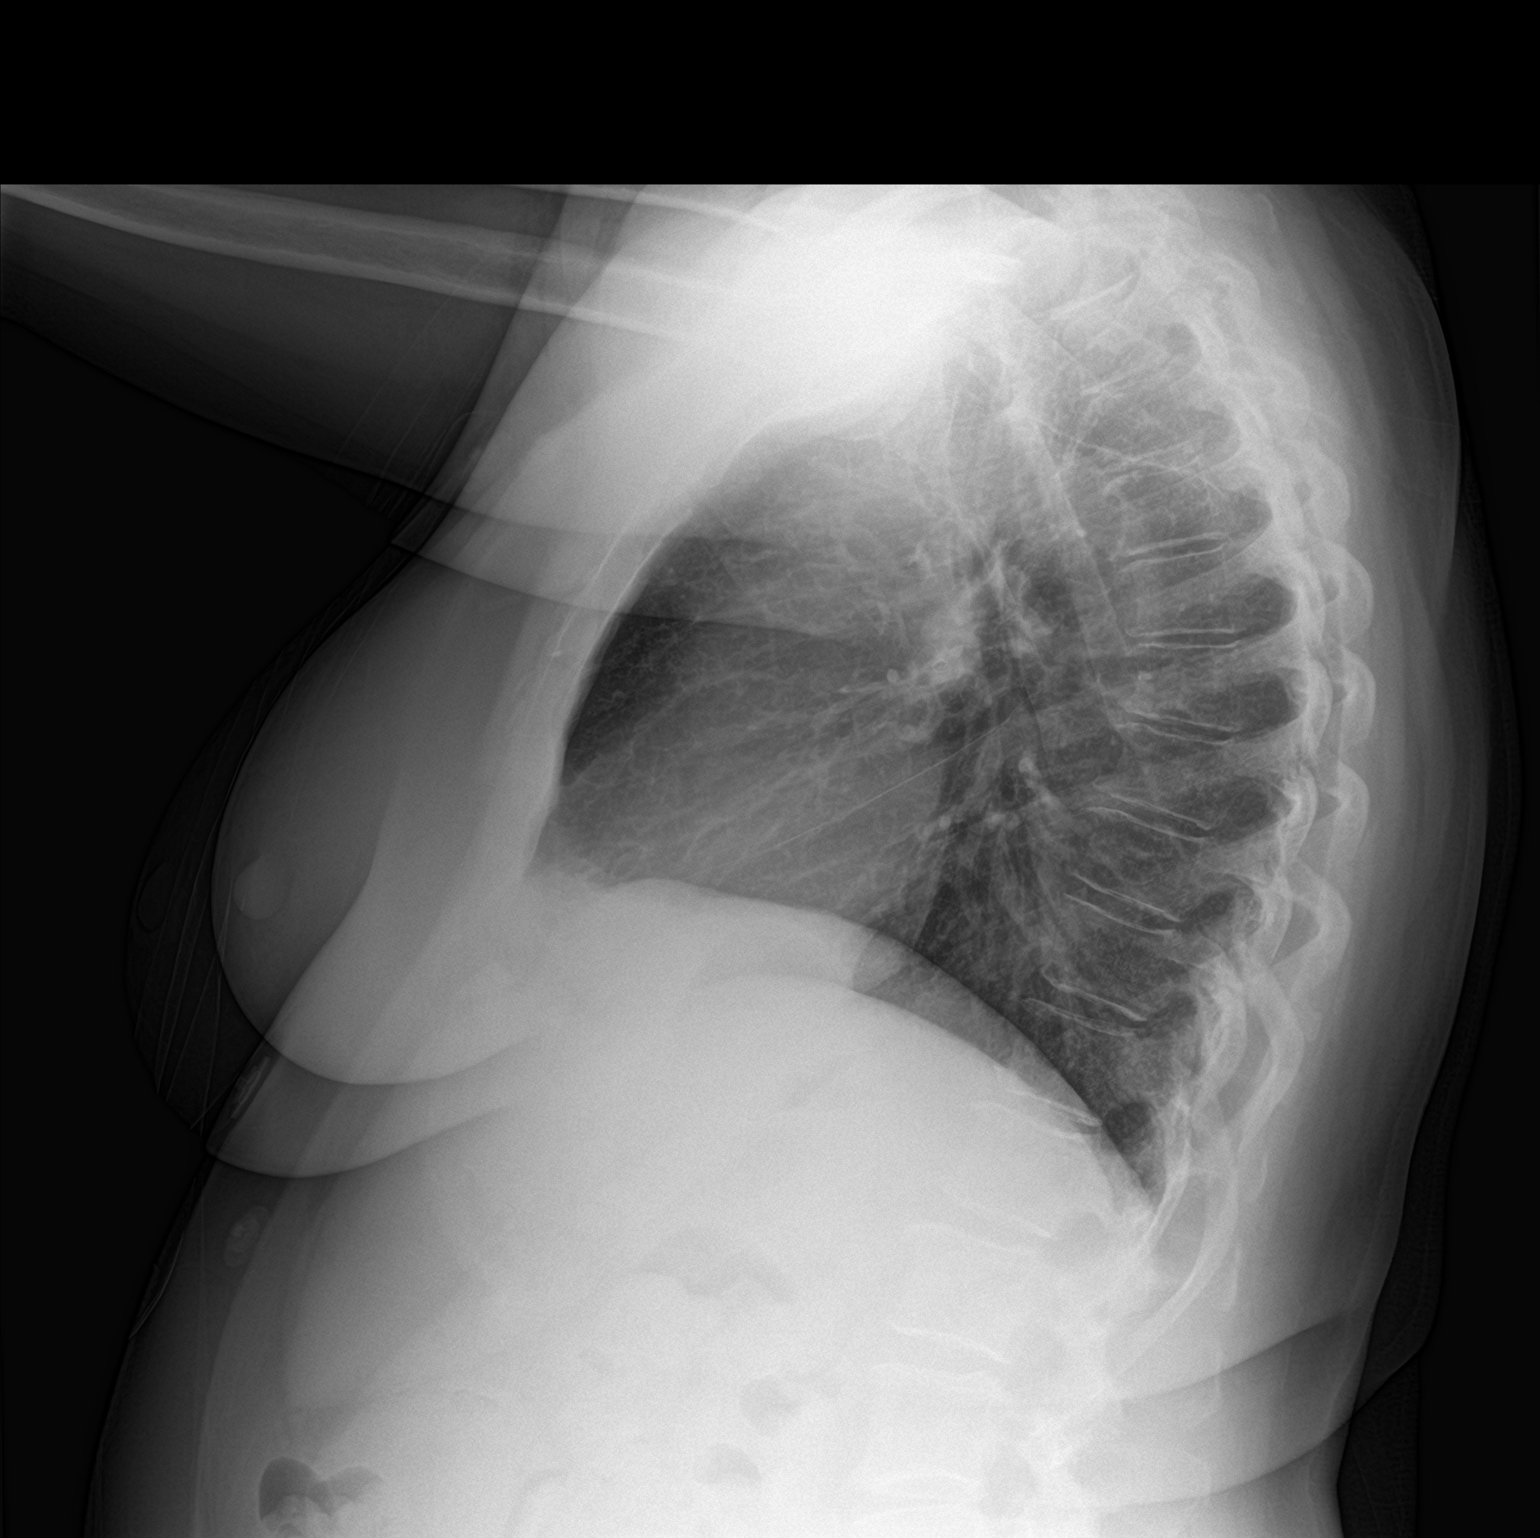

[2 of 2 positions shown; findings below may reference images not displayed]

FINDINGS: The heart size and mediastinal contours are within normal limits.
Both lungs are clear. The visualized skeletal structures are
unremarkable.
IMPRESSION: No active cardiopulmonary disease.

## 2023-08-10 ENCOUNTER — Encounter: Payer: Self-pay | Admitting: Family Medicine

## 2023-08-11 ENCOUNTER — Other Ambulatory Visit (HOSPITAL_COMMUNITY): Payer: Self-pay

## 2023-08-11 ENCOUNTER — Telehealth: Payer: Self-pay

## 2023-08-11 NOTE — Telephone Encounter (Signed)
 Pharmacy Patient Advocate Encounter   Received notification from Patient Advice Request messages that prior authorization for Pantoprazole Sodium 40MG  dr tablets is required/requested.   Insurance verification completed.   The patient is insured through [facility] .   Per test claim: PA required and submitted KEY/EOC/Request #: [facility] APPROVED from 08/11/23 to 08/10/24. Ran test claim, Copay is $20. This test claim was processed through [facility]- copay amounts may vary at other pharmacies due to pharmacy/plan contracts, or as the patient moves through the different stages of their insurance plan.

## 2023-09-23 ENCOUNTER — Encounter: Payer: Self-pay | Admitting: Internal Medicine

## 2023-09-23 ENCOUNTER — Ambulatory Visit: Admitting: Internal Medicine

## 2023-09-23 VITALS — BP 126/78 | HR 89 | Ht 65.75 in | Wt 159.0 lb

## 2023-09-23 DIAGNOSIS — E1142 Type 2 diabetes mellitus with diabetic polyneuropathy: Secondary | ICD-10-CM

## 2023-09-23 DIAGNOSIS — E782 Mixed hyperlipidemia: Secondary | ICD-10-CM | POA: Diagnosis not present

## 2023-09-23 DIAGNOSIS — E1165 Type 2 diabetes mellitus with hyperglycemia: Secondary | ICD-10-CM | POA: Diagnosis not present

## 2023-09-23 DIAGNOSIS — Z794 Long term (current) use of insulin: Secondary | ICD-10-CM

## 2023-09-23 LAB — POCT GLYCOSYLATED HEMOGLOBIN (HGB A1C): Hemoglobin A1C: 8.4 % — AB (ref 4.0–5.6)

## 2023-09-23 MED ORDER — DEXCOM G7 SENSOR MISC: 1.0000 | 3 refills | Status: AC

## 2023-09-23 MED ORDER — EMPAGLIFLOZIN 25 MG PO TABS: 25.0000 mg | ORAL_TABLET | Freq: Every day | ORAL | 3 refills | Status: AC

## 2023-09-23 MED ORDER — METFORMIN HCL 1000 MG PO TABS: 1000.0000 mg | ORAL_TABLET | Freq: Two times a day (BID) | ORAL | 3 refills | Status: AC

## 2023-09-23 MED ORDER — GLIPIZIDE 10 MG PO TABS: 20.0000 mg | ORAL_TABLET | Freq: Two times a day (BID) | ORAL | 3 refills | Status: AC

## 2023-09-23 MED ORDER — PEN NEEDLES 31G X 5 MM MISC: 1.0000 | Freq: Every day | 3 refills | Status: AC

## 2023-09-23 MED ORDER — LANTUS SOLOSTAR 100 UNIT/ML ~~LOC~~ SOPN: 30.0000 [IU] | PEN_INJECTOR | Freq: Every day | SUBCUTANEOUS | 3 refills | Status: AC

## 2023-09-23 NOTE — Patient Instructions (Signed)
 Continue Metformin  1000 mg twice daily  Decrease Lantus   to 30  units , once daily  Increase Glipizide  10 mg, 2 tablet before Breakfast and 2 tablet before Supper Increase Jardiance  25 mg, 1 tablet every morning     HOW TO TREAT LOW BLOOD SUGARS (Blood sugar LESS THAN 70 MG/DL) Please follow the RULE OF 15 for the treatment of hypoglycemia treatment (when your (blood sugars are less than 70 mg/dL)   STEP 1: Take 15 grams of carbohydrates when your blood sugar is low, which includes:  3-4 GLUCOSE TABS  OR 3-4 OZ OF JUICE OR REGULAR SODA OR ONE TUBE OF GLUCOSE GEL    STEP 2: RECHECK blood sugar in 15 MINUTES STEP 3: If your blood sugar is still low at the 15 minute recheck --> then, go back to STEP 1 and treat AGAIN with another 15 grams of carbohydrates.

## 2023-09-23 NOTE — Progress Notes (Signed)
 Name: Alyssa Warren  MRN/ DOB: [id number], 11-Jun-1970   Age/ Sex: 53 y.o., female    PCP: Judithann Novas, MD   Reason for Endocrinology Evaluation: Type 2 Diabetes Mellitus     Date of Initial Endocrinology Visit: 01/18/2022    PATIENT IDENTIFIER: Alyssa Warren is a 53 y.o. female with a past medical history of T2DM, HTN and Dyslipidemia . The patient presented for initial endocrinology clinic visit on 01/18/2022 for consultative assistance with her diabetes management.    HPI: Alyssa Warren was    Diagnosed with Gestational DM 2001,and  2009 by 2014 she was diagnosed with T2DM Prior Medications tried/Intolerance: Victoza  - N/V . Jardiance  - persistent hyperglycemia             Hemoglobin A1c has ranged from 7.0% in 2016, peaking at 11.6% in 2023.  She had vomiting for ~ 2 yrs due to Victoza , rare vomiting since than    On her initial visit to our clinic she had an A1c of 11.6%, she was on metformin , and insulin , due to hardship on the patient we opted to add glipizide  and increase her basal insulin    SUBJECTIVE:   During the last visit (07/02/2022): A1c 11.6%    Today (09/23/23): Alyssa Warren is here for follow-up on diabetes management .She has NOT been to our clinic in 14 months. She checks her blood sugars occasionally  times occasionally.   She follows with podiatry, was last seen 01/2023 for ingrown toenail  Denies nausea or vomiting , on PPI  Denies constipation or diarrhea    HOME DIABETES REGIMEN: Metformin  1000 mg BID  Glipizide  10 mg twice daily ( lunch and supper) Jardiance  10 mg daily  Lantus   34 units daily  Atorvastatin  40 mg daily     Statin: yes ACE-I/ARB: yes Prior Diabetic Education: yes    METER DOWNLOAD SUMMARY: Date range evaluated: 5/1-5/30/2025 Fingerstick Blood Glucose Tests = 6 Overall Mean FS Glucose = 131 Standard Deviation = 41  BG Ranges: Low = 81 High = 193   Hypoglycemic Events/30 Days: BG < 50 =  0 Episodes of symptomatic severe hypoglycemia = 0   DIABETIC COMPLICATIONS: Microvascular complications:   Denies: CKD, neuropathy  Last eye exam: Completed 01/28/2022  Macrovascular complications:   Denies: CAD, PVD, CVA   PAST HISTORY: Past Medical History:  Past Medical History:  Diagnosis Date   Allergy    Arthritis    LEFT KNEE   Diabetes mellitus without complication (HCC)    GERD (gastroesophageal reflux disease)    OCC   History of kidney stones    H/O   Hypertension    MVP (mitral valve prolapse)    PCOS (polycystic ovarian syndrome)    Past Surgical History:  Past Surgical History:  Procedure Laterality Date   BREAST BIOPSY Right 02/2012   x2. benign   COLONOSCOPY WITH PROPOFOL  N/A 09/07/2021   Procedure: COLONOSCOPY WITH PROPOFOL ;  Surgeon: Luke Salaam, MD;  Location: [facility] ENDOSCOPY;  Service: Gastroenterology;  Laterality: N/A;   cyst removed from ovary     CYSTOSCOPY  11/01/2017   Procedure: CYSTOSCOPY;  Surgeon: Alben Alma, MD;  Location: [facility] ORS;  Service: Gynecology;;   DILITATION & CURRETTAGE/HYSTROSCOPY WITH NOVASURE ABLATION N/A 12/23/2016   Procedure: DILATATION & CURETTAGE/HYSTEROSCOPY ENDOMETRIAL ABLATION-MINERVA;  Surgeon: Alben Alma, MD;  Location: [facility] ORS;  Service: Gynecology;  Laterality: N/A;   ESOPHAGOGASTRODUODENOSCOPY (EGD) WITH PROPOFOL  N/A 09/01/2020   Procedure: ESOPHAGOGASTRODUODENOSCOPY (EGD) WITH PROPOFOL ;  Surgeon:  Luke Salaam, MD;  Location: [facility] ENDOSCOPY;  Service: Gastroenterology;  Laterality: N/A;   KNEE SURGERY Left    LABIOPLASTY N/A 07/12/2017   Procedure: EXCISION OF LABIAL CYST;  Surgeon: Alben Alma, MD;  Location: [facility] ORS;  Service: Gynecology;  Laterality: N/A;   LAPAROSCOPIC HYSTERECTOMY Bilateral 11/01/2017   Procedure: HYSTERECTOMY TOTAL LAPAROSCOPIC BILATERAL SALPINGECTOMY, RIGHT OVARIAN CYSTECTOMY;  Surgeon: Alben Alma, MD;  Location: [facility] ORS;  Service: Gynecology;  Laterality: Bilateral;    LAPAROSCOPIC SALPINGO OOPHERECTOMY Left 12/23/2016   Procedure: LAPAROSCOPIC SALPINGO OOPHORECTOMY;  Surgeon: Alben Alma, MD;  Location: [facility] ORS;  Service: Gynecology;  Laterality: Left;   plantar fasciitis release Bilateral    ROTATOR CUFF REPAIR Right    TUBAL LIGATION      Social History:  reports that she has been smoking cigarettes. She has a 3.8 pack-year smoking history. She has never used smokeless tobacco. She reports that she does not currently use alcohol. She reports that she does not use drugs. Family History:  Family History  Problem Relation Age of Onset   Breast cancer Maternal Aunt        great Aunts    Breast cancer Paternal Aunt    Heart attack Brother      HOME MEDICATIONS: Allergies as of 09/23/2023       Reactions   Penicillins Hives, Itching   Has patient had a PCN reaction causing immediate rash, facial/tongue/throat swelling, SOB or lightheadedness with hypotension: Yes Has patient had a PCN reaction causing severe rash involving mucus membranes or skin necrosis: No Has patient had a PCN reaction that required hospitalization: No Has patient had a PCN reaction occurring within the last 10 years: No If all of the above answers are "NO", then may proceed with Cephalosporin use.   Keflex  [cephalexin ] Other (See Comments)   Makes loopy and feels like passing out   Methyldopa Other (See Comments)   Feels like passing out         Medication List        Accurate as of Sep 23, 2023  1:25 PM. If you have any questions, ask your nurse or doctor.          acetaminophen  500 MG tablet Commonly known as: TYLENOL  Take 1,000 mg by mouth 2 (two) times daily as needed for moderate pain.   atorvastatin  40 MG tablet Commonly known as: LIPITOR TAKE 1 TABLET(40 MG) BY MOUTH DAILY   empagliflozin  10 MG Tabs tablet Commonly known as: Jardiance  Take 1 tablet (10 mg total) by mouth daily.   glipiZIDE  10 MG tablet Commonly known as: GLUCOTROL  Take 1  tablet (10 mg total) by mouth 2 (two) times daily before a meal.   glucose blood test strip Commonly known as: ONE TOUCH ULTRA TEST Use to check blood sugar one to two times daily.  Dx: E11.9   Lantus  SoloStar 100 UNIT/ML Solostar Pen Generic drug: insulin  glargine Inject 32 Units into the skin daily.   losartan -hydrochlorothiazide 50-12.5 MG tablet Commonly known as: HYZAAR TAKE 1 TABLET BY MOUTH DAILY   metFORMIN  1000 MG tablet Commonly known as: GLUCOPHAGE  Take 1 tablet (1,000 mg total) by mouth 2 (two) times daily with a meal.   multivitamin with minerals Tabs tablet Take 2 tablets by mouth daily with lunch.   ondansetron  4 MG disintegrating tablet Commonly known as: Zofran  ODT Take 1 tablet (4 mg total) by mouth every 8 (eight) hours as needed for nausea or vomiting.   ONE TOUCH  ULTRA SYSTEM KIT w/Device Kit Use to check blood sugar one to two times daily.  Dx: E11.9   OneTouch Delica Lancets 33G Misc Use to check blood sugar one to two times daily.  Dx: E11.9   pantoprazole  40 MG tablet Commonly known as: PROTONIX  Take 1 tablet (40 mg total) by mouth daily.   Pen Needles 31G X 5 MM Misc 1 Device by Does not apply route daily in the afternoon.   venlafaxine  XR 37.5 MG 24 hr capsule Commonly known as: EFFEXOR -XR TAKE 1 CAPSULE(37.5 MG) BY MOUTH DAILY         ALLERGIES: Allergies  Allergen Reactions   Penicillins Hives and Itching    Has patient had a PCN reaction causing immediate rash, facial/tongue/throat swelling, SOB or lightheadedness with hypotension: Yes Has patient had a PCN reaction causing severe rash involving mucus membranes or skin necrosis: No Has patient had a PCN reaction that required hospitalization: No Has patient had a PCN reaction occurring within the last 10 years: No If all of the above answers are "NO", then may proceed with Cephalosporin use.    Keflex  [Cephalexin ] Other (See Comments)    Makes loopy and feels like passing out    Methyldopa Other (See Comments)    Feels like passing out      REVIEW OF SYSTEMS: A comprehensive ROS was conducted with the patient and is negative except as per HPI   OBJECTIVE:   VITAL SIGNS: BP 126/78 (BP Location: Left Arm, Patient Position: Sitting, Cuff Size: Normal)   Pulse 89   Ht 5' 5.75" (1.67 m)   Wt 159 lb (72.1 kg)   LMP 10/12/2017 Comment: upreg - neg  SpO2 99%   BMI 25.86 kg/m    PHYSICAL EXAM:  General: Pt appears well and is in NAD  Lungs: Clear with good BS bilat   Heart: RRR   Abdomen:  soft, nontender  Extremities:  Lower extremities - No pretibial edema. No lesions.  Neuro: MS is good with appropriate affect, pt is alert and Ox3    DM foot exam: 09/23/2023  The skin of the feet is intact without sores or ulcerations. The pedal pulses are 2+ on right and 2+ on left. The sensation is intact to a screening 5.07, 10 gram monofilament bilaterally     DATA REVIEWED:  Lab Results  Component Value Date   HGBA1C 8.4 (A) 09/23/2023   HGBA1C 9.7 (H) 12/03/2022   HGBA1C 9.9 (A) 07/02/2022   Lab Results  Component Value Date   MICROALBUR 2.2 (H) 12/03/2022   LDLCALC 26 06/29/2013   CREATININE 0.70 12/03/2022   Lab Results  Component Value Date   MICRALBCREAT 2.2 12/03/2022    Lab Results  Component Value Date   CHOL 242 (H) 12/03/2022   HDL 39.70 12/03/2022   LDLCALC 26 06/29/2013   LDLDIRECT 117.0 12/03/2022   TRIG (H) 12/03/2022    544.0 Triglyceride is over 400; calculations on Lipids are invalid.   CHOLHDL 6 12/03/2022        ASSESSMENT / PLAN / RECOMMENDATIONS:   1) Type 2 Diabetes Mellitus, Poorly controlled, With neuropathic  complications - Most recent A1c of  8.4%. Goal A1c < 7.0 %.    A1c is trending down but remains above goal intolerant to Victoza  due to severe vomiting, we did entertain Mounjaro Will increase Jardiance  Patient has been noted with a tight BG of 81 Mg/DL will decrease Lantus  as below, patient advised not to  self adjust  Lantus  I will increase glipizide  as below, she forgets to take it with breakfast so she has been taking at lunch and dinner A prescription for Dexcom has been sent to the pharmacy She was encouraged to contact our office with hypoglycemia to adjust her medications    MEDICATIONS: - Continue metformin  1000 mg twice daily -Increase Glipizide  10 mg, 2 tablet before lunch and supper -Increase Jardiance  25 mg daily - Decrease Lantus  30 units daily  EDUCATION / INSTRUCTIONS: BG monitoring instructions: Patient is instructed to check her blood sugars 2 times a day, fasting and supper time . Call [place] [facility] if: BG persistently < 70  I reviewed the Rule of 15 for the treatment of hypoglycemia in detail with the patient. Literature supplied.   2) Diabetic complications:  Eye: Does not have known diabetic retinopathy.  Neuro/ Feet: Does  have known diabetic peripheral neuropathy following a foot sx Renal: Patient does not have known baseline CKD. She is  on an ACEI/ARB at present.     3) Mixed Dyslipidemia:   - She has been noted with hypertriglyceridemia with a max 1595 mg/dL in 9147 - LDL continues to be above goal 11/2022 - Fenofibrate  caused joint pains  - She has not been consistent with fish oil intake - Encourage low-fat diet - Went detain the idea of switching to rosuvastatin should her LDL remain above goal  Continue   Atorvastatin  40 mg daily   Follow-up in 4 months   Signed electronically by: Natale Bail, MD  [facility] Endocrinology  [facility] [address] [place] [place], [place] [address] Phone: [phone] FAX: [phone]   CC: Judithann Novas, MD [address] [place] [address] Phone: [phone]  Fax: [phone]    Return to Endocrinology clinic as below: No future appointments.

## 2024-01-20 ENCOUNTER — Other Ambulatory Visit: Payer: Self-pay | Admitting: Family Medicine

## 2024-01-20 DIAGNOSIS — E781 Pure hyperglyceridemia: Secondary | ICD-10-CM

## 2024-01-20 NOTE — Telephone Encounter (Signed)
Please schedule CPE with fasting labs prior with Dr. Bedsole.  

## 2024-01-22 ENCOUNTER — Other Ambulatory Visit: Payer: Self-pay | Admitting: Family Medicine

## 2024-01-22 DIAGNOSIS — R002 Palpitations: Secondary | ICD-10-CM

## 2024-02-14 ENCOUNTER — Telehealth: Payer: Self-pay | Admitting: *Deleted

## 2024-02-14 DIAGNOSIS — E1165 Type 2 diabetes mellitus with hyperglycemia: Secondary | ICD-10-CM

## 2024-02-14 DIAGNOSIS — E1169 Type 2 diabetes mellitus with other specified complication: Secondary | ICD-10-CM

## 2024-02-14 NOTE — Telephone Encounter (Signed)
-----   Message from Veva JINNY Ferrari sent at 02/14/2024  3:48 PM EDT ----- Regarding: Lab orders for [facility], 10.30.25 Patient is scheduled for CPX labs, please order future labs, Thanks , Veva

## 2024-02-23 ENCOUNTER — Other Ambulatory Visit

## 2024-02-28 ENCOUNTER — Encounter: Admitting: Family Medicine

## 2024-03-06 ENCOUNTER — Other Ambulatory Visit: Payer: Self-pay | Admitting: Family Medicine

## 2024-03-07 ENCOUNTER — Other Ambulatory Visit: Payer: Self-pay | Admitting: *Deleted

## 2024-03-07 MED ORDER — PANTOPRAZOLE SODIUM 40 MG PO TBEC
40.0000 mg | DELAYED_RELEASE_TABLET | Freq: Every day | ORAL | 0 refills | Status: AC
Start: 2024-03-07 — End: ?

## 2024-03-07 NOTE — Telephone Encounter (Signed)
 Protonix  refilled.   Randine notified via telephone.

## 2024-03-07 NOTE — Telephone Encounter (Signed)
 Copied from CRM [id number]. Topic: Clinical - Medication Question >> Mar 07, 2024 10:00 AM Burnard DEL wrote: Reason for CRM: Patient called in stating that she is scheduled for January for her annual physical,and would like to know if she could have her pantoprazole  (PROTONIX ) 40 MG tablet medication refilled until then? She stated that she had to reschedule her CPE  due to having to work because someone is out on medical leave.

## 2024-04-23 ENCOUNTER — Other Ambulatory Visit: Payer: Self-pay | Admitting: Family Medicine

## 2024-04-23 DIAGNOSIS — E781 Pure hyperglyceridemia: Secondary | ICD-10-CM

## 2024-04-23 DIAGNOSIS — R002 Palpitations: Secondary | ICD-10-CM

## 2024-05-14 ENCOUNTER — Other Ambulatory Visit

## 2024-05-25 ENCOUNTER — Encounter: Admitting: Family Medicine
